# Patient Record
Sex: Female | Born: 1975
Health system: Southern US, Community
[De-identification: ages and names within clinical notes are randomized; demographics above are authoritative.]

## PROBLEM LIST (undated history)

## (undated) DIAGNOSIS — F329 Major depressive disorder, single episode, unspecified: Secondary | ICD-10-CM

## (undated) DIAGNOSIS — F32A Depression, unspecified: Secondary | ICD-10-CM

## (undated) DIAGNOSIS — K5790 Diverticulosis of intestine, part unspecified, without perforation or abscess without bleeding: Secondary | ICD-10-CM

## (undated) HISTORY — PX: ROTATOR CUFF REPAIR: SHX139

## (undated) HISTORY — PX: ABDOMINAL HYSTERECTOMY: SHX81

---

## 1999-11-04 ENCOUNTER — Emergency Department (HOSPITAL_COMMUNITY): Admission: EM | Admit: 1999-11-04 | Discharge: 1999-11-04 | Payer: Self-pay | Admitting: Emergency Medicine

## 2002-08-21 ENCOUNTER — Encounter: Admission: RE | Admit: 2002-08-21 | Discharge: 2002-08-21 | Payer: Self-pay | Admitting: Family Medicine

## 2002-08-28 ENCOUNTER — Encounter: Admission: RE | Admit: 2002-08-28 | Discharge: 2002-08-28 | Payer: Self-pay | Admitting: Family Medicine

## 2002-10-06 ENCOUNTER — Encounter: Admission: RE | Admit: 2002-10-06 | Discharge: 2002-10-06 | Payer: Self-pay | Admitting: Family Medicine

## 2002-10-15 ENCOUNTER — Ambulatory Visit (HOSPITAL_COMMUNITY): Admission: RE | Admit: 2002-10-15 | Discharge: 2002-10-15 | Payer: Self-pay | Admitting: Family Medicine

## 2002-10-23 ENCOUNTER — Inpatient Hospital Stay (HOSPITAL_COMMUNITY): Admission: AD | Admit: 2002-10-23 | Discharge: 2002-10-23 | Payer: Self-pay | Admitting: Obstetrics & Gynecology

## 2002-11-06 ENCOUNTER — Encounter: Admission: RE | Admit: 2002-11-06 | Discharge: 2002-11-06 | Payer: Self-pay | Admitting: Family Medicine

## 2002-11-12 ENCOUNTER — Inpatient Hospital Stay (HOSPITAL_COMMUNITY): Admission: AD | Admit: 2002-11-12 | Discharge: 2002-11-13 | Payer: Self-pay | Admitting: Obstetrics & Gynecology

## 2002-11-12 ENCOUNTER — Encounter: Payer: Self-pay | Admitting: Family Medicine

## 2002-12-04 ENCOUNTER — Encounter: Admission: RE | Admit: 2002-12-04 | Discharge: 2002-12-04 | Payer: Self-pay | Admitting: Family Medicine

## 2002-12-18 ENCOUNTER — Encounter: Admission: RE | Admit: 2002-12-18 | Discharge: 2002-12-18 | Payer: Self-pay | Admitting: Family Medicine

## 2002-12-25 ENCOUNTER — Encounter: Admission: RE | Admit: 2002-12-25 | Discharge: 2002-12-25 | Payer: Self-pay | Admitting: Sports Medicine

## 2003-01-04 ENCOUNTER — Encounter: Admission: RE | Admit: 2003-01-04 | Discharge: 2003-01-04 | Payer: Self-pay | Admitting: Family Medicine

## 2003-01-05 ENCOUNTER — Encounter: Admission: RE | Admit: 2003-01-05 | Discharge: 2003-01-05 | Payer: Self-pay | Admitting: Sports Medicine

## 2003-01-20 ENCOUNTER — Encounter: Admission: RE | Admit: 2003-01-20 | Discharge: 2003-01-20 | Payer: Self-pay | Admitting: Family Medicine

## 2003-02-01 ENCOUNTER — Encounter: Admission: RE | Admit: 2003-02-01 | Discharge: 2003-02-01 | Payer: Self-pay | Admitting: Family Medicine

## 2003-02-15 ENCOUNTER — Encounter: Admission: RE | Admit: 2003-02-15 | Discharge: 2003-02-15 | Payer: Self-pay | Admitting: Family Medicine

## 2003-02-23 ENCOUNTER — Encounter: Admission: RE | Admit: 2003-02-23 | Discharge: 2003-02-23 | Payer: Self-pay | Admitting: Family Medicine

## 2003-03-02 ENCOUNTER — Encounter: Admission: RE | Admit: 2003-03-02 | Discharge: 2003-03-02 | Payer: Self-pay | Admitting: Sports Medicine

## 2003-03-05 ENCOUNTER — Ambulatory Visit (HOSPITAL_COMMUNITY): Admission: RE | Admit: 2003-03-05 | Discharge: 2003-03-05 | Payer: Self-pay | Admitting: Family Medicine

## 2003-03-08 ENCOUNTER — Encounter: Admission: RE | Admit: 2003-03-08 | Discharge: 2003-03-08 | Payer: Self-pay | Admitting: Family Medicine

## 2003-03-16 ENCOUNTER — Encounter: Admission: RE | Admit: 2003-03-16 | Discharge: 2003-03-16 | Payer: Self-pay | Admitting: Sports Medicine

## 2003-03-18 ENCOUNTER — Inpatient Hospital Stay (HOSPITAL_COMMUNITY): Admission: AD | Admit: 2003-03-18 | Discharge: 2003-03-22 | Payer: Self-pay | Admitting: Obstetrics and Gynecology

## 2003-03-19 ENCOUNTER — Encounter (INDEPENDENT_AMBULATORY_CARE_PROVIDER_SITE_OTHER): Payer: Self-pay

## 2003-03-23 ENCOUNTER — Encounter: Admission: RE | Admit: 2003-03-23 | Discharge: 2003-03-23 | Payer: Self-pay | Admitting: Family Medicine

## 2003-03-25 ENCOUNTER — Encounter: Admission: RE | Admit: 2003-03-25 | Discharge: 2003-03-25 | Payer: Self-pay | Admitting: Family Medicine

## 2003-03-30 ENCOUNTER — Encounter: Admission: RE | Admit: 2003-03-30 | Discharge: 2003-03-30 | Payer: Self-pay | Admitting: Family Medicine

## 2003-03-31 ENCOUNTER — Encounter: Admission: RE | Admit: 2003-03-31 | Discharge: 2003-03-31 | Payer: Self-pay | Admitting: Family Medicine

## 2003-04-02 ENCOUNTER — Encounter: Admission: RE | Admit: 2003-04-02 | Discharge: 2003-04-02 | Payer: Self-pay | Admitting: Family Medicine

## 2003-04-05 ENCOUNTER — Encounter: Admission: RE | Admit: 2003-04-05 | Discharge: 2003-04-05 | Payer: Self-pay | Admitting: Family Medicine

## 2003-04-09 ENCOUNTER — Encounter: Admission: RE | Admit: 2003-04-09 | Discharge: 2003-05-09 | Payer: Self-pay | Admitting: Sports Medicine

## 2003-04-16 ENCOUNTER — Encounter: Admission: RE | Admit: 2003-04-16 | Discharge: 2003-04-16 | Payer: Self-pay | Admitting: Family Medicine

## 2003-05-03 ENCOUNTER — Encounter: Admission: RE | Admit: 2003-05-03 | Discharge: 2003-05-03 | Payer: Self-pay | Admitting: Family Medicine

## 2003-05-10 ENCOUNTER — Encounter: Admission: RE | Admit: 2003-05-10 | Discharge: 2003-06-09 | Payer: Self-pay | Admitting: Sports Medicine

## 2003-06-01 ENCOUNTER — Encounter: Admission: RE | Admit: 2003-06-01 | Discharge: 2003-06-01 | Payer: Self-pay | Admitting: Family Medicine

## 2003-10-11 ENCOUNTER — Encounter: Admission: RE | Admit: 2003-10-11 | Discharge: 2003-10-11 | Payer: Self-pay | Admitting: Family Medicine

## 2004-07-19 ENCOUNTER — Encounter (INDEPENDENT_AMBULATORY_CARE_PROVIDER_SITE_OTHER): Payer: Self-pay | Admitting: *Deleted

## 2004-07-19 ENCOUNTER — Ambulatory Visit: Payer: Self-pay | Admitting: Family Medicine

## 2004-08-08 ENCOUNTER — Ambulatory Visit: Payer: Self-pay | Admitting: Family Medicine

## 2004-08-22 ENCOUNTER — Ambulatory Visit: Payer: Self-pay | Admitting: Family Medicine

## 2005-02-28 ENCOUNTER — Ambulatory Visit: Payer: Self-pay | Admitting: Family Medicine

## 2005-02-28 ENCOUNTER — Encounter (INDEPENDENT_AMBULATORY_CARE_PROVIDER_SITE_OTHER): Payer: Self-pay | Admitting: *Deleted

## 2005-05-10 ENCOUNTER — Ambulatory Visit: Payer: Self-pay | Admitting: Family Medicine

## 2005-10-01 ENCOUNTER — Encounter (INDEPENDENT_AMBULATORY_CARE_PROVIDER_SITE_OTHER): Payer: Self-pay | Admitting: Specialist

## 2005-10-01 ENCOUNTER — Ambulatory Visit: Payer: Self-pay | Admitting: Family Medicine

## 2006-04-02 ENCOUNTER — Encounter (INDEPENDENT_AMBULATORY_CARE_PROVIDER_SITE_OTHER): Payer: Self-pay | Admitting: *Deleted

## 2006-04-02 LAB — CONVERTED CEMR LAB

## 2006-04-09 ENCOUNTER — Encounter (INDEPENDENT_AMBULATORY_CARE_PROVIDER_SITE_OTHER): Payer: Self-pay | Admitting: *Deleted

## 2006-04-09 ENCOUNTER — Ambulatory Visit: Payer: Self-pay | Admitting: Family Medicine

## 2006-04-24 ENCOUNTER — Ambulatory Visit: Payer: Self-pay | Admitting: Family Medicine

## 2006-05-23 DIAGNOSIS — G43909 Migraine, unspecified, not intractable, without status migrainosus: Secondary | ICD-10-CM | POA: Insufficient documentation

## 2006-05-23 DIAGNOSIS — E739 Lactose intolerance, unspecified: Secondary | ICD-10-CM | POA: Insufficient documentation

## 2006-05-24 ENCOUNTER — Encounter (INDEPENDENT_AMBULATORY_CARE_PROVIDER_SITE_OTHER): Payer: Self-pay | Admitting: *Deleted

## 2006-05-28 ENCOUNTER — Ambulatory Visit: Payer: Self-pay | Admitting: Family Medicine

## 2006-05-28 DIAGNOSIS — E669 Obesity, unspecified: Secondary | ICD-10-CM | POA: Insufficient documentation

## 2006-05-28 DIAGNOSIS — R5383 Other fatigue: Secondary | ICD-10-CM | POA: Insufficient documentation

## 2006-05-28 DIAGNOSIS — R030 Elevated blood-pressure reading, without diagnosis of hypertension: Secondary | ICD-10-CM | POA: Insufficient documentation

## 2006-05-28 DIAGNOSIS — R5381 Other malaise: Secondary | ICD-10-CM | POA: Insufficient documentation

## 2006-07-10 ENCOUNTER — Telehealth (INDEPENDENT_AMBULATORY_CARE_PROVIDER_SITE_OTHER): Payer: Self-pay | Admitting: *Deleted

## 2006-08-23 ENCOUNTER — Ambulatory Visit: Payer: Self-pay | Admitting: Family Medicine

## 2006-08-23 DIAGNOSIS — L659 Nonscarring hair loss, unspecified: Secondary | ICD-10-CM | POA: Insufficient documentation

## 2006-08-27 ENCOUNTER — Encounter: Payer: Self-pay | Admitting: *Deleted

## 2006-10-31 ENCOUNTER — Encounter (INDEPENDENT_AMBULATORY_CARE_PROVIDER_SITE_OTHER): Payer: Self-pay | Admitting: *Deleted

## 2006-12-24 ENCOUNTER — Telehealth: Payer: Self-pay | Admitting: *Deleted

## 2007-04-04 ENCOUNTER — Ambulatory Visit: Payer: Self-pay | Admitting: Family Medicine

## 2007-04-04 DIAGNOSIS — F321 Major depressive disorder, single episode, moderate: Secondary | ICD-10-CM | POA: Insufficient documentation

## 2007-04-07 ENCOUNTER — Encounter (INDEPENDENT_AMBULATORY_CARE_PROVIDER_SITE_OTHER): Payer: Self-pay | Admitting: *Deleted

## 2007-06-30 ENCOUNTER — Ambulatory Visit: Payer: Self-pay | Admitting: Family Medicine

## 2007-07-14 ENCOUNTER — Telehealth (INDEPENDENT_AMBULATORY_CARE_PROVIDER_SITE_OTHER): Payer: Self-pay | Admitting: *Deleted

## 2007-07-14 ENCOUNTER — Encounter (INDEPENDENT_AMBULATORY_CARE_PROVIDER_SITE_OTHER): Payer: Self-pay | Admitting: *Deleted

## 2007-07-14 ENCOUNTER — Ambulatory Visit: Payer: Self-pay | Admitting: Sports Medicine

## 2007-07-14 ENCOUNTER — Encounter: Payer: Self-pay | Admitting: *Deleted

## 2007-07-14 LAB — CONVERTED CEMR LAB: TSH: 1.205 microintl units/mL (ref 0.350–5.50)

## 2007-07-15 ENCOUNTER — Encounter (INDEPENDENT_AMBULATORY_CARE_PROVIDER_SITE_OTHER): Payer: Self-pay | Admitting: *Deleted

## 2007-12-10 ENCOUNTER — Ambulatory Visit: Payer: Self-pay | Admitting: Family Medicine

## 2007-12-10 ENCOUNTER — Telehealth: Payer: Self-pay | Admitting: *Deleted

## 2008-02-16 ENCOUNTER — Ambulatory Visit: Payer: Self-pay | Admitting: Family Medicine

## 2008-02-16 ENCOUNTER — Telehealth: Payer: Self-pay | Admitting: *Deleted

## 2008-05-12 ENCOUNTER — Telehealth (INDEPENDENT_AMBULATORY_CARE_PROVIDER_SITE_OTHER): Payer: Self-pay | Admitting: Family Medicine

## 2008-06-11 ENCOUNTER — Ambulatory Visit: Payer: Self-pay | Admitting: Family Medicine

## 2008-06-11 DIAGNOSIS — N898 Other specified noninflammatory disorders of vagina: Secondary | ICD-10-CM | POA: Insufficient documentation

## 2008-08-04 ENCOUNTER — Ambulatory Visit: Payer: Self-pay | Admitting: Family Medicine

## 2008-08-04 ENCOUNTER — Encounter: Payer: Self-pay | Admitting: Family Medicine

## 2008-08-04 DIAGNOSIS — R87610 Atypical squamous cells of undetermined significance on cytologic smear of cervix (ASC-US): Secondary | ICD-10-CM | POA: Insufficient documentation

## 2008-08-04 LAB — CONVERTED CEMR LAB
BUN: 14 mg/dL (ref 6–23)
CO2: 22 meq/L (ref 19–32)
Calcium: 8.9 mg/dL (ref 8.4–10.5)
Chlamydia, DNA Probe: NEGATIVE
Chloride: 104 meq/L (ref 96–112)
Creatinine, Ser: 0.78 mg/dL (ref 0.40–1.20)
GC Probe Amp, Genital: NEGATIVE
Glucose, Bld: 139 mg/dL — ABNORMAL HIGH (ref 70–99)
Potassium: 4.2 meq/L (ref 3.5–5.3)
Sodium: 140 meq/L (ref 135–145)

## 2008-08-13 ENCOUNTER — Encounter: Payer: Self-pay | Admitting: Family Medicine

## 2008-08-18 ENCOUNTER — Telehealth: Payer: Self-pay | Admitting: Family Medicine

## 2009-05-23 ENCOUNTER — Ambulatory Visit: Payer: Self-pay | Admitting: Family Medicine

## 2009-05-23 ENCOUNTER — Encounter: Payer: Self-pay | Admitting: Family Medicine

## 2009-05-23 LAB — CONVERTED CEMR LAB: Rapid Strep: NEGATIVE

## 2009-08-09 ENCOUNTER — Ambulatory Visit: Payer: Self-pay | Admitting: Family Medicine

## 2009-08-11 LAB — CONVERTED CEMR LAB: Pap Smear: HIGH

## 2009-08-18 ENCOUNTER — Ambulatory Visit: Payer: Self-pay | Admitting: Family Medicine

## 2009-08-18 DIAGNOSIS — R87613 High grade squamous intraepithelial lesion on cytologic smear of cervix (HGSIL): Secondary | ICD-10-CM | POA: Insufficient documentation

## 2009-08-24 ENCOUNTER — Telehealth: Payer: Self-pay | Admitting: Family Medicine

## 2009-08-25 ENCOUNTER — Encounter: Payer: Self-pay | Admitting: Family Medicine

## 2009-09-09 ENCOUNTER — Telehealth: Payer: Self-pay | Admitting: Family Medicine

## 2009-09-22 ENCOUNTER — Encounter: Payer: Self-pay | Admitting: Family Medicine

## 2009-10-27 ENCOUNTER — Encounter (INDEPENDENT_AMBULATORY_CARE_PROVIDER_SITE_OTHER): Payer: Self-pay | Admitting: Obstetrics and Gynecology

## 2009-10-27 ENCOUNTER — Ambulatory Visit (HOSPITAL_COMMUNITY): Admission: RE | Admit: 2009-10-27 | Discharge: 2009-10-28 | Payer: Self-pay | Admitting: Obstetrics and Gynecology

## 2009-12-21 ENCOUNTER — Encounter: Payer: Self-pay | Admitting: Family Medicine

## 2009-12-21 ENCOUNTER — Ambulatory Visit: Payer: Self-pay | Admitting: Family Medicine

## 2009-12-21 DIAGNOSIS — K644 Residual hemorrhoidal skin tags: Secondary | ICD-10-CM | POA: Insufficient documentation

## 2009-12-23 ENCOUNTER — Telehealth: Payer: Self-pay | Admitting: Family Medicine

## 2010-01-16 ENCOUNTER — Ambulatory Visit: Payer: Self-pay | Admitting: Family Medicine

## 2010-01-17 ENCOUNTER — Ambulatory Visit: Payer: Self-pay | Admitting: Family Medicine

## 2010-01-17 ENCOUNTER — Encounter: Payer: Self-pay | Admitting: Family Medicine

## 2010-01-17 LAB — CONVERTED CEMR LAB
Cholesterol: 204 mg/dL — ABNORMAL HIGH (ref 0–200)
HDL: 62 mg/dL (ref >39)
LDL Cholesterol: 115 mg/dL — ABNORMAL HIGH (ref 0–99)
Total CHOL/HDL Ratio: 3.3
Triglycerides: 136 mg/dL (ref <150)
VLDL: 27 mg/dL (ref 0–40)

## 2010-03-10 ENCOUNTER — Telehealth: Payer: Self-pay | Admitting: *Deleted

## 2010-04-25 NOTE — Progress Notes (Signed)
Summary: test results   Phone Note Call from Patient Call back at [phone]   Reason for Call: Lab or Test Results Summary of Call: pt is requesting results of colpo Initial call taken by: Knox Royalty,  August 24, 2009 2:58 PM  Follow-up for Phone Call        spoke w pt will refer gyn      Complete Medication List: 1)  Bupropion Hcl 150 Mg Xr24h-tab (Bupropion hcl) .... One by mouth in am

## 2010-04-25 NOTE — Progress Notes (Signed)
Summary: meds prob-medications completed   ------------------------------------------------------------------------------------------------------------------------------- Called and left message for patient that mediaction is ready for pickup. Doree Albee MD December 23, 2009 9:05 PM  Medications Added WELLBUTRIN XL 300 MG XR24H-TAB (BUPROPION HCL) 1 tab daily       Phone Note Refill Request Call back at Home Phone [phone] Message from:  Patient  Refills Requested: Medication #1:  BUPROPION HCL 150 MG XR24H-TAB one by mouth in AM didn't get meds from visit the other day, needs it today, please cream for hemmoroids [facility] [address]  Initial call taken by: De Nurse,  December 23, 2009 11:19 AM    New/Updated Medications: WELLBUTRIN XL 300 MG XR24H-TAB (BUPROPION HCL) 1 tab daily Prescriptions: WELLBUTRIN XL 300 MG XR24H-TAB (BUPROPION HCL) 1 tab daily  #30 x 6   Entered and Authorized by:   Doree Albee MD   Signed by:   Doree Albee MD on 12/23/2009   Method used:   Electronically to        [facility] [phone]* (retail)       [address]       [place], [place]  [address]       Ph: [phone]       Fax: [phone]   RxID:   [id number] HYDROCORTISONE 1 % CREA (HYDROCORTISONE) apply to affected area twice daily x 2 weeks  #1 tube qs x 0   Entered and Authorized by:   Doree Albee MD   Signed by:   Doree Albee MD on 12/23/2009   Method used:   Electronically to        [facility]. [phone]* (retail)       [address]       [place], [place]  [address]       Ph: [phone]       Fax: [phone]   RxID:   [id number]

## 2010-04-25 NOTE — Assessment & Plan Note (Signed)
Summary: sore throat,df   Vital Signs:  Patient profile:   35 year old female Weight:      220.9 pounds Temp:     98.2 degrees F oral Pulse rate:   90 / minute BP sitting:   119 / 85  (right arm)  Vitals Entered By: Arlyss Repress CMA, (May 23, 2009 10:19 AM) CC: sore throat since thursday Is Patient Diabetic? No Pain Assessment Patient in pain? no        Primary Care Provider:  Eustaquio Boyden  MD  CC:  sore throat since thursday.  History of Present Illness: patient c/o cold symptoms for two weeks, on thursday began with sore throat, pain with swallowing and dizziness.  Nonproductive cough that worsens at night associated with fever and chills that began on Saturday.  Has taken tylenol for fever, but has not taken any other medications.  Does not have a history of allergies or lung problems but does have hx of sinus infections.     Habits & Providers  Alcohol-Tobacco-Diet     Tobacco Status: never  Current Medications (verified): 1)  Mononessa 0.25-35 Mg-Mcg Tabs (Norgestimate-Eth Estradiol) .... 28tabs.  Take One By Mouth Daily As Directed 2)  Doxycycline Hyclate 100 Mg Caps (Doxycycline Hyclate) .... Take One Tab Two Times A Day By Mouth For Ten Days  Allergies (verified): 1)  ! Penicillin  Review of Systems General:  Complains of chills, fatigue, and fever; denies loss of appetite, malaise, and sweats. Eyes:  Denies discharge, eye irritation, and light sensitivity. ENT:  Complains of difficulty swallowing, sinus pressure, and sore throat; denies decreased hearing, ear discharge, earache, hoarseness, nasal congestion, nosebleeds, postnasal drainage, and ringing in ears. CV:  Denies chest pain or discomfort, difficulty breathing at night, lightheadness, palpitations, and shortness of breath with exertion. Resp:  Complains of cough; denies chest discomfort, chest pain with inspiration, shortness of breath, and sputum productive.  Physical Exam  General:   Well-developed,well-nourished,in no acute distress; alert,appropriate and cooperative throughout examination Eyes:  No corneal or conjunctival inflammation noted. EOMI. Perrla. Vision grossly normal. Ears:  External ear exam shows no significant lesions or deformities.  Otoscopic examination reveals clear canals, tympanic membranes are intact bilaterally without bulging, retraction, inflammation or discharge. Hearing is grossly normal bilaterally. Nose:  External nasal examination shows no deformity or inflammation. Nasal mucosa are pink and moist without lesions or exudates. Mouth:  Oral mucosa and oropharynx without lesions, exudate noted to left tonsil, bilateral tenderness.   Chest Wall:  No deformities, masses, or tenderness noted. Lungs:  Normal respiratory effort, chest expands symmetrically. Lungs are clear to auscultation, no crackles or wheezes. Heart:  Normal rate and regular rhythm. S1 and S2 normal without gallop, murmur, click, rub or other extra sounds. Neurologic:  alert & oriented X3.   Skin:  Intact without suspicious lesions or rashes Cervical Nodes:  L anterior LN tender.   Psych:  Oriented X3, memory intact for recent and remote, and normally interactive.     Impression & Recommendations:  Problem # 1:  PHARYNGITIS (ICD-462)  Exudate on tonsils and tenderness anterior left cervical lymph node, will treat for pharyngitis with patient instructions to return if symptoms are not improved. Her updated medication list for this problem includes:    Doxycycline Hyclate 100 Mg Caps (Doxycycline hyclate) .Marland Kitchen... Take one tab two times a day by mouth for ten days  Orders: [facility]- Est Level  3 ([address])  Complete Medication List: 1)  Mononessa 0.25-35 Mg-mcg Tabs (Norgestimate-eth  estradiol) .... 28tabs.  take one by mouth daily as directed 2)  Doxycycline Hyclate 100 Mg Caps (Doxycycline hyclate) .... Take one tab two times a day by mouth for ten days  Other Orders: Rapid Strep-[facility]  ([address])  Patient Instructions: 1)  Take antibiotics as prescribed. 2)  Get plenty of rest and fluids. 3)  May use throat spray of your choice. 4)  Return if symptoms persist or if worsen with treatment. Prescriptions: DOXYCYCLINE HYCLATE 100 MG CAPS (DOXYCYCLINE HYCLATE) take one tab two times a day by mouth for ten days  #20 x 0   Entered and Authorized by:   Luretha Murphy NP   Signed by:   Luretha Murphy NP on 05/23/2009   Method used:   Electronically to        [facility] [address] [phone]* (retail)       [address] [place], [place]  [address]       Ph: [phone]       Fax: [phone]   RxID:   [id number]   Laboratory Results  Date/Time Received: May 23, 2009 10:26 AM  Date/Time Reported: May 23, 2009 10:35 AM   Other Tests  Rapid Strep: negative Comments: ...........test performed by...........Marland KitchenTerese Door, CMA

## 2010-04-25 NOTE — Assessment & Plan Note (Signed)
Summary: meds/kh   Vital Signs:  Patient profile:   35 year old female Height:      66 inches Weight:      220.9 pounds BMI:     35.78 Temp:     98.0 degrees F oral Pulse rate:   92 / minute BP sitting:   122 / 87  (left arm) Cuff size:   regular  Vitals Entered By: Garen Grams LPN (December 21, 2009 1:52 PM) CC: f/u depression med, Depression Is Patient Diabetic? No Pain Assessment Patient in pain? no        Primary Care Provider:  Eustaquio Boyden  MD  CC:  f/u depression med and Depression.  History of Present Illness: 64 YOF w/ PMHx/o depression, hx/o HGSIL s/p hysterectomy here for new MD visit   Anal irritation: Pt reports anal irritation and pain since hysterectomy. Feels that it may be from hemorrhoids. Has done anal self check and has noticed "anal bumps" that were painful. Has used preparation H for pain with minimal improvement. No anal bleeding, BRBPR, hx/o inflammatory bowel disease, prolonged post-operative course per pt. Pain most prominent with exercising/siting for prolonged periods of time.   TAH: Pt s/p lap-hysterectomy 10/27/2009. Has had otherwise normal post-operative coarse to date. Ambulating well without incident/ no reported abdominal pain. Pt tentatively scheduled for followup in 1-2 months.    Depression History:      The patient denies a depressed mood most of the day and a diminished interest in her usual daily activities.  The patient denies insomnia, hypersomnia, fatigue (loss of energy), and feelings of worthlessness (guilt).        The patient denies that she feels like life is not worth living, denies that she wishes that she were dead, and denies that she has thought about ending her life.        Comments:  States that mood is improved from before starting wellbutrin but feels that overall boosted mood is beginning to wain. .   Habits & Providers  Alcohol-Tobacco-Diet     Tobacco Status: never  Allergies: 1)  ! Penicillin  Physical  Exam  General:  alert and well-developed.   Head:  normocephalic and atraumatic.   Nose:  no external deformity.   Mouth:  good dentition.   Neck:  supple and full ROM.   Lungs:  normal respiratory effort, normal breath sounds, and no wheezes.   Heart:  normal rate, regular rhythm, and no murmur.   Abdomen:  soft, non-tender, and normal bowel sounds.   Rectal:  external hemorrhoid(s), no internal hemorrhoids noted  Extremities:  2+ peripheral pulses, no edema  Neurologic:  alert & oriented X3.     Impression & Recommendations:  Problem # 1:  DEPRESSION, MAJOR, MODERATE (ICD-296.22) Assessment Deteriorated Plan to increase wellbutrin to max dosing to will plan to call pt in 2 weeks for mood check  as well as set up 1 month appt to reasses mood. Discussed psych red flags with patient (suicidality/homicidality). Pt agreeable to overall plan.   Orders: [facility]- Est  Level 4 ([address])  Problem # 2:  HEMORRHOIDS, EXTERNAL (ICD-455.3) Assessment: New Multiple small external hemorrhois noted on PE w/o  thrombosis. Plan to start pt on topical hydrocortisone cream for pain/inflammation. Uptodate patient handout on hemorrhoids also given to patient in terms of dietary and lifestyle changes. Addressed patient need for shorterm use of topical steroid (max 2 weeks of continued use) as to avoid secondary skin breakdown.   Orders: [facility]-  Est  Level 4 ([address])  Problem # 3:  PREVENTIVE HEALTH CARE (ICD-V70.0) Will order lipid panel in setting of baseline obesity.   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 Tolerated well with normal post operative course per patient. Tentaively scheduled for follow[facility] in the [facility] Will continue to follow.   Orders: [facility]- Est  Level 4 ([address])  Complete Medication List: 1)  Bupropion Hcl 150 Mg Xr24h-tab (Bupropion hcl) .... One by mouth in am 2)   Ortho-novum 1/35 (28) 1-35 Mg-mcg Tabs (Norethindrone-eth estradiol) .... Take one daily for birth control 3)  Hydrocortisone 1 % Crea (Hydrocortisone) .... Apply to affected area twice daily x 2 weeks  Other Orders: Future Orders: T-Lipid Profile ([address]11) ... 12/30/2009  Patient Instructions: 1)  It was good to meet you today 2)  We will be increasing your Buproprion for your mood 3)  You will also given a cream for your irritation(use as directed) 4)  as well as a patient handout 5)  Come back to see me in 11month about your mood 6)  Otherwise call for any questions 7)  God Bless,  8)  Doree Albee MD    Prevention & Chronic Care Immunizations   Influenza vaccine: Not documented    Tetanus booster: 06/24/2004: Done.   Tetanus booster due: 06/25/2014    Pneumococcal vaccine: Not documented  Other Screening   Pap smear: HIGH GRADE SQUAMOUS INTRAEPITHELIAL LESION: CIN-2/ VAIN-2/ CIN-3/ VAIN-3/CIS.  (08/09/2009)   Pap smear due: 08/04/2009   Smoking status: never  (12/21/2009)  Lipids   Total Cholesterol: Not documented   Lipid panel action/deferral: Lipid Panel ordered   LDL: Not documented   LDL Direct: Not documented   HDL: Not documented   Triglycerides: Not documented

## 2010-04-25 NOTE — Progress Notes (Signed)
Summary: phn msg  Medications Added ORTHO-NOVUM 1/35 (28) 1-35 MG-MCG TABS (NORETHINDRONE-ETH ESTRADIOL) take one daily for birth control       Phone Note Call from Patient Call back at Home Phone [phone]   Caller: Patient Summary of Call: Would like an rx called in for birth control.  Pharmacy is [facility] Initial call taken by: Clydell Hakim,  September 09, 2009 4:07 PM  Follow-up for Phone Call        called back to touch base - h/o not tolerating OCPs in past.  left message to call us back. Follow-up by: Eustaquio Boyden  MD,  September 12, 2009 10:26 AM  Additional Follow-up for Phone Call Additional follow up Details #1::        pt returned call Additional Follow-up by: De Nurse,  September 12, 2009 10:56 AM    Additional Follow-up for Phone Call Additional follow up Details #2::    spoke with patient.  pt would like ortho novum as she tolerated this well in the past.  sent to her pharmacy. Follow-up by: Eustaquio Boyden  MD,  September 12, 2009 1:37 PM  New/Updated Medications: ORTHO-NOVUM 1/35 (28) 1-35 MG-MCG TABS (NORETHINDRONE-ETH ESTRADIOL) take one daily for birth control Prescriptions: ORTHO-NOVUM 1/35 (28) 1-35 MG-MCG TABS (NORETHINDRONE-ETH ESTRADIOL) take one daily for birth control  #30 x 3   Entered and Authorized by:   Eustaquio Boyden  MD   Signed by:   Eustaquio Boyden  MD on 09/12/2009   Method used:   Electronically to        [facility] [phone]* (retail)       [address]       [place], [place]  [address]       Ph: [phone]       Fax: [phone]   RxID:   [id number]

## 2010-04-25 NOTE — Letter (Signed)
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 [facility] at [facility]. See attached note for full details. Doree Albee {DATETIMESTAMP()}

## 2010-04-25 NOTE — Assessment & Plan Note (Signed)
Summary: cpe/pap,tcb   Vital Signs:  Patient profile:   35 year old female Height:      66 inches Weight:      226 pounds BMI:     36.61 Pulse rate:   75 / minute BP sitting:   134 / 92  (right arm)  Vitals Entered By: Arlyss Repress CMA, (Aug 09, 2009 8:40 AM) CC: physical with pap Is Patient Diabetic? No Pain Assessment Patient in pain? no        Primary Care Provider:  Eustaquio Boyden  MD  CC:  physical with pap.  History of Present Illness: CC: cpe  1. BC - would like IUD again.  Off OCPs.  Fiance has vasectomy.  Would still like IUD to help regulate periods.  Had IUD several years ago but got dislodged so removed.  Tolerated well in past.  LMP 1 wk ago.  Usually heavy for 5 days.  2. pap - h/o abnormal paps in past, had LEEP 1990s, colposcopy 2005 with biopsy.  Last year pap returned ASCUS, no high risk HPV detected.  3. depression - previously on wellbutrin, worked well.  then stopped because taknig migraine meds, not anymore and would like to restart wellbutrin.  States depression returning.  Going through body issues and feeling down.  Habits & Providers  Alcohol-Tobacco-Diet     Tobacco Status: never  Current Medications (verified): 1)  Bupropion Hcl 150 Mg Xr24h-Tab (Bupropion Hcl) .... One By Mouth in Am  Allergies (verified): 1)  ! Penicillin  Past History:  Past medical, surgical, family and social histories (including risk factors) reviewed for relevance to current acute and chronic problems.  Past Medical History: Reviewed history from 12/10/2007 and no changes required. 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 none  Family History: Reviewed history from 08/04/2008 and no changes required. Diabetes in father and grandfather  Social History: Reviewed history from 08/04/2008 and  no changes required. Works as Associate Professor at [employer].  Exercises 3d/wk - aquatic aerobics and walking.  No tobacco or drugs. Occasional EtOH.  Cute Son Darene Lamer DOB 2004.  Lives with son and small dog.  feels safe at home.  Physical Exam  General:  Well-developed,well-nourished,in no acute distress; alert,appropriate and cooperative throughout examination Genitalia:  Pelvic Exam:        External: normal female genitalia without lesions or masses        Vagina: normal without lesions or masses        Cervix: normal without lesions or masses        Adnexa: normal bimanual exam without masses or fullness        Uterus: normal by palpation        Pap smear: performed   Impression & Recommendations:  Problem # 1:  SCREENING FOR MALIGNANT NEOPLASM OF THE CERVIX (ICD-V76.2)  Orders: Pap Smear-[facility] ([id number]) [facility] - Est  18-39 yrs ([address])  Problem # 2:  DEPRESSION, MAJOR, MODERATE (ICD-296.22)  restart wellbutrin.  return in 1 month for f/u.  Orders: [facility] - Est  18-39 yrs ([address])  Problem # 3:  FAMILY PLANNING (ICD-V25.09) would like IUD to help regularize periods.  did not tolerate OCPs.  Husband with vasectomy.  advised to schedule appt at [facility] clinic.  Complete Medication List: 1)  Bupropion Hcl 150 Mg Xr24h-tab (Bupropion hcl) .... One by mouth in  am  Patient Instructions: 1)  I will call you with results at [phone] of pap smear. 2)  Everything looked normal today.  If pap returns normal, we will need to repeat in 1 year.  If abnormal, we will send you for colposcopy. 3)  Schedule appointment at [facility] clinic for IUD insertion. 4)  Start Wellbutrin daily (prescription sent to [facility] [address]). 5)  Good to see you today. Prescriptions: BUPROPION HCL 150 MG XR24H-TAB (BUPROPION HCL) one by mouth in AM  #31 x 3   Entered and Authorized by:   Eustaquio Boyden  MD   Signed by:   Eustaquio Boyden  MD on 08/09/2009   Method used:   Electronically to         [facility]. [phone]* (retail)       [address]       [place], [place]  [address]       Ph: [phone]       Fax: [phone]   RxID:   [id number]   Appended Document: cpe/pap,tcb     Clinical Lists Changes  Observations: Added new observation of PEADULT: Eustaquio Boyden  MD ~Breasts`Breast Insp (08/09/2009 9:25) Added new observation of BREAST INSP: No mass, nodules, thickening, tenderness, bulging, retraction, inflamation, nipple discharge or skin changes noted.   (08/09/2009 9:25)        Physical Exam  Breasts:  No mass, nodules, thickening, tenderness, bulging, retraction, inflamation, nipple discharge or skin changes noted.

## 2010-04-25 NOTE — Letter (Signed)
Summary: Out of Work  [facility]  [address]   [place], [place] [address]   Phone: [phone]  Fax: [phone]    May 23, 2009   Employee:  LAYLANA GERWIG    To Whom It May Concern:   For Medical reasons, please excuse the above named employee from work for the following dates:  Start:    End:    If you need additional information, please feel free to contact our office.         Sincerely,    Luretha Murphy NP

## 2010-04-25 NOTE — Miscellaneous (Signed)
   Clinical Lists Changes  Medications: Rx of DOXYCYCLINE HYCLATE 100 MG CAPS (DOXYCYCLINE HYCLATE) take one tab two times a day by mouth for ten days;  #20 x 0;  Signed;  Entered by: Luretha Murphy NP;  Authorized by: Luretha Murphy NP;  Method used: Electronically to [facility]. [phone]*, [address] [place], [place]  [address], Ph: [phone], Fax: [phone]    Prescriptions: DOXYCYCLINE HYCLATE 100 MG CAPS (DOXYCYCLINE HYCLATE) take one tab two times a day by mouth for ten days  #20 x 0   Entered and Authorized by:   Luretha Murphy NP   Signed by:   Luretha Murphy NP on 05/23/2009   Method used:   Electronically to        [facility] [address] [phone]* (retail)       [address] [place], [place]  [address]       Ph: [phone]       Fax: [phone]   RxID:   [id number]

## 2010-04-25 NOTE — Miscellaneous (Signed)
Summary: Operative Report- Laparoscopic assisted vaginal hysterctomy    DATE OF PROCEDURE:  10/27/2009   DATE OF DISCHARGE:                                  OPERATIVE REPORT      PREOPERATIVE DIAGNOSIS:  Recurrent cervical dysplasia.      POSTOPERATIVE DIAGNOSIS:  Recurrent cervical dysplasia.      PROCEDURES:   1. Laparoscopic-assisted vaginal hysterectomy.   2. Lysis of adhesion.      SURGEON:  Michelle L. Vincente Poli, MD      ASSISTANTLuvenia Redden, MD      ANESTHESIA:  General.      Adhesion of left ovary to the posterior wall of the uterus.      SPECIMENS:  Uterus and cervix sent to Pathology.      ESTIMATED BLOOD LOSS:  200 mL.      COMPLICATIONS:  None.      DESCRIPTION OF PROCEDURE:  The patient was taken to the operating room.   She was then intubated.  She was prepped and draped.  A Foley catheter   was inserted and draining clear urine and the uterine manipulator was   inserted.  Attention was turned to the abdomen where a small incision   was made at the umbilicus.  The Veress needle was inserted and the   pneumoperitoneum was performed.  The 11-mm trocar was then inserted.   The laparoscope was introduced through the trocar sheaths.  Abdomen   appeared normal.  The patient was placed in Trendelenburg position and 5-   mm port was placed suprapubically under direct visualization.  Exam   revealed the following, no endometriosis, there was a small adhesion   involving the ovary to the posterior wall of the uterus which was   clipped with scissors.  We then grasped the triple pedicle, placed on   the right side, and placed the EnSeal seal instrument and burned that   and carried it down to the round ligament on the right side with   excellent hemostasis that was done on the left side in similar fashion.   The hemostasis was excellent.  We then removed the instruments, reduced   the pneumoperitoneum, kept the trocars in, went down to the vagina,   placed a  weighted speculum in the vagina.  The cervix was deep in the   vagina.  A circumferential incision was made around the cervix.  The   posterior cul-de-sac was entered sharply using Mayo scissors.  An   anterior cul-de-sac was entered sharply using Metzenbaum scissors.   Curved Heaney clamps were placed across uterosacral cardinal ligament.   Each pedicle was clamped, cut, and suture ligated using 0 Vicryl suture.   We carefully walked up the broad ligament, staying snug beside the   cervix and the uterus.  Each pedicle was clamped, cut, and suture   ligated using 0 Vicryl suture.  Once we reached the level of the fundus,   the uterus was retroflexed, remainder of the broad ligament was clamped   on either side.  Each pedicle was secured using a suture ligature of 0   Vicryl suture.  The specimen was removed and identified the cervix and   uterus.  The posterior cuff was closed in a running stitch using 0   Vicryl suture.  Hemostasis was very good.  We saw no bleeding  from the   peritoneal cavity and the cuff was then closed front to back, anterior   to posterior using 0 Vicryl suture.  At this point, urine output was   noted to be clear.  We then went back up to the abdomen.  We replaced   the pneumoperitoneum, irrigated the pelvis.  Hemostasis was very good.   I placed a piece of Interceed across the vaginal cuff to minimize   adhesion formation, but she had some adhesions at the time of the   surgery.  Hemostasis was excellent.  We reduced the pneumoperitoneum,   reserved the pelvis.  There was no bleeding noted.  All instruments were   removed from the abdominal cavity.  Local was infiltrated at each   incision site.  A 3-0 Vicryl was placed at each incision and Dermabond   was placed at each incision site.  All sponge, lap, and instrument   counts were correct x2.  The patient was extubated and went to recovery   room in stable condition.               Michelle L. Vincente Poli, M.D.

## 2010-04-25 NOTE — Miscellaneous (Signed)
Summary: Procedure Consent  Procedure Consent   Imported By: Bradly Bienenstock 08/25/2009 11:57:16  _____________________________________________________________________  External Attachment:    Type:   Image     Comment:   External Document

## 2010-04-25 NOTE — Letter (Signed)
Summary: Out of Work  [facility]  [address]   [place], [place] [address]   Phone: [phone]  Fax: [phone]    May 23, 2009   Employee:  LEONTINA SKIDMORE    To Whom It May Concern:   For Medical reasons, please excuse the above named employee from work for the following dates:  Start:   05-23-09  End:   05-25-09  If you need additional information, please feel free to contact our office.         Sincerely,    Luretha Murphy NP

## 2010-04-25 NOTE — Assessment & Plan Note (Signed)
Summary: colpo,df   Vital Signs:  Patient profile:   35 year old female Height:      66 inches Weight:      219.2 pounds Temp:     98.1 degrees F oral Pulse rate:   76 / minute BP sitting:   128 / 84  (left arm) Cuff size:   large  Vitals Entered By: Gladstone Pih (Aug 18, 2009 10:52 AM) CC: Colpo Is Patient Diabetic? No Pain Assessment Patient in pain? no        Primary Care Provider:  Eustaquio Boyden  MD  CC:  Colpo.  History of Present Illness: hx LEEP in 1990s Ascus pap last year neg for hi risk HPV HGSIL this year G1P1 non smoker  Habits & Providers  Alcohol-Tobacco-Diet     Tobacco Status: never  Allergies: 1)  ! Penicillin  Physical Exam  Genitalia:  thin white vaginal d/cnormal introitus, no external lesions, and mucosa pink and moist.   Additional Exam:  Patient given informed consent, signed copy in the chart. Placed in lithotomy position. Cervix viewed with speculum and colposcope. Was the entire squamocolumnar junction seen? given hx of prior LEEP, I see a ring of apparent columnar tissue tat I believe is the SCJ but ut was not initially apparent until application of acetic acid Any acetowhite lesions noted?yes 7:00 Any abnormalities seen with green filter?question of some slight cobblestoning see 6-7 area Any abnormalities seen with application of Lugol's solution?none different from above Was the endocervical canal sampled?yes with cytobrush Were any cervical biopsies taken?ys two Were there any complications?no--minimal bleeding stopped with application of Monsels COMMENTS:Used vaginal side wall retractors Patient was given post procedure instructions.    Impression & Recommendations:  Problem # 1:  PAP SMER CERV W/HI GRADE SQUAMOUS INTRAEPITH LES (ICD-795.04)  colposcopy with bhiopsies [phone] given hx of prior LEEP, feel certain she will need GYN referral; will contact her after path comes back  Orders: Colposcopy w/ biopsy -  [facility] ([id number])  Complete Medication List: 1)  Bupropion Hcl 150 Mg Xr24h-tab (Bupropion hcl) .... One by mouth in am  Appended Document: colpo,df

## 2010-04-27 NOTE — Progress Notes (Signed)
   Phone Note Call from Patient   Caller: Patient Summary of Call: Please fax flu shot and labs to ext [phone] so patient can give to her supervisort to be turned in to HR. Initial call taken by: Abundio Miu,  March 10, 2010 3:02 PM  Follow-up for Phone Call        called pt. faxed flu shot info and mailed lab report to pt's address. Follow-up by: Arlyss Repress CMA,,  March 13, 2010 12:31 PM

## 2010-04-27 NOTE — Assessment & Plan Note (Signed)
Summary: f/u on mood   Vital Signs:  Patient profile:   35 year old female Height:      66 inches Weight:      217 pounds Pulse rate:   88 / minute BP sitting:   130 / 90  (left arm) Cuff size:   regular  Vitals Entered By: Arlyss Repress CMA, (January 16, 2010 1:33 PM) CC: f/up depression. feels better. verbal consent for VP obtained., Depression Is Patient Diabetic? No Pain Assessment Patient in pain? no        CC:  f/up depression. feels better. verbal consent for VP obtained. and Depression.  Depression History:      The patient comes in today for a maintenance visit due to her depression.  The patient denies a depressed mood most of the day and a diminished interest in her usual daily activities.  The patient denies significant weight loss, significant weight gain, insomnia, hypersomnia, psychomotor agitation, psychomotor retardation, fatigue (loss of energy), feelings of worthlessness (guilt), impaired concentration (indecisiveness), and recurrent thoughts of death or suicide.        The patient denies that she feels like life is not worth living.        Comments:  Overall mood is much improved per pt. Has increased energy, improved concentration. Sleep also much improved. Now has the energy to go exercise. Going to gym 3-5 times per week. .   Habits & Providers  Alcohol-Tobacco-Diet     Tobacco Status: never     Passive Smoke Exposure: yes  Allergies: 1)  ! Penicillin  Social History: Passive Smoke Exposure:  yes  Physical Exam  General:  alert, well-developed, and healthy-appearing.   Head:  normocephalic and atraumatic.   Eyes:  vision grossly intact.   Ears:  R ear normal and L ear normal.   Nose:  no external deformity.   Mouth:  good dentition.   Neck:  supple and full ROM.   Lungs:  normal respiratory effort.   Heart:  normal rate, regular rhythm, and no murmur.   Abdomen:  soft, non-tender, and normal bowel sounds.   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 WIll continue with current regimen. Plan to followup in 3-6 months w/ intermittent phone contacts to follow up on mood. Depressive red flags reviewed extensively with patient. Pt agreeable to overall plan.  Orders: [facility]- Est Level  3 (11914)  Problem # 2:  PREVENTIVE HEALTH CARE (ICD-V70.0) Plan to obtain lipid panel. Also encouraged patient to continue exercise regimen and continue high fruit and vegetable diet. Overall 4lb weight loss since last visit. Re-encouraged physical activity and overall lifestyle modification plan. Will consider nutrition referral at next clinic visit.   Problem # 3:  PAP SMER CERV W/HI GRADE SQUAMOUS INTRAEPITH LES (ICD-795.04) Resolved s/p hysterectomy.   Complete Medication List: 1)  Wellbutrin Xl 300 Mg Xr24h-tab (Bupropion hcl) .Marland Kitchen.. 1 tab daily 2)  Ortho-novum 1/35 (28) 1-35 Mg-mcg Tabs (Norethindrone-eth estradiol) .... Take one daily for birth control 3)  Hydrocortisone 1 % Crea (Hydrocortisone) .... Apply to affected area twice daily x 2 weeks  Patient Instructions: 1)  It was good to see you again today  2)  Youre mood is doing much better 3)  Come back at your earliest convenience to have your lipids checked 4)  I will be calling periodically over  the next few months to make sure that you are doing ok 5)  Otherwise come back to see me in 3-6 months 6)  Call with any questions 7)  God Bless,  8)  Doree Albee MD    Orders Added: 1)  [facility]- Est Level  3 [[id number]]     Prevention & Chronic Care Immunizations   Influenza vaccine: Not documented    Tetanus booster: 06/24/2004: Done.   Tetanus booster due: 06/25/2014    Pneumococcal vaccine: Not documented  Other Screening   Pap smear: HIGH GRADE SQUAMOUS INTRAEPITHELIAL LESION: CIN-2/ VAIN-2/ CIN-3/ VAIN-3/CIS.  (08/09/2009)   Pap  smear due: 08/04/2009   Smoking status: never  (01/16/2010)  Lipids   Total Cholesterol: Not documented   Lipid panel action/deferral: Lipid Panel ordered   LDL: Not documented   LDL Direct: Not documented   HDL: Not documented   Triglycerides: Not documented   Nursing Instructions: Give Flu vaccine today   Appended Document: f/u on mood     Allergies: 1)  ! Penicillin   Complete Medication List: 1)  Ortho-novum 1/35 (28) 1-35 Mg-mcg Tabs (Norethindrone-eth estradiol) .... Take one daily for birth control 2)  Hydrocortisone 1 % Crea (Hydrocortisone) .... Apply to affected area twice daily x 2 weeks  Other Orders: Influenza Vaccine NON MCR ([id number])   Orders Added: 1)  Influenza Vaccine NON MCR [[id number]]   Immunizations Administered:  Influenza Vaccine # 1:    Vaccine Type: Fluvax Non-MCR    Site: left deltoid    Mfr: GlaxoSmithKline    Dose: 0.5 ml    Route: IM    Given by: Arlyss Repress CMA,    Exp. Date: 09/23/2010    Lot #: [id number]    VIS given: 10/18/09 version given March 13, 2010.  Flu Vaccine Consent Questions:    Do you have a history of severe allergic reactions to this vaccine? no    Any prior history of allergic reactions to egg and/or gelatin? no    Do you have a sensitivity to the preservative Thimersol? no    Do you have a past history of Guillan-Barre Syndrome? no    Do you currently have an acute febrile illness? no    Have you ever had a severe reaction to latex? no    Vaccine information given and explained to patient? yes    Are you currently pregnant? no   Immunizations Administered:  Influenza Vaccine # 1:    Vaccine Type: Fluvax Non-MCR    Site: left deltoid    Mfr: GlaxoSmithKline    Dose: 0.5 ml    Route: IM    Given by: Arlyss Repress CMA,    Exp. Date: 09/23/2010    Lot #: [id number]    VIS given: 10/18/09 version given March 13, 2010.

## 2010-05-01 ENCOUNTER — Telehealth: Payer: Self-pay | Admitting: *Deleted

## 2010-05-04 ENCOUNTER — Encounter: Payer: Self-pay | Admitting: *Deleted

## 2010-05-11 NOTE — Progress Notes (Signed)
Summary: phn msg   Phone Note Call from Patient Call back at Home Phone [phone] Call back at Work Phone [phone]   Caller: Patient Summary of Call: pt is going to [employer] and they are telling her that she needs another TD booster - got one [facility] [employer] last July, and is not sure why they are telling her she needs another one. she will be at work after 1:00 Initial call taken by: De Nurse,  May 01, 2010 12:14 PM  Follow-up for Phone Call        advised her to give the school a copy of the one she had from [employer]. states they want a copy of the one she had in 2006. told her I will try to locate it & fax it to the school [phone] attn: Darl Pikes Follow-up by: Golden Circle RN,  May 01, 2010 1:32 PM    Protocol "USPS Age 60-39 Female" :      Female patients with an age of greater than 25 years, and less than 39 years.       Should have the following:  Test             Schedule                Last Done   Last Rslt  Status  ---------------- ----------------------- ----------- ---------- ------------  TD BOOSTER       Every 10 years          06/24/2004  Done.      Due On: 06/25/2014  faxed it to the school.Golden Circle RN  May 01, 2010 2:08 PM

## 2010-05-31 ENCOUNTER — Encounter: Payer: Self-pay | Admitting: Family Medicine

## 2010-05-31 ENCOUNTER — Ambulatory Visit (INDEPENDENT_AMBULATORY_CARE_PROVIDER_SITE_OTHER): Payer: Commercial Managed Care - PPO | Admitting: Family Medicine

## 2010-05-31 VITALS — BP 140/98 | HR 80 | Temp 97.7°F | Ht 66.5 in | Wt 221.7 lb

## 2010-05-31 DIAGNOSIS — R87610 Atypical squamous cells of undetermined significance on cytologic smear of cervix (ASC-US): Secondary | ICD-10-CM

## 2010-05-31 DIAGNOSIS — F32A Depression, unspecified: Secondary | ICD-10-CM

## 2010-05-31 DIAGNOSIS — E669 Obesity, unspecified: Secondary | ICD-10-CM

## 2010-05-31 DIAGNOSIS — F3289 Other specified depressive episodes: Secondary | ICD-10-CM

## 2010-05-31 DIAGNOSIS — F321 Major depressive disorder, single episode, moderate: Secondary | ICD-10-CM

## 2010-05-31 DIAGNOSIS — F329 Major depressive disorder, single episode, unspecified: Secondary | ICD-10-CM

## 2010-05-31 MED ORDER — BUPROPION HCL ER (SR) 150 MG PO TB12: 150.0000 mg | ORAL_TABLET | Freq: Two times a day (BID) | ORAL | Status: DC

## 2010-05-31 MED ORDER — SERTRALINE HCL 50 MG PO TABS: 50.0000 mg | ORAL_TABLET | Freq: Every day | ORAL | Status: DC

## 2010-05-31 NOTE — Assessment & Plan Note (Signed)
Resolved s/p TAH 01/2010.

## 2010-05-31 NOTE — Progress Notes (Signed)
  Subjective:    Patient ID: Hailey Hopkins, female    DOB: 01-15-1976, 35 y.o.   MRN: [id number]  HPI: Mood Follow up--> Mood: Pt states that mood has been relatively well controled on wellbutrin 300mg  daily (was increased from 150mg  to 300mg  11/2009). However pt reports worsening in baseline mood since starting of spring semester at [employer]. Pt describes mainly as anxiety related to testing as well as worsening in generalized fatigue. Test anxiety has also been affecting her grade making per pt.  No HI/SI. Feels that she has a "full plate" as she is also a single mom. Sleep has been regular per pt.    Review of Systems     Objective:   Physical Exam  Constitutional: She appears well-developed and well-nourished. No distress.  Cardiovascular: Normal rate, regular rhythm and normal heart sounds.   Pulmonary/Chest: Effort normal and breath sounds normal.  Psychiatric: She has a normal mood and affect. Her behavior is normal.       Good eye contact            Assessment & Plan:

## 2010-05-31 NOTE — Assessment & Plan Note (Signed)
Will address this at next visit in terms of diet and lifestyle changes and likely referral to nutrition.

## 2010-05-31 NOTE — Patient Instructions (Signed)
Anxiety and Panic Attacks Your caregiver has informed you that you are having an anxiety or panic attack. There may be many forms of this. Most of the time these attacks come suddenly and without warning. They come at any time of day, including periods of sleep, and at any time of life. They may be strong and unexplained. Although panic attacks are very scary, they are physically harmless. Sometimes the cause of your anxiety is not known. Anxiety is a protective mechanism of the body in its fight or flight mechanism. Most of these perceived danger situations are actually nonphysical situations (such as anxiety over losing a job). CAUSES The causes of an anxiety or panic attack are many. Panic attacks may occur in otherwise healthy people given a certain set of circumstances. There may be a genetic cause for panic attacks. Some medications may also have anxiety as a side effect. SYMPTOMS Some of the most common feelings are:  Intense terror.  Dizziness, feeling faint.   Hot and cold flashes.   Fear of going crazy.   Feelings that nothing is real.   Sweating.   Shaking.   Chest pain or a fast heartbeat (palpitations).  Smothering, choking sensations.   Feelings of impending doom and that death is near.   Tingling of extremities, this may be from over breathing.   Altered reality (derealization).   Being detached from yourself (depersonalization).   Several symptoms can be present to make up anxiety or panic attacks. DIAGNOSIS The evaluation by your caregiver will depend on the type of symptoms you are experiencing. The diagnosis of anxiety or pain attack is made when no physical illness can be determined to be a cause of the symptoms. TREATMENT Treatment to prevent anxiety and panic attacks may include:  Avoidance of circumstances that cause anxiety.   Reassurance and relaxation.   Regular exercise.   Relaxation therapies, such as yoga.   Psychotherapy with a psychiatrist  or therapist.   Avoidance of caffeine, alcohol and illegal drugs.   Prescribed medication.  SEEK IMMEDIATE MEDICAL CARE IF:  You experience panic attack symptoms that are different than your usual symptoms.   You have any worsening or concerning symptoms.  Document Released: 03/12/2005 Document Re-Released: 08/30/2009 [facility] Patient Information 2011 [place], [place].Depression, Adolescent and Adult Depression is a true and treatable medical condition. In general there are two kinds of depression:  Depression we all experience in some form. For example depression from the death of a loved one, financial distress or natural disasters will trigger or increase depression.   Clinical depression, on the other hand, appears without an apparent cause or reason. This depression is a disease. Depression may be caused by chemical imbalance in the body and brain or may come as a response to a physical illness. Alcohol and other drugs can cause depression.  DIAGNOSIS  The diagnosis of depression is usually based upon symptoms and medical history. TREATMENT Treatments for depression fall into three categories. These are:  Drug therapy. There are many medicines that treat depression. Responses may vary and sometimes trial and error is necessary to determine the best medicines and dosage for a particular patient.   Psychotherapy, also called talking treatments, helps people resolve their problems by looking at them from a different point of view and by giving people insight into their own personal makeup. Traditional psychotherapy looks at a childhood source of a problem. Other psychotherapy will look at current conflicts and move toward solving those. If the cause of depression  is drug use, counseling is available to help abstain. In time the depression will usually improve. If there were underlying causes for the chemical use, they can be addressed.   ECT (electroconvulsive therapy) or shock treatment  is not as commonly used today. It is a very effective treatment for severe suicidal depression. During ECT electrical impulses are applied to the head. These impulses cause a generalized seizure. It can be effective but causes a loss of memory for recent events. Sometimes this loss of memory may include the last several months.  Treat all depression or suicide threats as serious. Obtain professional help. Do not wait to see if serious depression will get better over time without help. Seek help for yourself or those around you. In the [place] the number to the National Suicide Help Lines With 24 Hour Help Are: 1-800-SUICIDE 719-804-4258 Document Released: 03/09/2000 Document Re-Released: 06/08/2008 [facility] Patient Information 2011 [place], [place].

## 2010-05-31 NOTE — Assessment & Plan Note (Signed)
Deteriorated. Will add on zoloft as well as set up pt for psychotherapy. Hopefully this combination will help in mood and school performance. Will follow up in 2 weeks to reassess mood. May consider formal referral to psychiatry if full dose zoloft titration, psychotherapy, and wellbutrin are ineffective.

## 2010-06-09 LAB — CBC
HCT: 33.6 % — ABNORMAL LOW (ref 36.0–46.0)
Hemoglobin: 11.4 g/dL — ABNORMAL LOW (ref 12.0–15.0)
MCH: 31.6 pg (ref 26.0–34.0)
MCHC: 34 g/dL (ref 30.0–36.0)
MCV: 93 fL (ref 78.0–100.0)
Platelets: 251 10*3/uL (ref 150–400)
RBC: 3.61 MIL/uL — ABNORMAL LOW (ref 3.87–5.11)
RDW: 12.9 % (ref 11.5–15.5)
WBC: 12 10*3/uL — ABNORMAL HIGH (ref 4.0–10.5)

## 2010-06-09 LAB — PREGNANCY, URINE: Preg Test, Ur: NEGATIVE

## 2010-06-10 LAB — CBC
HCT: 41.7 % (ref 36.0–46.0)
Hemoglobin: 14.2 g/dL (ref 12.0–15.0)
MCH: 31.4 pg (ref 26.0–34.0)
MCHC: 34 g/dL (ref 30.0–36.0)
MCV: 92.2 fL (ref 78.0–100.0)
Platelets: 294 10*3/uL (ref 150–400)
RBC: 4.53 MIL/uL (ref 3.87–5.11)
RDW: 12.9 % (ref 11.5–15.5)
WBC: 6.7 10*3/uL (ref 4.0–10.5)

## 2010-06-10 LAB — BASIC METABOLIC PANEL
BUN: 7 mg/dL (ref 6–23)
CO2: 23 mEq/L (ref 19–32)
Calcium: 9.3 mg/dL (ref 8.4–10.5)
Chloride: 107 mEq/L (ref 96–112)
Creatinine, Ser: 0.94 mg/dL (ref 0.4–1.2)
GFR calc Af Amer: >60 mL/min (ref >60)
GFR calc non Af Amer: >60 mL/min (ref >60)
Glucose, Bld: 99 mg/dL (ref 70–99)
Potassium: 4.1 mEq/L (ref 3.5–5.1)
Sodium: 137 mEq/L (ref 135–145)

## 2010-06-10 LAB — SURGICAL PCR SCREEN
MRSA, PCR: NEGATIVE
Staphylococcus aureus: NEGATIVE

## 2010-06-16 ENCOUNTER — Encounter: Payer: Self-pay | Admitting: Family Medicine

## 2010-06-16 ENCOUNTER — Ambulatory Visit (INDEPENDENT_AMBULATORY_CARE_PROVIDER_SITE_OTHER): Payer: Commercial Managed Care - PPO | Admitting: Family Medicine

## 2010-06-16 VITALS — BP 130/80 | HR 84 | Ht 66.5 in | Wt 211.0 lb

## 2010-06-16 DIAGNOSIS — F329 Major depressive disorder, single episode, unspecified: Secondary | ICD-10-CM

## 2010-06-16 DIAGNOSIS — F3289 Other specified depressive episodes: Secondary | ICD-10-CM

## 2010-06-16 DIAGNOSIS — F32A Depression, unspecified: Secondary | ICD-10-CM

## 2010-06-16 DIAGNOSIS — F321 Major depressive disorder, single episode, moderate: Secondary | ICD-10-CM

## 2010-06-16 DIAGNOSIS — E669 Obesity, unspecified: Secondary | ICD-10-CM

## 2010-06-16 DIAGNOSIS — M549 Dorsalgia, unspecified: Secondary | ICD-10-CM

## 2010-06-16 MED ORDER — SERTRALINE HCL 100 MG PO TABS: ORAL_TABLET | ORAL | Status: DC

## 2010-06-16 NOTE — Assessment & Plan Note (Signed)
mild paraspinal muscle strain. Back exercises discussed and handout given. Also told pt to use ibuprofen prn for pain.

## 2010-06-16 NOTE — Assessment & Plan Note (Signed)
Overall weight trending down in setting of diet and exercise modification. Encouraged pt to continue progress. Noted 10lb weight loss since last visit. Will follow up in 2 weeks

## 2010-06-16 NOTE — Assessment & Plan Note (Signed)
Overall improved per pt. Will increase zoloft to 100mg  daily w/ planned follow up in 2 weeks.

## 2010-06-16 NOTE — Progress Notes (Signed)
  Subjective:    Patient ID: Hailey Hopkins, female    DOB: Aug 17, 1975, 35 y.o.   MRN: [id number]  HPI Mood: Much improved s/p wellbutrin and zoloft. 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 . Test anxiety is still present per pt, but is much improved in terms of performance and overall functionality at school.   Back Pain: Upper paraspinal back pain x 3-4 weeks per pt. Pt unsure of exact mechanism of injury. 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 No radicular pain/parasthesias per pt. Pt has not taken anything for pain.    Diet/Exercise: Pt states that she has been exercising for at least 30 mins daily in addition to longer drinking sodas and cutting out fried foods. Feels that she has more energy, feels that she is losing weight.     Review of Systems See HPI     Objective:   Physical Exam  Musculoskeletal:       Arms:  Gen: up in chair, NAD HEENT: NCAT,  CV: RRR, no murmurs auscultated PULM: CTAB, no wheezes, rales, rhoncii ABD: S/NT/+ bowel sounds  EXT: 2+ peripheral pulses MSK: + TTP in L thoracic spine on paraspinal muscles          Assessment & Plan:  1. Mood: Overall improved per pt. Will increase zoloft to 100mg  daily w/ planned follow up in 2 weeks.  2. Back Pain: mild paraspinal muscle strain. Back exercises discussed and handout given. Also told pt to use ibuprofen prn for pain.  3. Obesity: Overall weight trending down in setting of diet and exercise modification. Encouraged pt to continue progress. Noted 10lb weight loss since last visit. Will follow up in 2 weeks.

## 2010-06-16 NOTE — Patient Instructions (Signed)
You can use ibuprofen as needed for your back pain up to 800mg  three times a day I am also increasing your zoloft to 100mg  daily  Low Back Sprain with Rehab    A sprain is an injury in which a ligament is torn. The ligaments of the lower back are vulnerable to sprains. However, they are strong and require great force to be injured. These ligaments are important for stabilizing the spinal column. Sprains are classified into three categories. Grade 1 sprains cause pain, but the tendon is not lengthened. Grade 2 sprains include a lengthened ligament, due to the ligament being stretched or partially ruptured. With grade 2 sprains there is still function, although the function may be decreased. Grade 3 sprains involve a complete tear of the tendon or muscle, and function is usually impaired.   SYMPTOMS  Severe pain in the lower back.  Sometimes, a feeling of a "pop," "snap," or tear, at the time of injury.  Tenderness and sometimes swelling at the injury site.  Uncommonly, bruising (contusion) within 48 hours of injury.  Muscle spasms in the back.   CAUSES  Low back sprains occur when a force is placed on the ligaments that is greater than they can handle. Common causes of injury include:  Performing a stressful act while off-balance.  Repetitive stressful activities that involve movement of the lower back.  Direct hit (trauma) to the lower back.   RISK INCREASES WITH   Contact sports (football, wrestling).  Collisions (major skiing accidents).  Sports that require throwing or lifting (baseball, weightlifting).  Sports involving twisting of the spine (gymnastics, diving, tennis, golf).  Poor strength and flexibility.  Inadequate protection.  Previous back injury or surgery (especially fusion).   PREVENTIVE MEASURES   Wear properly fitted and padded protective equipment.  Warm up and stretch properly before activity.  Allow for adequate recovery between workouts.   Maintain physical fitness: l Strength, flexibility, and endurance. l Cardiovascular fitness.  Maintain a healthy body weight.   PROGNOSIS If treated properly, low back sprains usually heal with non-surgical treatment. The length of time for healing depends on the severity of the injury.    POSSIBLE COMPLICATIONS   Recurring symptoms, resulting in a chronic problem.  Chronic inflammation and pain in the low back.  Delayed healing or resolution of symptoms, especially if activity is resumed too soon.  Prolonged impairment.  Unstable or arthritic joints of the low back.   GENERAL TREATMENT CONSIDERATIONS  Treatment first involves the use of ice and medicine, to reduce pain and inflammation. The use of strengthening and stretching exercises may help reduce pain with activity. These exercises may be performed at home or with a therapist. Severe injuries may require referral to a therapist for further evaluation and treatment, such as ultrasound. Your caregiver may advise that you wear a back brace or corset, to help reduce pain and discomfort. Often, prolonged bed rest results in greater harm then benefit. Corticosteroid injections may be recommended. However, these should be reserved for the most serious cases. It is important to avoid using your back when lifting objects. At night, sleep on your back on a firm mattress, with a pillow placed under your knees. If non-surgical treatment is unsuccessful, surgery may be needed.    MEDICATION:   If pain medicine is needed, nonsteroidal anti-inflammatory medicines (aspirin and ibuprofen), or other minor pain relievers (acetaminophen), are often advised.   Do not take pain medicine for 7 days before surgery.   Prescription pain  relievers may be given, if your caregiver thinks they are needed. Use only as directed and only as much as you need.  Ointments applied to the skin may be helpful.  Corticosteroid injections may be given by your  caregiver. These injections should be reserved for the most serious cases, because they may only be given a certain number of times.   HEAT AND COLD:   Cold treatment (icing) should be applied for 10 to 15 minutes every 2 to 3 hours for inflammation and pain, and immediately after activity that aggravates your symptoms. Use ice packs or an ice massage.  Heat treatment may be used before performing stretching and strengthening activities prescribed by your caregiver, physical therapist, or athletic trainer. Use a heat pack or a warm water soak.     SEEK MEDICAL CARE IF:   Symptoms get worse or do not improve in 2 to 4 weeks, despite treatment.  You develop numbness or weakness in either leg.  You lose bowel or bladder function.  Any of the following occur after surgery: fever, increased pain, swelling, redness, drainage of fluids, or bleeding in the affected area.  New, unexplained symptoms develop. (Drugs used in treatment may produce side effects.)     EXERCISES   RANGE OF MOTION AND STRETCHING EXERCISES - Low Back Sprain Most people with lower back pain will find that their symptoms get worse with excessive bending forward (flexion) or arching at the lower back (extension). The exercises that will help resolve your symptoms will focus on the opposite motion.    Your physician, physical therapist or athletic trainer will help you determine which exercises will be most helpful to resolve your lower back pain. Do not complete any exercises without first consulting with your caregiver. Discontinue any exercises which make your symptoms worse, until you speak to your caregiver.    If you have pain, numbness or tingling which travels down into your buttocks, leg or foot, the goal of the therapy is for these symptoms to move closer to your back and eventually resolve. Sometimes, these leg symptoms will get better, but your lower back pain may worsen. This is often an indication of progress in  your rehabilitation. Be very alert to any changes in your symptoms and the activities in which you participated in the 24 hours prior to the change. Sharing this information with your caregiver will allow him or her to most efficiently treat your condition.   These exercises may help you when beginning to rehabilitate your injury. Your symptoms may resolve with or without further involvement from your physician, physical therapist or athletic trainer. While completing these exercises, remember:   Restoring tissue flexibility helps normal motion to return to the joints. This allows healthier, less painful movement and activity.  An effective stretch should be held for at least 30 seconds.  A stretch should never be painful. You should only feel a gentle lengthening or release in the stretched tissue.      FLEXION RANGE OF MOTION AND STRETCHING EXERCISES:  STRETCH - Flexion, Single Knee to Chest   Lie on a firm bed or floor with both legs extended in front of you.  Keeping one leg in contact with the floor, bring your opposite knee to your chest. Hold your leg in place by either grabbing behind your thigh or at your knee.  Pull until you feel a gentle stretch in your low back. Hold __________ seconds.  Slowly release your grasp and repeat the exercise with  the opposite side. Repeat __________ times. Complete this exercise __________ times per day.     STRETCH - Flexion, Double Knee to Chest   Lie on a firm bed or floor with both legs extended in front of you.  Keeping one leg in contact with the floor, bring your opposite knee to your chest.    Tense your stomach muscles to support your back and then lift your other knee to your chest. Hold your legs in place by either grabbing behind your thighs or at your knees.  Pull both knees toward your chest until you feel a gentle stretch in your low back. Hold __________ seconds.  Tense your stomach muscles and slowly return one leg at a  time to the floor. Repeat __________ times. Complete this exercise __________ times per day.     STRETCH - Low Trunk Rotation   Lie on a firm bed or floor. Keeping your legs in front of you, bend your knees so they are both pointed toward the ceiling and your feet are flat on the floor.  Extend your arms out to the side. This will stabilize your upper body by keeping your shoulders in contact with the floor.  Gently and slowly drop both knees together to one side until you feel a gentle stretch in your low back. Hold for __________ seconds.   Tense your stomach muscles to support your lower back as you bring your knees back to the starting position. Repeat the exercise to the other side. Repeat __________ times. Complete this exercise __________ times per day      EXTENSION RANGE OF MOTION AND FLEXIBILITY EXERCISES:  STRETCH - Extension, Prone on Elbows   Lie on your stomach on the floor, a bed will be too soft. Place your palms about shoulder width apart and at the height of your head.  Place your elbows under your shoulders. If this is too painful, stack pillows under your chest.  Allow your body to relax so that your hips drop lower and make contact more completely with the floor.  Hold this position for __________ seconds.  Slowly return to lying flat on the floor. Repeat __________ times. Complete this exercise __________ times per day.     RANGE OF MOTION - Extension, Prone Press Ups   Lie on your stomach on the floor, a bed will be too soft. Place your palms about shoulder width apart and at the height of your head.  Keeping your back as relaxed as possible, slowly straighten your elbows while keeping your hips on the floor. You may adjust the placement of your hands to maximize your comfort. As you gain motion, your hands will come more underneath your shoulders.  Hold this position __________ seconds.  Slowly return to lying flat on the floor. Repeat __________  times. Complete this exercise __________ times per day.     RANGE OF MOTION- Quadruped, Neutral Spine   Assume a hands and knees position on a firm surface. Keep your hands under your shoulders and your knees under your hips. You may place padding under your knees for comfort.    Drop your head and point your tailbone toward the ground below you. This will round out your lower back like an angry cat. Hold this position for __________ seconds.   Slowly lift your head and release your tail bone so that your back sags into a large arch, like an old horse.  Hold this position for __________ seconds.   Repeat this  until you feel limber in your low back.  Now, find your "sweet spot." This will be the most comfortable position somewhere between the two previous positions. This is your neutral spine. Once you have found this position, tense your stomach muscles to support your low back.  Hold this position for __________ seconds. Repeat __________ times. Complete this exercise __________ times per day.      STRENGTHENING EXERCISES - Low Back Sprain These exercises may help you when beginning to rehabilitate your injury. These exercises should be done near your "sweet spot." This is the neutral, low-back arch, somewhere between fully rounded and fully arched, that is your least painful position. When performed in this safe range of motion, these exercises can be used for people who have either a flexion or extension based injury. These exercises may resolve your symptoms with or without further involvement from your physician, physical therapist or athletic trainer. While completing these exercises, remember:   Muscles can gain both the endurance and the strength needed for everyday activities through controlled exercises.  Complete these exercises as instructed by your physician, physical therapist or athletic trainer. Increase the resistance and repetitions only as guided.  You may experience  muscle soreness or fatigue, but the pain or discomfort you are trying to eliminate should never worsen during these exercises. If this pain does worsen, stop and make certain you are following the directions exactly. If the pain is still present after adjustments, discontinue the exercise until you can discuss the trouble with your caregiver.     STRENGTHENING - Deep Abdominals, Pelvic Tilt   Lie on a firm bed or floor. Keeping your legs in front of you, bend your knees so they are both pointed toward the ceiling and your feet are flat on the floor.  Tense your lower abdominal muscles to press your low back into the floor.  This motion will rotate your pelvis so that your tail bone is scooping upwards rather than pointing at your feet or into the floor. With a gentle tension and even breathing, hold this position for __________ seconds. Repeat __________ times. Complete this exercise __________ times per day.      STRENGTHENING - Abdominals, Crunches   Lie on a firm bed or floor. Keeping your legs in front of you, bend your knees so they are both pointed toward the ceiling and your feet are flat on the floor. Cross your arms over your chest.    Slightly tip your chin down without bending your neck.  Tense your abdominals and slowly lift your trunk high enough to just clear your shoulder blades. Lifting higher can put excessive stress on the lower back and does not further strengthen your abdominal muscles.  Control your return to the starting position. Repeat __________ times. Complete this exercise __________ times per day.     STRENGTHENING - Quadruped, Opposite UE/LE Lift   Assume a hands and knees position on a firm surface. Keep your hands under your shoulders and your knees under your hips. You may place padding under your knees for comfort.    Find your neutral spine and gently tense your abdominal muscles so that you can maintain this position. Your shoulders and hips should form  a rectangle that is parallel with the floor and is not twisted.   Keeping your trunk steady, lift your right hand no higher than your shoulder and then your left leg no higher than your hip. Make sure you are not holding your breath. Hold  this position for __________ seconds.  Continuing to keep your abdominal muscles tense and your back steady, slowly return to your starting position. Repeat with the opposite arm and leg. Repeat __________ times. Complete this exercise __________ times per day.      STRENGTHENING - Abdominals and Quadriceps, Straight Leg Raise   Lie on a firm bed or floor with both legs extended in front of you.  Keeping one leg in contact with the floor, bend the other knee so that your foot can rest flat on the floor.  Find your neutral spine, and tense your abdominal muscles to maintain your spinal position throughout the exercise.  Slowly lift your straight leg off the floor about 6 inches for a count of 15, making sure to not hold your breath.  Still keeping your neutral spine, slowly lower your leg all the way to the floor.   Repeat this exercise with each leg __________ times. Complete this exercise __________ times per day.     POSTURE AND BODY MECHANICS CONSIDERATIONS - Low Back Sprain Keeping correct posture when sitting, standing or completing your activities will reduce the stress put on different body tissues, allowing injured tissues a chance to heal and limiting painful experiences. The following are general guidelines for improved posture. Your physician or physical therapist will provide you with any instructions specific to your needs. While reading these guidelines, remember:  The exercises prescribed by your provider will help you have the flexibility and strength to maintain correct postures.  The correct posture provides the best environment for your joints to work. All of your joints have less wear and tear when properly supported by a spine with  good posture. This means you will experience a healthier, less painful body.  Correct posture must be practiced with all of your activities, especially prolonged sitting and standing. Correct posture is as important when doing repetitive low-stress activities (typing) as it is when doing a single heavy-load activity (lifting).     RESTING POSITIONS Consider which positions are most painful for you when choosing a resting position. If you have pain with flexion-based activities (sitting, bending, stooping, squatting), choose a position that allows you to rest in a less flexed posture. You would want to avoid curling into a fetal position on your side. If your pain worsens with extension-based activities (prolonged standing, working overhead), avoid resting in an extended position such as sleeping on your stomach. Most people will find more comfort when they rest with their spine in a more neutral position, neither too rounded nor too arched. Lying on a non-sagging bed on your side with a pillow between your knees, or on your back with a pillow under your knees will often provide some relief.  Keep in mind, being in any one position for a prolonged period of time, no matter how correct your posture, can still lead to stiffness.    PROPER SITTING POSTURE In order to minimize stress and discomfort on your spine, you must sit with correct posture. Sitting with good posture should be effortless for a healthy body. Returning to good posture is a gradual process. Many people can work toward this most comfortably by using various supports until they have the flexibility and strength to maintain this posture on their own.   When sitting with proper posture, your ears will fall over your shoulders and your shoulders will fall over your hips. You should use the back of the chair to support your upper back. Your lower back will  be in a neutral position, just slightly arched. You may place a small pillow or folded  towel at the base of your lower back for  support.    When working at a desk, create an environment that supports good, upright posture. Without extra support, muscles tire, which leads to excessive strain on joints and other tissues. Keep these recommendations in mind:   CHAIR:    A chair should be able to slide under your desk when your back makes contact with the back of the chair. This allows you to work closely.  The chair's height should allow your eyes to be level with the upper part of your monitor and your hands to be slightly lower than your elbows.      BODY POSITION  Your feet should make contact with the floor. If this is not possible, use a foot rest.  Keep your ears over your shoulders. This will reduce stress on your neck and low back.     INCORRECT SITTING POSTURES  If you are feeling tired and unable to assume a healthy sitting posture, do not slouch or slump. This puts excessive strain on your back tissues, causing more damage and pain. Healthier options include:  Using more support, like a lumbar pillow.  Switching tasks to something that requires you to be upright or walking.  Talking a brief walk.  Lying down to rest in a neutral-spine position.      PROLONGED STANDING WHILE SLIGHTLY LEANING FORWARD  When completing a task that requires you to lean forward while standing in one place for a long time, place either foot up on a stationary 2-4 inch high object to help maintain the best posture. When both feet are on the ground, the lower back tends to lose its slight inward curve. If this curve flattens (or becomes too large), then the back and your other joints will experience too much stress, tire more quickly, and can cause pain.       CORRECT STANDING POSTURES Proper standing posture should be assumed with all daily activities, even if they only take a few moments, like when brushing your teeth. As in sitting, your ears should fall over your shoulders and  your shoulders should fall over your hips. You should keep a slight tension in your abdominal muscles to brace your spine. Your tailbone should point down to the ground, not behind your body, resulting in an over-extended swayback posture.      INCORRECT STANDING POSTURES  Common incorrect standing postures include a forward head, locked knees and/or an excessive swayback.     WALKING Walk with an upright posture. Your ears, shoulders and hips should all line-up.     PROLONGED ACTIVITY IN A FLEXED POSITION When completing a task that requires you to bend forward at your waist or lean over a low surface, try to find a way to stabilize 3 out of 4 of your limbs. You can place a hand or elbow on your thigh or rest a knee on the surface you are reaching across. This will provide you more stability, so that your muscles do not tire as quickly. By keeping your knees relaxed, or slightly bent, you will also reduce stress across your lower back.     CORRECT LIFTING TECHNIQUES DO :   Assume a wide stance. This will provide you more stability and the opportunity to get as close as possible to the object which you are lifting.  Tense your abdominals to brace your  spine. Bend at the knees and hips. Keeping your back locked in a neutral-spine position, lift using your leg muscles. Lift with your legs, keeping your back straight.  Test the weight of unknown objects before attempting to lift them.  Try to keep your elbows locked down at your sides in order get the best strength from your shoulders when carrying an object.  Always ask for help when lifting heavy or awkward objects.    INCORRECT LIFTING TECHNIQUES DO NOT:   Lock your knees when lifting, even if it is a small object.  Bend and twist. Pivot at your feet or move your feet when needing to change directions.  Assume that you can safely pick up even a paperclip without proper posture.   Document Released: 03/12/2005  Document  Re-Released: 01/07/2009 [facility] Patient Information 2011 [place], [place].

## 2010-07-03 ENCOUNTER — Ambulatory Visit: Payer: Commercial Managed Care - PPO | Admitting: Family Medicine

## 2010-07-13 ENCOUNTER — Ambulatory Visit (INDEPENDENT_AMBULATORY_CARE_PROVIDER_SITE_OTHER): Payer: Commercial Managed Care - PPO | Admitting: Family Medicine

## 2010-07-13 ENCOUNTER — Encounter: Payer: Self-pay | Admitting: Family Medicine

## 2010-07-13 VITALS — BP 129/85 | HR 83 | Temp 97.8°F | Ht 66.0 in | Wt 210.0 lb

## 2010-07-13 DIAGNOSIS — F321 Major depressive disorder, single episode, moderate: Secondary | ICD-10-CM

## 2010-07-13 DIAGNOSIS — F411 Generalized anxiety disorder: Secondary | ICD-10-CM

## 2010-07-13 DIAGNOSIS — F419 Anxiety disorder, unspecified: Secondary | ICD-10-CM

## 2010-07-13 MED ORDER — PROPRANOLOL HCL 10 MG PO TABS: 10.0000 mg | ORAL_TABLET | Freq: Two times a day (BID) | ORAL | Status: DC

## 2010-07-13 NOTE — Patient Instructions (Signed)
I am starting you on propranolol for your anxiety Take as prescribed Call me on Monday to let me know how its doing for your anxiety Otherwise call with any questions, Doree Albee MD

## 2010-07-13 NOTE — Assessment & Plan Note (Signed)
Overall sxs consistent with performance anxiety. Will start pt on short course of propranolol. Will give pt trial of medication to use over the weekend. If unsuccessful, may consider titration of zoloft/wellbutrin to max dosing. Though, I think propranolol should be effective.

## 2010-07-13 NOTE — Progress Notes (Signed)
  Subjective:    Patient ID: Hailey Hopkins, female    DOB: 1976-01-23, 35 y.o.   MRN: [id number]  HPI Pt here for follow up on mood. Mood overall stable per pt. NO HI/SI. Has been having some significant hand shaking and nervousness in setting of upcoming finals. Pt currently full time student at [employer]. Performing well per pt. No racing thoughts. Has been able to process and manage tasks well this semester with zoloft and wellbutrin per pt.  Review of Systems See HPI    Objective:   Physical Exam Gen- Up in chair, NAD CV- RRR PULM- CTAB NEURO-+ non-intention tremor in hands bilaterally     Assessment & Plan:  Mood- Overall sxs consistent with performance anxiety. Will start pt on short course of propranolol. Will give pt trial of medication to use over the weekend. If unsuccessful, may consider titration of zoloft/wellbutrin to max dosing. Though, I think propranolol should be effective.

## 2010-08-11 NOTE — Discharge Summary (Signed)
NAME:  [name], [name]                      ACCOUNT NO.:  [id number]   MEDICAL RECORD NO.:  [id number]                   PATIENT TYPE:  INP   LOCATION:  9105                                 FACILITY:  [facility]   PHYSICIAN:  Phil D. Okey Dupre, M.D.                  DATE OF BIRTH:  29-May-1975   DATE OF ADMISSION:  03/18/2003  DATE OF DISCHARGE:  03/22/2003                                 DISCHARGE SUMMARY   PRIMARY MEDICAL PHYSICIAN:  Rodolph Bong, M.D., family practice residency  program.   DISCHARGE DIAGNOSES:  1. Postoperative day number three status post low transverse cesarean     section (LTCS) for failure to progress.  2. Chorioamnionitis.   DISCHARGE MEDICATIONS:  1. Motrin 600 mg p.o. q.6h. for pain.  2. Percocet 5/325 one to two tablets p.o. q.6h. as needed for severe pain.  3. Prenatal vitamins one tablet each day while breast feeding.   BRIEF ADMISSION HISTORY:  Ms. Hailey Hopkins is a very pleasant female who  presented at 68 1/7 weeks in active labor.  She was admitted for further  medical management.   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  During her expected management, she developed a fever and some decreased  variability on her fetal heart tone tracing.  Ampicillin and gentamycin were  instituted for the risk of chorioamnionitis.  [name] continued to have some  decreased variability but the overall tracing was reassuring.  She  progressed to full dilatation but, after pushing for greater than three  hours without significant change, it was determined that a low transverse  cesarean section would be her best option.  Subsequently, she underwent this  on March 18, 2003.  There were no complications.  She gave birth to a  viable female infant without any complications.  She finished her course of  antibiotics postoperatively and did well for the remainder of three days  while in the hospital.  She was discharged home on March 22, 2003,  for  return for staple removal in the MAU on March 24, 2003.  She was to  follow up in the [facility] in six weeks.   DIET:  The patient was advised to continue her diabetic diet at the present  time.   ACTIVITY:  She was advised no sexual activity for six weeks.   FOLLOWUP:  [facility] in six weeks.     Rodolph Bong, M.D.                        Phil D. Okey Dupre, M.D.    AK/MEDQ  D:  05/23/2003  T:  05/23/2003  Job:  [id number]

## 2010-08-11 NOTE — Op Note (Signed)
Hailey Hopkins, Hailey Hopkins                      ACCOUNT NO.:  [id number]   MEDICAL RECORD NO.:  [id number]                   PATIENT TYPE:  INP   LOCATION:  9105                                 FACILITY:  [facility]   PHYSICIAN:  Tanya S. Shawnie Pons, M.D.                DATE OF BIRTH:  [birth year]   DATE OF PROCEDURE:  03/19/2003  DATE OF DISCHARGE:                                 OPERATIVE REPORT   PREOPERATIVE DIAGNOSES:  1. Intrauterine pregnancy at 40+ weeks.  2. Gestational diabetes, A1.  3. Arrest of descent.   POSTOPERATIVE DIAGNOSES:  1. Intrauterine pregnancy at 40+ weeks.  2. Gestational diabetes, A1.  3. Arrest of descent.   PROCEDURE:  Primary low transverse cesarean section.   SURGEON:  Shelbie Proctor. Shawnie Pons, M.D.   ASSISTANT:  ____________, P.A. student.   ANESTHESIA:  Epidural, Burnett Corrente, M.D.   FINDINGS:  Delivery of a viable infant, Apgars 8 and 9.   ESTIMATED BLOOD LOSS:  1000 mL.  Her actual blood loss was probably in the  neighborhood of 1500 mL.   COMPLICATIONS:  Extension of the uterine incision down to the cervix.   REASON FOR PROCEDURE:  In brief, the patient is a 35 year old gravida 1,  para 0-0-0-0, with SROM.  She at that time was 2, 60, vertex, and -3.  She  was admitted for labor augmentation and loaded with Pitocin.  She is also a  gestational diabetic.  She had an estimated fetal weight of 3459 g  approximately two weeks prior to admission.  She was admitted to labor and  delivery and underwent augmentation with Pitocin.  She had a somewhat  delayed labor with poor progression as well as some decelerations.  At one  point her Pitocin had to be turned off and then resumed, and she had a  dysfunctional contraction pattern; however, after being at 7 for several  hours she finally progressed to 9 and then to complete.  At complete she  pushed for two hours with no significant change in the BPD.  She was  persistent occiput posterior and though caput was  moving to a +4 station,  the head itself was still at a +2.  Given her gestational diabetes, it was  felt operative vaginal delivery was not an option.  She also had  chorioamnionitis, at the time of the procedure had gotten one dose of  antibiotics.   DESCRIPTION OF PROCEDURE:  The patient was taken to the OR, where she was  placed in supine position with a left lateral tilt.  She was then prepped  and draped in the usual sterile fashion.  Anesthesia was tested.  Then a  Pfannenstiel incision was made with a knife through the skin to the  underlying fascia.  This was divided sharply in the midline and then the  incision extended with the Mayo scissors bilaterally.  The superior and  inferior edges of  the fascia were then grasped with Kocher clamps and  dissected off the underlying rectus bluntly laterally and sharply in the  midline.  The rectus muscle was then divided bluntly and the peritoneal  cavity entered bluntly.  This incision was extended by the surgeons pulling  on either side of the incision.  The bladder blade was then placed inside  the peritoneum and the wrench was used for retraction.  A knife was then  used to make a low transverse incision on the uterus.  The lower uterine  segment was exceptionally thin and there was bloody fluid noted in the  uterine cavity.  Again the infant was noted to be at a +2 to +3 station and  in the process of trying to get the head up and delivered, an extension of  the uterine incision cervically was noted on the patient's left side.  Attempt to deliver was made two or three times.  Given the patient's  persistent OP, the fact the hand was trying to deliver with the body as well  as delivery of the umbilical cord, a vacuum was used to deliver the baby  easily.  Once the child was out it was bulb-suctioned, the cord was clamped  x2, cut, the cord blood obtained, the placenta delivered easily, the uterus  cleaned with a dry lap pad.  Ring  forceps were then used to grasp either  side of the uterine incision and to try to get the apex.  There appeared to  be two apices on the left side and initially a 0 Vicryl suture was used to  close the upper apex and then continued throughout the uterine incision in a  locked running fashion.  This suture was used by three-quarters of the way  through, and so the right apex of the incision was seen and closed with a 0  Vicryl suture in a locked running fashion.  A second imbricating layer was  then placed over this and bleeding was noted in the left lower edge of the  incision and the incision that could be seen extending into the cervix.  Several figures-of-eight as well as partial O'Leary suture was used for  hemostasis.  Pressure was applied for several minutes until good hemostasis  was noted.  There was a minimal amount of bleeding along the peritoneal  edges of the uterine incision, which were cauterized with the  electrocautery.  The gutters were then copiously irrigated and the incision  was again inspected and found to be hemostatic.  All instruments were  removed from the peritoneal cavity as well as lap pads, and the fascia was  closed with a 0 Vicryl suture in a running fashion.  Any bleeders on the  rectus fascia were cauterized with the electrocautery prior to this.  Subcutaneous tissue was then copiously irrigated and any bleeders cauterized  with the electrocautery and the skin closed using clips.  Post procedure 10  mL of 0.25% Marcaine were injected in the incision.  The patient tolerated  the procedure well.  All instrument, needle, and lap counts were correct x2.  The patient was awakened and taken to the recovery room in stable condition.                                               Shelbie Proctor. Shawnie Pons, M.D.    TSP/MEDQ  D:  03/19/2003  T:  03/19/2003  Job:  [id number]

## 2010-09-20 ENCOUNTER — Other Ambulatory Visit: Payer: Self-pay | Admitting: Family Medicine

## 2010-09-20 NOTE — Telephone Encounter (Signed)
Refill request

## 2010-10-28 ENCOUNTER — Other Ambulatory Visit: Payer: Self-pay | Admitting: Family Medicine

## 2010-10-30 NOTE — Telephone Encounter (Signed)
Refill request

## 2010-11-23 ENCOUNTER — Ambulatory Visit: Payer: Commercial Managed Care - PPO | Admitting: Family Medicine

## 2010-11-28 ENCOUNTER — Other Ambulatory Visit: Payer: Self-pay | Admitting: Family Medicine

## 2010-11-29 NOTE — Telephone Encounter (Signed)
Refill request

## 2010-12-14 ENCOUNTER — Ambulatory Visit: Payer: Commercial Managed Care - PPO | Admitting: Family Medicine

## 2010-12-21 ENCOUNTER — Ambulatory Visit (INDEPENDENT_AMBULATORY_CARE_PROVIDER_SITE_OTHER): Payer: 59 | Admitting: Family Medicine

## 2010-12-21 ENCOUNTER — Encounter: Payer: Self-pay | Admitting: Family Medicine

## 2010-12-21 VITALS — BP 130/80 | HR 86 | Wt 226.3 lb

## 2010-12-21 DIAGNOSIS — R5383 Other fatigue: Secondary | ICD-10-CM

## 2010-12-21 DIAGNOSIS — F321 Major depressive disorder, single episode, moderate: Secondary | ICD-10-CM

## 2010-12-21 DIAGNOSIS — R5381 Other malaise: Secondary | ICD-10-CM

## 2010-12-21 MED ORDER — SERTRALINE HCL 50 MG PO TABS: 150.0000 mg | ORAL_TABLET | Freq: Every day | ORAL | Status: DC

## 2010-12-21 NOTE — Patient Instructions (Addendum)
It was good to see you today I am increasing your zoloft for your mood. I will check some blood work for your fatigue I would also like to schedule you for a sleep study to make sure you not have sleep apnea Come back to see me in 2-3 weeks Call with any questions God Bless,  Doree Albee MD

## 2010-12-21 NOTE — Progress Notes (Signed)
  Subjective:    Patient ID: Hailey Hopkins, female    DOB: 1975-12-10, 35 y.o.   MRN: [id number]  HPI Pt here for mood follow up.  Pt states that mood has subjectively worsened since last clinical visit. Pt denies any depressed mood, HI or SI. Pt does however report persistent fatigue since last clinical visit. Patient states that she has noted overall decreased energy, with generalized feeling of fatigue and decreased concentration. Patient is noted to be currently working full-time as well as taking on approximately 16 credit hours in school currently(pt is Consulting civil engineer at [employer]). Patient states that performance in school is relatively unchanged. She's been maintaining good grades. Work performance is also relatively unchanged. Patient states that she is sleeping approximately 5-7 hours every night. Patient's been compliant with Wellbutrin XL  300 as well as Zoloft 100. Medications seem to hit a plateau affect approximately 2-3 months ago. Of note patient was also recently  started on propanolol 6 months ago for anxiety related tremors. Patient states that propanolol as has helped with this. Patient also states her fatigue was present prior to starting of propanolol and that fatigue has not worsened since this medication was started. Patient also reports 10-15 pound weight gain since last clinical visit as well as decreased physical activity. Patient is also post hysterectomy from last year. Ovaries were left in. Positive daytime somnolence. Patient is unsure if she snores.  Review of Systems See HPI     Objective:   Physical Exam Gen: up in chair, NAD, mildly obese HEENT: NCAT, EOMI, TMs clear bilaterally, increased neck girth.  CV: RRR, no murmurs auscultated PULM: CTAB, no wheezes, rales, rhoncii ABD: S/NT/+ bowel sounds  EXT: 2+ peripheral pulses    Assessment & Plan:

## 2010-12-22 LAB — CBC
Hemoglobin: 14.2 g/dL (ref 12.0–15.0)
Platelets: 255 10*3/uL (ref 150–400)
RBC: 4.63 MIL/uL (ref 3.87–5.11)
WBC: 4.7 10*3/uL (ref 4.0–10.5)

## 2010-12-22 LAB — COMPREHENSIVE METABOLIC PANEL
ALT: 19 U/L (ref 0–35)
Albumin: 4.4 g/dL (ref 3.5–5.2)
CO2: 22 mEq/L (ref 19–32)
Calcium: 9.3 mg/dL (ref 8.4–10.5)
Chloride: 102 mEq/L (ref 96–112)
Glucose, Bld: 86 mg/dL (ref 70–99)
Potassium: 3.8 mEq/L (ref 3.5–5.3)
Sodium: 138 mEq/L (ref 135–145)
Total Protein: 7.5 g/dL (ref 6.0–8.3)

## 2010-12-22 NOTE — Assessment & Plan Note (Signed)
Likely multifactorial with contribution of workload as well as overall mood. We'll check a TSH as well as a CBC. Given neck girth, will set up patient for sleep study. Discussed with patient importance of exercise. Also broach issue patient possibly decreasing overall workload to improve rest. Will followup in 2-3 weeks.

## 2010-12-22 NOTE — Assessment & Plan Note (Signed)
Will increase Zoloft today. PHQ-9 score 4. Will reassess in 2-3 weeks. Both Wellbutrin and Zoloft region upper ranges of normal dosing. Serotonin syndrome red flags discussed. We'll also re-approach issue of psychology or psychiatry referral at next visit. Patient agreeable plan.

## 2011-01-02 ENCOUNTER — Encounter: Payer: Self-pay | Admitting: Family Medicine

## 2011-01-11 ENCOUNTER — Ambulatory Visit: Payer: 59 | Admitting: Family Medicine

## 2011-01-16 ENCOUNTER — Other Ambulatory Visit: Payer: Self-pay | Admitting: Family Medicine

## 2011-01-17 NOTE — Telephone Encounter (Signed)
Refill request

## 2011-01-26 ENCOUNTER — Ambulatory Visit (HOSPITAL_BASED_OUTPATIENT_CLINIC_OR_DEPARTMENT_OTHER): Payer: 59 | Attending: Family Medicine

## 2011-01-26 ENCOUNTER — Ambulatory Visit: Payer: 59 | Admitting: Family Medicine

## 2011-01-26 DIAGNOSIS — R0989 Other specified symptoms and signs involving the circulatory and respiratory systems: Secondary | ICD-10-CM | POA: Insufficient documentation

## 2011-01-26 DIAGNOSIS — R0609 Other forms of dyspnea: Secondary | ICD-10-CM | POA: Insufficient documentation

## 2011-01-26 DIAGNOSIS — R5383 Other fatigue: Secondary | ICD-10-CM

## 2011-01-26 DIAGNOSIS — G4710 Hypersomnia, unspecified: Secondary | ICD-10-CM | POA: Insufficient documentation

## 2011-01-26 DIAGNOSIS — G4730 Sleep apnea, unspecified: Secondary | ICD-10-CM | POA: Insufficient documentation

## 2011-01-27 DIAGNOSIS — R0609 Other forms of dyspnea: Secondary | ICD-10-CM

## 2011-01-27 DIAGNOSIS — R0989 Other specified symptoms and signs involving the circulatory and respiratory systems: Secondary | ICD-10-CM

## 2011-01-27 DIAGNOSIS — G4710 Hypersomnia, unspecified: Secondary | ICD-10-CM

## 2011-01-27 DIAGNOSIS — G4730 Sleep apnea, unspecified: Secondary | ICD-10-CM

## 2011-01-29 NOTE — Procedures (Signed)
Hailey Hopkins, Hailey Hopkins            ACCOUNT NO.:  [id number]  MEDICAL RECORD NO.:  [id number]          PATIENT TYPE:  OUT  LOCATION:  SLEEP CENTER                 [facility]ACILITY[facility]  [facility]  PHYSICIAN:  Keno Caraway D. Maple Hudson, MD, FCCP, FACPDATE OF BIRTH:  DATE OF STUDY:  01/26/2011                           NOCTURNAL POLYSOMNOGRAM  REFERRING PHYSICIAN:  Wayne A. Sheffield Slider, M.D.  REFERRING PHYSICIAN:  Wayne A. Sheffield Slider, MD  INDICATION FOR STUDY:  Hypersomnia with sleep apnea.  EPWORTH SLEEPINESS SCORE:  10/24.  BMI 35, weight 217 pounds, height 66 inches, neck 15 inches.  MEDICATIONS:  Home medications are charted and reviewed.  SLEEP ARCHITECTURE:  Total sleep time 326.5 minutes with sleep efficiency 91.3%.  Stage I was 2.6%, stage II 79.2%, stage III 1.1%, REM 17.2% of total sleep time.  Sleep latency 24 minutes, REM latency 154 minutes, awake after sleep onset 5 minutes, arousal index 16.9.  BEDTIME MEDICATION:  None.  RESPIRATORY DATA:  Apnea-hypopnea index (AHI) 0.4 per hour.  A total of 2 events was scored, both as hypopneas in supine sleep position.  She did not qualify for CPAP split protocol titration.  OXYGEN DATA:  Mild snoring with oxygen desaturation to a nadir of 92% and the mean oxygen saturation through the study of 95.9% on room air.  CARDIAC DATA:  Normal sinus rhythm.  MOVEMENT-PARASOMNIA:  Insignificant body movement disturbance.  No bathroom trips.  IMPRESSIONS-RECOMMENDATIONS: 1. Unremarkable study for sleep center environment with normal sleep     architecture.  No medication at bedtime. 2. Rare respiratory events with sleep disturbance, within normal     limits.  AHI 0.4 per hour (the normal range for     adults is from 0-5 per hour).  Mild snoring with oxygen     desaturation to a nadir of 92% and the mean oxygen saturation     through the study of 95.9% on room air.     Akirra Lacerda D. Maple Hudson, MD, [facility], FACP Diplomate, Biomedical engineer of Sleep Medicine Electronically  Signed    CDY/MEDQ  D:  01/27/2011 10:39:05  T:  01/27/2011 23:44:55  Job:  [id number]

## 2011-02-13 ENCOUNTER — Encounter: Payer: Self-pay | Admitting: Family Medicine

## 2011-02-13 ENCOUNTER — Ambulatory Visit (INDEPENDENT_AMBULATORY_CARE_PROVIDER_SITE_OTHER): Payer: 59 | Admitting: Family Medicine

## 2011-02-13 VITALS — BP 128/80 | HR 84 | Temp 97.7°F | Ht 66.0 in | Wt 225.3 lb

## 2011-02-13 DIAGNOSIS — R300 Dysuria: Secondary | ICD-10-CM

## 2011-02-13 DIAGNOSIS — N390 Urinary tract infection, site not specified: Secondary | ICD-10-CM

## 2011-02-13 DIAGNOSIS — J069 Acute upper respiratory infection, unspecified: Secondary | ICD-10-CM

## 2011-02-13 LAB — POCT URINALYSIS DIPSTICK
Ketones, UA: NEGATIVE
Nitrite, UA: NEGATIVE
Protein, UA: NEGATIVE
Urobilinogen, UA: 1
pH, UA: 7

## 2011-02-13 MED ORDER — NITROFURANTOIN MONOHYD MACRO 100 MG PO CAPS: 100.0000 mg | ORAL_CAPSULE | Freq: Two times a day (BID) | ORAL | Status: AC

## 2011-02-13 NOTE — Progress Notes (Signed)
  Subjective:    Patient ID: Hailey Hopkins, female    DOB: [birth year], 35 y.o.   MRN: [id number]  HPI Pt with viral URI and UI sxs x 1 week. Pt states that she noticed worsening nasal congestion, rhinorrhea since earlier last week in addition to mild dysuria as well as change in urine color and odor. Pt states that she also spiked at temp of up to 102 over the weekend x 2 days that improved with tylenol/NSAIDs. Unsure of sick contacts. 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 + nausea, decreased solid food intake. No rash, no abd pain. No dysruria. Pt is a non smoker.     Review of Systems See HPI     Objective:   Physical Exam Gen: up in chair, NAD HEENT: NCAT, EOMI, TMs clear bilaterally, + nasal erythema bilaterally, + post oropharyngeal erythema,  CV: RRR, no murmurs auscultated PULM: CTAB, no wheezes, rales, rhoncii ABD: S/NT/+ bowel sounds  EXT: 2+ peripheral pulses   Assessment & Plan:

## 2011-02-13 NOTE — Assessment & Plan Note (Signed)
Will treat with macrobid in setting of PCN allergy(true anaphylactic reaction). Will send urine for cx. Discussed infectious red flags.

## 2011-02-13 NOTE — Assessment & Plan Note (Signed)
Likely viral in etiology in setting of concominant UTI. Discussed infectious red flags. Will follow up as needed.

## 2011-02-13 NOTE — Patient Instructions (Signed)

## 2011-02-15 LAB — URINE CULTURE: Colony Count: 60000

## 2011-02-19 ENCOUNTER — Telehealth: Payer: Self-pay | Admitting: Family Medicine

## 2011-02-19 NOTE — Telephone Encounter (Signed)
Fwd. To Dr.Newton for review .Hailey Hopkins  

## 2011-02-19 NOTE — Telephone Encounter (Signed)
Pt seen last week, was told to call back if not better, pt still having same issues and needs something called in, pt goes to [facility]/[address]

## 2011-02-21 NOTE — Telephone Encounter (Signed)
Attempted to call pt back both home and work numbers. Pt was unavailable and could not leave message.

## 2011-03-11 ENCOUNTER — Other Ambulatory Visit: Payer: Self-pay | Admitting: Family Medicine

## 2011-03-12 NOTE — Telephone Encounter (Signed)
Refill request

## 2011-04-06 ENCOUNTER — Ambulatory Visit (INDEPENDENT_AMBULATORY_CARE_PROVIDER_SITE_OTHER): Payer: 59 | Admitting: Family Medicine

## 2011-04-06 ENCOUNTER — Encounter: Payer: Self-pay | Admitting: Family Medicine

## 2011-04-06 VITALS — BP 131/84 | HR 76 | Temp 97.8°F | Ht 66.0 in | Wt 234.0 lb

## 2011-04-06 DIAGNOSIS — R1011 Right upper quadrant pain: Secondary | ICD-10-CM

## 2011-04-06 LAB — CBC WITH DIFFERENTIAL/PLATELET
Eosinophils Absolute: 0.1 10*3/uL (ref 0.0–0.7)
Hemoglobin: 14.3 g/dL (ref 12.0–15.0)
Lymphocytes Relative: 45 % (ref 12–46)
Lymphs Abs: 2.4 10*3/uL (ref 0.7–4.0)
MCH: 30.5 pg (ref 26.0–34.0)
Monocytes Relative: 8 % (ref 3–12)
Neutrophils Relative %: 45 % (ref 43–77)
RBC: 4.69 MIL/uL (ref 3.87–5.11)

## 2011-04-06 LAB — LIPASE: Lipase: 20 U/L (ref 0–75)

## 2011-04-06 LAB — COMPREHENSIVE METABOLIC PANEL
Albumin: 4.6 g/dL (ref 3.5–5.2)
CO2: 24 mEq/L (ref 19–32)
Calcium: 9.1 mg/dL (ref 8.4–10.5)
Chloride: 106 mEq/L (ref 96–112)
Glucose, Bld: 103 mg/dL — ABNORMAL HIGH (ref 70–99)
Potassium: 3.8 mEq/L (ref 3.5–5.3)
Sodium: 138 mEq/L (ref 135–145)
Total Bilirubin: 0.6 mg/dL (ref 0.3–1.2)
Total Protein: 7.4 g/dL (ref 6.0–8.3)

## 2011-04-06 LAB — LIPID PANEL: Cholesterol: 197 mg/dL (ref 0–200)

## 2011-04-06 MED ORDER — OMEPRAZOLE 20 MG PO CPDR: 20.0000 mg | DELAYED_RELEASE_CAPSULE | Freq: Every day | ORAL | Status: DC

## 2011-04-06 NOTE — Assessment & Plan Note (Addendum)
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 Will obtain RUQ ultrasound as well as baseline labs including CBC, CMET, lipase, and lipid profile. Will start pt on protonix as pt does have some mild epigastric tenderness. Discussed low fat diet as well as general red flags for reevaluation. Will follow up in 1 week.

## 2011-04-06 NOTE — Progress Notes (Signed)
  Subjective:    Patient ID: Hailey Hopkins, female    DOB: [birth year], 36 y.o.   MRN: [id number]  HPI Pt presents today for evaluation of RUQ pain. Pt states that pain has been present for the last 5-6 weeks. Pt states that she was evaluated for this pain at a local UC with a working dx of possible GB disease. Pt states that imaging was not done at the time. Pt describes pain as being in RUQ, colcky in nature. Pain seems to be worsened with eating. Pt denies eating a high fat diet. Pt states that she is unsure of any foods that make pain feel better or worse. Pt states that she has been able to tolerate water and crackers and some soups. Pt also reports one episode of emesis on 1/8. Emesis was yellowish-greensh in color. Pt denies any reflux sxs including hearthburn, belching, morning hoarseness, or chronic sore throat. Pt states that she has a significant family history of GB disease with both her older brother and father having cholecystectomies in the past. No fevers, diarrhea. Pt is s/p hysterectomy.    Review of Systems See HPI, otherwise ROS negative.     Objective:   Physical Exam Gen: up in chair, NAD HEENT: NCAT, EOMI, TMs clear bilaterally CV: RRR, no murmurs auscultated PULM: CTAB, no wheezes, rales, rhoncii ABD: obese, + mid epigastric and RUQ tenderness, + bowel sounds,  EXT: 2+ peripheral pulses   Assessment & Plan:

## 2011-04-06 NOTE — Patient Instructions (Signed)
Gallbladder Disease Gallbladder disease (cholecystitis) is an inflammation of your gallbladder. It is usually caused by a build-up of stones (gallstones) or sludge (cholelithiasis) in your gallbladder. The gallbladder is not an essential organ. It is located slightly to the right of center in the belly (abdomen), behind the liver. It stores bile made in the liver. Bile aids in digestion of fats. Gallbladder disease may result in nausea (feeling sick to your stomach), abdominal pain, and jaundice. In severe cases, emergency surgery may be required. The most common type of gallbladder disease is gallstones. They begin as small crystals and slowly grow into stones. Gallstone pain occurs when the bile duct has spasms. The spasms are caused by the stone passing out of the duct. The stone is trying to pass at the same time bile is passing into the small bowel for digestion. The pain usually begins suddenly. It may persist from several minutes to several hours. Infection can occur. Infection can add to discomfort and severity of an acute attack. The pain may be made worse by breathing deeply or by being jarred. There may be fever and tenderness to the touch. In some cases, when gallstones do not move into the bile duct, people have no pain or symptoms. These are called "silent" gallstones. Women are three times more likely to develop gallstones than men. Women who have had several pregnancies are more likely to have gallbladder disease. Physicians sometimes advise removing diseased gallbladders before future pregnancies. Other factors that increase the risk of gallbladder disease are obesity, diets heavy in fried foods and dairy products, increasing age, prolonged use of medications containing female hormones, and heredity. HOME CARE INSTRUCTIONS   If your physician prescribed an antibiotic, take as directed.   Only take over-the-counter or prescription medicines for pain, discomfort, or fever as directed by your  caregiver.   Follow a low fat diet until seen again. (Fat causes the gallbladder to contract.)   Follow-up as instructed. Attacks are almost always recurrent and surgery is usually required for permanent treatment.  SEEK IMMEDIATE MEDICAL CARE IF:   Pain is increasing and not controlled by medications.   The pain moves to another part of your abdomen or to your back. (Right sided pain can be appendicitis and left sided pain in adults can be diverticulitis).   You have a fever.   You develop nausea and vomiting.  Document Released: 03/12/2005 Document Revised: 11/22/2010 Document Reviewed: 10/30/2007 Exit[employer] Patient Information 2012 [employer]. 

## 2011-04-08 ENCOUNTER — Encounter: Payer: Self-pay | Admitting: Family Medicine

## 2011-04-10 ENCOUNTER — Ambulatory Visit
Admission: RE | Admit: 2011-04-10 | Discharge: 2011-04-10 | Disposition: A | Discharge status: Home / Self Care | Payer: 59 | Source: Ambulatory Visit | Attending: Family Medicine | Admitting: Family Medicine

## 2011-04-10 DIAGNOSIS — R1011 Right upper quadrant pain: Secondary | ICD-10-CM

## 2011-04-11 ENCOUNTER — Telehealth: Payer: Self-pay | Admitting: Family Medicine

## 2011-04-11 NOTE — Telephone Encounter (Signed)
Will forward to Dr Newton. 

## 2011-04-11 NOTE — Telephone Encounter (Signed)
Returned call to patient.  Wants results of abd. U/S done on 04/10/2011.  States her pain is the same from last week.  Patient to follow-up here in 1 week per last office note.  She does not want to come back in.  Wants to talk with Dr. Alvester Morin about results first.   Will route note to Dr. Alvester Morin and call patient back at [phone].  Gaylene Brooks, RN

## 2011-04-11 NOTE — Telephone Encounter (Signed)
Wants to know results of her [place]  - doesn't want to have to come back in if she doesn't have to.

## 2011-04-12 ENCOUNTER — Encounter: Payer: Self-pay | Admitting: Family Medicine

## 2011-04-12 ENCOUNTER — Other Ambulatory Visit: Payer: Self-pay | Admitting: Family Medicine

## 2011-04-12 DIAGNOSIS — K760 Fatty (change of) liver, not elsewhere classified: Secondary | ICD-10-CM | POA: Insufficient documentation

## 2011-04-12 NOTE — Telephone Encounter (Signed)
Called to follow up with pt about lab results. Discussed with pt result of pt having fatty liver on abdominal ultrasound. Discussed with pt continuing low fat diet as well as need to further lab testing. Pt states that she will be able to come in next week for this.

## 2011-04-12 NOTE — Telephone Encounter (Signed)
Refill request

## 2011-04-20 ENCOUNTER — Ambulatory Visit: Payer: 59 | Admitting: Family Medicine

## 2011-04-30 ENCOUNTER — Telehealth: Payer: Self-pay | Admitting: *Deleted

## 2011-04-30 ENCOUNTER — Encounter: Payer: Self-pay | Admitting: Family Medicine

## 2011-04-30 ENCOUNTER — Ambulatory Visit (INDEPENDENT_AMBULATORY_CARE_PROVIDER_SITE_OTHER): Payer: 59 | Admitting: Family Medicine

## 2011-04-30 DIAGNOSIS — K760 Fatty (change of) liver, not elsewhere classified: Secondary | ICD-10-CM

## 2011-04-30 DIAGNOSIS — K7581 Nonalcoholic steatohepatitis (NASH): Secondary | ICD-10-CM

## 2011-04-30 DIAGNOSIS — K7689 Other specified diseases of liver: Secondary | ICD-10-CM

## 2011-04-30 DIAGNOSIS — E669 Obesity, unspecified: Secondary | ICD-10-CM

## 2011-04-30 LAB — POCT GLYCOSYLATED HEMOGLOBIN (HGB A1C): Hemoglobin A1C: 5.1

## 2011-04-30 NOTE — Assessment & Plan Note (Signed)
Weigth trending down. 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

## 2011-04-30 NOTE — Telephone Encounter (Signed)
lvm informing pt of appt with Dr. Elnoria Howard on 02.11.2013 @ 145 pm. Their office is located in the [facility] center [address]. Phone: [phone]. Pt advised to bring Insurance card and co-pay. Also told that if she cannot keep this appt she will need to call their office 24 hours in advance to cancel/reschedule appt or she may be charged a cancellation fee.  Pt's notes faxed to [phone].Laureen Ochs, Viann Shove

## 2011-04-30 NOTE — Patient Instructions (Signed)
Fatty Liver Fatty liver is the accumulation of fat in liver cells. It is also called hepatosteatosis or steatohepatitis. It is normal for your liver to contain some fat. If fat is more than 5 to 10% of your liver's weight, you have fatty liver.  There are often no symptoms (problems) for years while damage is still occurring. People often learn about their fatty liver when they have medical tests for other reasons. Fat can damage your liver for years or even decades without causing problems. When it becomes severe, it can cause fatigue, weight loss, weakness, and confusion. This makes you more likely to develop more serious liver problems. The liver is the largest organ in the body. It does a lot of work and often gives no warning signs when it is sick until late in a disease. The liver has many important jobs including:  Breaking down foods.   Storing vitamins, iron, and other minerals.   Making proteins.   Making bile for food digestion.   Breaking down many products including medications, alcohol and some poisons.  CAUSES  There are a number of different conditions, medications, and poisons that can cause a fatty liver. Eating too many calories causes fat to build up in the liver. Not processing and breaking fats down normally may also cause this. Certain conditions, such as obesity, diabetes, and high triglycerides also cause this. Most fatty liver patients tend to be middle-aged and over weight.  Some causes of fatty liver are:  Alcohol over consumption.   Malnutrition.   Steroid use.   Valproic acid toxicity.   Obesity.   Cushing's syndrome.   Poisons.   Tetracycline in high dosages.   Pregnancy.   Diabetes.   Hyperlipidemia.   Rapid weight loss.  Some people develop fatty liver even having none of these conditions. SYMPTOMS  Fatty liver most often causes no problems. This is called asymptomatic.  It can be diagnosed with blood tests and also by a liver biopsy.    It is one of the most common causes of minor elevations of liver enzymes on routine blood tests.   Specialized Imaging of the liver using ultrasound, CT (computed tomography) scan, or MRI (magnetic resonance imaging) can suggest a fatty liver but a biopsy is needed to confirm it.   A biopsy involves taking a small sample of liver tissue. This is done by using a needle. It is then looked at under a microscope by a specialist.  TREATMENT  It is important to treat the cause. Simple fatty liver without a medical reason may not need treatment.  Weight loss, fat restriction, and exercise in overweight patients produces inconsistent results but is worth trying.   Fatty liver due to alcohol toxicity may not improve even with stopping drinking.   Good control of diabetes may reduce fatty liver.   Lower your triglycerides through diet, medication or both.   Eat a balanced, healthy diet.   Increase your physical activity.   Get regular checkups from a liver specialist.   There are no medical or surgical treatments for a fatty liver or NASH, but improving your diet and increasing your exercise may help prevent or reverse some of the damage.  PROGNOSIS  Fatty liver may cause no damage or it can lead to an inflammation of the liver. This is, called steatohepatitis. When it is linked to alcohol abuse, it is called alcoholic steatohepatitis. It often is not linked to alcohol. It is then called nonalcoholic steatohepatitis, or NASH. Over   time the liver may become scarred and hardened. This condition is called cirrhosis. Cirrhosis is serious and may lead to liver failure or cancer. NASH is one of the leading causes of cirrhosis. About 10-20% of Americans have fatty liver and a smaller 2-5% has NASH. Document Released: 04/27/2005 Document Revised: 11/22/2010 Document Reviewed: 06/20/2005 [employer] Patient Information 2012 [employer]. 

## 2011-04-30 NOTE — Progress Notes (Signed)
  Subjective:    Patient ID: Hailey Hopkins, female    DOB: 11-Jan-1976, 36 y.o.   MRN: [id number]  HPI Patient is here for followup on recent abdominal ultrasound. Patient was recently seen here in clinic 04/06/11 for a noted right upper quadrant pain. Workup was performed including LFTs, lipid profile, as well as a Abdominal ultrasound. LFT's were within normal limits. Liver profile showed an LDL of 120, otherwise within normal limits. Abdominal ultrasound showed marked fatty liver infiltrate. Patient was instructed to eat a low fat diet to help with this. Today patient states that abdominal pain has minimally improved since last clinical visit. Patient states she's had abdominal discomfort that has caused her to have trouble with sleeping. Abdominal pain sees me most common in the morning before meals. Patient denies any worsening abdominal pain with eating. Patient denies any fatty food intake. Patient states she's eaten the same thing every day. Breakfast includes a protein shake as well as low-fat cottage cheese. Patient states she's eating fruits and vegetables throughout the day. Patient denies any dysuria, flank pain, hematuria, vomiting, diarrhea. Pain has remained localized to the right upper quadrant.   Review of Systems See HPI, otherwise 12 point ROS negative     Objective:   Physical Exam Gen: up in chair, NAD HEENT: NCAT, EOMI, TMs clear bilaterally CV: RRR, no murmurs auscultated PULM: CTAB, no wheezes, rales, rhoncii ABD: obese, + mild TTP in RUQ, no flank pain, no suprapubic pain   EXT: 2+ peripheral pulses         Assessment & Plan:

## 2011-04-30 NOTE — Assessment & Plan Note (Signed)
Will formally refer to gastroenterology for further management. Instructed pt to continue with low fat diet. Will check hepatitis panel. Will defer further testing to gastroenterology.

## 2011-05-01 LAB — HEPATITIS PANEL, ACUTE
HCV Ab: NEGATIVE
Hepatitis B Surface Ag: NEGATIVE

## 2011-07-12 ENCOUNTER — Other Ambulatory Visit: Payer: Self-pay | Admitting: Family Medicine

## 2011-07-13 ENCOUNTER — Other Ambulatory Visit: Payer: Self-pay | Admitting: Family Medicine

## 2011-09-03 ENCOUNTER — Ambulatory Visit: Payer: 59 | Admitting: Family Medicine

## 2011-09-05 ENCOUNTER — Encounter: Payer: Self-pay | Admitting: Family Medicine

## 2011-09-05 ENCOUNTER — Ambulatory Visit (INDEPENDENT_AMBULATORY_CARE_PROVIDER_SITE_OTHER): Payer: 59 | Admitting: Family Medicine

## 2011-09-05 VITALS — BP 118/85 | HR 90 | Ht 66.5 in | Wt 229.0 lb

## 2011-09-05 DIAGNOSIS — K7689 Other specified diseases of liver: Secondary | ICD-10-CM

## 2011-09-05 DIAGNOSIS — K760 Fatty (change of) liver, not elsewhere classified: Secondary | ICD-10-CM

## 2011-09-05 DIAGNOSIS — F321 Major depressive disorder, single episode, moderate: Secondary | ICD-10-CM

## 2011-09-06 ENCOUNTER — Other Ambulatory Visit: Payer: Self-pay | Admitting: Family Medicine

## 2011-09-06 ENCOUNTER — Other Ambulatory Visit: Payer: Self-pay | Admitting: *Deleted

## 2011-09-06 MED ORDER — FLUOXETINE HCL 40 MG PO CAPS: 40.0000 mg | ORAL_CAPSULE | Freq: Every day | ORAL | Status: DC

## 2011-09-06 NOTE — Assessment & Plan Note (Signed)
Will transition from zoloft to prozac given prozac's activating profile. Will continue wellbutrin. Discussed general psych red flags. Pt agreeable to plan. Declined written instructions.

## 2011-09-06 NOTE — Progress Notes (Signed)
  Subjective:    Patient ID: Hailey Hopkins, female    DOB: [birth year], 36 y.o.   MRN: [id number]  HPI Pt here for follow up of mood.  Pt currently on wellbutrin and zoloft.  Pt states that mood has been stable. However, pt states that she has had worsening mood over last 2-3 months.  Pt has finished up full semester at [employer].  Pt states that work load has improved, but has been under stress because her father is currently very sick.  No HI/SI.  Pt has been compliant with medication.  Pt also states that she has not been exercising like she would desire to.    Review of Systems See HPI, otherwise ROS negative.     Objective:   Physical Exam Gen: up in chair, NAD, mildly obese.  HEENT: NCAT, EOMI, TMs clear bilaterally CV: RRR, no murmurs auscultated PULM: CTAB, no wheezes, rales, rhoncii ABD: S/NT/+ bowel sounds  EXT: 2+ peripheral pulses    Assessment & Plan:

## 2011-09-13 MED ORDER — BUPROPION HCL ER (XL) 300 MG PO TB24: 300.0000 mg | ORAL_TABLET | ORAL | Status: DC

## 2011-09-20 ENCOUNTER — Encounter: Payer: Self-pay | Admitting: Family Medicine

## 2011-09-20 ENCOUNTER — Ambulatory Visit (INDEPENDENT_AMBULATORY_CARE_PROVIDER_SITE_OTHER): Payer: 59 | Admitting: Family Medicine

## 2011-09-20 VITALS — BP 142/98 | HR 101 | Ht 66.5 in | Wt 223.5 lb

## 2011-09-20 DIAGNOSIS — F321 Major depressive disorder, single episode, moderate: Secondary | ICD-10-CM

## 2011-09-24 NOTE — Assessment & Plan Note (Signed)
Currently improving on current regimen.  Pt would like to stay at this current dosing of meds.  Will continue.  PHQ-9 score of 2 today.  Will continue to follow as needed.

## 2011-09-24 NOTE — Progress Notes (Signed)
  Subjective:    Patient ID: Hailey Hopkins, female    DOB: 1975/04/24, 36 y.o.   MRN: [id number]  HPI HPI  Pt here for follow up of mood.  Pt currently on wellbutrin and prozac.  Pt was changed from zoloft to prozac as pt reported decreased energy at last clinical visit.  Prozac seemed to be a more activating option for the pt.  Pt states that mood and energy are much improved from last clinical visit.  No HI/SI.  Feels stable on current regimen and would not like to make any changes today.  Is in process of trying to exercise more.   Review of Systems See HPI, otherwise ROS negative     Objective:   Physical Exam Gen: up in chair, NAD HEENT: NCAT, EOMI, TMs clear bilaterally CV: RRR, no murmurs auscultated PULM: CTAB, no wheezes, rales, rhoncii ABD: S/NT/+ bowel sounds  EXT: 2+ peripheral pulses    Assessment & Plan:

## 2011-11-23 ENCOUNTER — Ambulatory Visit (INDEPENDENT_AMBULATORY_CARE_PROVIDER_SITE_OTHER): Payer: 59 | Admitting: Family Medicine

## 2011-11-23 VITALS — BP 139/87 | HR 85 | Temp 98.0°F | Ht 65.5 in | Wt 223.8 lb

## 2011-11-23 DIAGNOSIS — B372 Candidiasis of skin and nail: Secondary | ICD-10-CM

## 2011-11-23 MED ORDER — NYSTATIN 100000 UNIT/GM EX POWD: Freq: Four times a day (QID) | CUTANEOUS | Status: DC

## 2011-11-23 NOTE — Patient Instructions (Signed)
I have sent in a powder to apply to the entire area of your incision 4 times daily. If you continue to have problems, I would recommend getting in to see you OB-GYN to get their opinion on what needs to be done.

## 2011-11-23 NOTE — Progress Notes (Signed)
Patient ID: Hailey Hopkins, female   DOB: 1975/04/13, 36 y.o.   MRN: [id number] Subjective: The patient is a 36 y.o. year old female who presents today for concern for wound infection.  1. Wound infection: Patient had hysterectomy 2 years ago.  Patient noticed several days ago that she had some drainage from incision site.  No fevers/chills.  Does have some erythema.  No significant pruritis or pain, but patient reports minimal feeling in the area since surgery.  Is otherwise feeling well.  Patient's past medical, social, and family history were reviewed and updated as appropriate. History  Substance Use Topics  . Smoking status: Never Smoker   . Smokeless tobacco: Not on file  . Alcohol Use: Not on file   Objective:  Filed Vitals:   11/23/11 0915  BP: 139/87  Pulse: 85  Temp: 98 F (36.7 C)   Gen: NAD, obese Abd: Well healed pfannenstiel incision.  There is an area of erythema with satellite lesions present in the center of the incision.  No incision dehiscence or expressible drainage.  Assessment/Plan: Yeast infection.  Will treat with topical anti-fungal.  If continues to be problematic would recommend return to [facility] for evaluation.  Please also see individual problems in problem list for problem-specific plans.

## 2011-12-03 ENCOUNTER — Ambulatory Visit: Payer: 59 | Admitting: Family Medicine

## 2012-01-13 ENCOUNTER — Other Ambulatory Visit: Payer: Self-pay | Admitting: Family Medicine

## 2012-01-14 NOTE — Telephone Encounter (Signed)
Patient has not been seen here since 2006.

## 2012-01-27 ENCOUNTER — Other Ambulatory Visit: Payer: Self-pay | Admitting: Family Medicine

## 2012-02-07 ENCOUNTER — Encounter: Payer: Self-pay | Admitting: Family Medicine

## 2012-02-07 ENCOUNTER — Ambulatory Visit (INDEPENDENT_AMBULATORY_CARE_PROVIDER_SITE_OTHER): Payer: 59 | Admitting: Family Medicine

## 2012-02-07 VITALS — BP 139/90 | HR 76 | Ht 65.5 in | Wt 226.0 lb

## 2012-02-07 DIAGNOSIS — F321 Major depressive disorder, single episode, moderate: Secondary | ICD-10-CM

## 2012-02-07 DIAGNOSIS — R030 Elevated blood-pressure reading, without diagnosis of hypertension: Secondary | ICD-10-CM

## 2012-02-07 MED ORDER — FLUOXETINE HCL 20 MG PO CAPS: 20.0000 mg | ORAL_CAPSULE | Freq: Every day | ORAL | Status: DC

## 2012-02-07 MED ORDER — FLUOXETINE HCL 40 MG PO CAPS
40.0000 mg | ORAL_CAPSULE | Freq: Every day | ORAL | Status: DC
Start: 2012-02-07 — End: 2013-05-28

## 2012-02-07 MED ORDER — BUPROPION HCL ER (XL) 300 MG PO TB24: 300.0000 mg | ORAL_TABLET | ORAL | Status: DC

## 2012-02-07 NOTE — Assessment & Plan Note (Signed)
139/90 on admit, 128/92 on re-check manually No red flags No Hx of HTN, used underall  For test anxiety in the past.   Monitor and consider treating if persistently above 130/90 Counsel about diet and exercise on next visit.

## 2012-02-07 NOTE — Assessment & Plan Note (Signed)
Stable, would like to increase meds slightly due to decreased energy and sleep disruption Interfering with home life.  No SI/HI  Increased prozac to 60 daily Wellbutrin 300 qd  Re-assess in 2-4 weeks.

## 2012-02-07 NOTE — Patient Instructions (Signed)
Thanks for coming in today!  If you have any big changes in your mood come back to see me right away.   I'd like to see you again in 2 to 4 weeks to see how this increase in medicine is doing.

## 2012-02-07 NOTE — Progress Notes (Signed)
  Subjective:    Patient ID: Hailey Hopkins, female    DOB: 18-Jan-1976, 36 y.o.   MRN: [id number]  HPI Pt here to f/u depression and get med refills  Meds working well but feels like she is beginning to "level out"  Notes decreased energy and multiple awakenings at night.  Work and school going well but note that she has no energy when she's at hiome  PHQ9 score 4, somewhat difficult  No anhedonia, depression, appetite changes, feelings of failure, psychomotor slowing, or suicidality.   No recent headaches, some sinus issues lately.  No sore throat No chest pain or shortness of breath.   Going to school. 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   BP: no Hx, no HA/CP/SOB as above Used inderall for test anxiety    Review of Systems Per hpi     Objective:   Physical Exam  Repeat BP 128/92  Gen: NAD, alert, cooperative with exam HEENT: NCAT, moist oral mucosa, no pharyngeal erythema CV: RRR, no murmur, good s1/s2 Resp: CTABL, no wheezes, non-labored Ext: No edema  Neuro: Alert and oriented, No gross deficits, normal patellar reflexes Psych: appropriate mood and affect. Normal speech, logical thought process, denies SI/HI     Assessment & Plan:

## 2012-04-04 ENCOUNTER — Emergency Department (HOSPITAL_COMMUNITY)
Admission: EM | Admit: 2012-04-04 | Discharge: 2012-04-04 | Disposition: A | Discharge status: Home / Self Care | Payer: 59 | Attending: Emergency Medicine | Admitting: Emergency Medicine

## 2012-04-04 ENCOUNTER — Emergency Department (HOSPITAL_COMMUNITY): Payer: 59

## 2012-04-04 ENCOUNTER — Encounter (HOSPITAL_COMMUNITY): Payer: Self-pay | Admitting: Emergency Medicine

## 2012-04-04 DIAGNOSIS — R112 Nausea with vomiting, unspecified: Secondary | ICD-10-CM | POA: Insufficient documentation

## 2012-04-04 DIAGNOSIS — Z3202 Encounter for pregnancy test, result negative: Secondary | ICD-10-CM | POA: Insufficient documentation

## 2012-04-04 DIAGNOSIS — Z8542 Personal history of malignant neoplasm of other parts of uterus: Secondary | ICD-10-CM | POA: Insufficient documentation

## 2012-04-04 DIAGNOSIS — Z8719 Personal history of other diseases of the digestive system: Secondary | ICD-10-CM | POA: Insufficient documentation

## 2012-04-04 DIAGNOSIS — F329 Major depressive disorder, single episode, unspecified: Secondary | ICD-10-CM | POA: Insufficient documentation

## 2012-04-04 DIAGNOSIS — R509 Fever, unspecified: Secondary | ICD-10-CM | POA: Insufficient documentation

## 2012-04-04 DIAGNOSIS — Z90710 Acquired absence of both cervix and uterus: Secondary | ICD-10-CM | POA: Insufficient documentation

## 2012-04-04 DIAGNOSIS — F3289 Other specified depressive episodes: Secondary | ICD-10-CM | POA: Insufficient documentation

## 2012-04-04 DIAGNOSIS — R1110 Vomiting, unspecified: Secondary | ICD-10-CM

## 2012-04-04 DIAGNOSIS — K838 Other specified diseases of biliary tract: Secondary | ICD-10-CM

## 2012-04-04 DIAGNOSIS — E860 Dehydration: Secondary | ICD-10-CM | POA: Insufficient documentation

## 2012-04-04 HISTORY — DX: Diverticulosis of intestine, part unspecified, without perforation or abscess without bleeding: K57.90

## 2012-04-04 HISTORY — DX: Major depressive disorder, single episode, unspecified: F32.9

## 2012-04-04 HISTORY — DX: Depression, unspecified: F32.A

## 2012-04-04 LAB — URINALYSIS, MICROSCOPIC ONLY
Bilirubin Urine: NEGATIVE
Glucose, UA: NEGATIVE mg/dL
Hgb urine dipstick: NEGATIVE
Specific Gravity, Urine: 1.027 (ref 1.005–1.030)
Urobilinogen, UA: 1 mg/dL (ref 0.0–1.0)
pH: 6 (ref 5.0–8.0)

## 2012-04-04 LAB — CBC WITH DIFFERENTIAL/PLATELET
Basophils Absolute: 0 10*3/uL (ref 0.0–0.1)
Eosinophils Absolute: 0.1 10*3/uL (ref 0.0–0.7)
Eosinophils Relative: 1 % (ref 0–5)
Lymphocytes Relative: 14 % (ref 12–46)
Lymphs Abs: 0.9 10*3/uL (ref 0.7–4.0)
MCH: 31.4 pg (ref 26.0–34.0)
Neutrophils Relative %: 71 % (ref 43–77)
Platelets: 253 10*3/uL (ref 150–400)
RBC: 4.42 MIL/uL (ref 3.87–5.11)
RDW: 12.4 % (ref 11.5–15.5)
WBC: 6.1 10*3/uL (ref 4.0–10.5)

## 2012-04-04 LAB — COMPREHENSIVE METABOLIC PANEL
ALT: 14 U/L (ref 0–35)
AST: 17 U/L (ref 0–37)
Alkaline Phosphatase: 80 U/L (ref 39–117)
Calcium: 9 mg/dL (ref 8.4–10.5)
GFR calc Af Amer: >90 mL/min (ref >90)
Glucose, Bld: 98 mg/dL (ref 70–99)
Potassium: 3.5 mEq/L (ref 3.5–5.1)
Sodium: 134 mEq/L — ABNORMAL LOW (ref 135–145)
Total Protein: 7.4 g/dL (ref 6.0–8.3)

## 2012-04-04 LAB — POCT PREGNANCY, URINE: Preg Test, Ur: NEGATIVE

## 2012-04-04 MED ORDER — MORPHINE SULFATE 4 MG/ML IJ SOLN
4.0000 mg | Freq: Once | INTRAMUSCULAR | Status: AC
  Administered 2012-04-04: 4 mg via INTRAVENOUS
  Filled 2012-04-04: qty 1

## 2012-04-04 MED ORDER — IOHEXOL 300 MG/ML  SOLN
100.0000 mL | Freq: Once | INTRAMUSCULAR | Status: AC | PRN
  Administered 2012-04-04: 100 mL via INTRAVENOUS

## 2012-04-04 MED ORDER — ONDANSETRON HCL 4 MG/2ML IJ SOLN
4.0000 mg | Freq: Once | INTRAMUSCULAR | Status: AC
  Administered 2012-04-04: 4 mg via INTRAVENOUS
  Filled 2012-04-04: qty 2

## 2012-04-04 MED ORDER — SODIUM CHLORIDE 0.9 % IV SOLN: 1000.0000 mL | Freq: Once | INTRAVENOUS | Status: DC

## 2012-04-04 MED ORDER — SODIUM CHLORIDE 0.9 % IV BOLUS (SEPSIS)
1000.0000 mL | Freq: Once | INTRAVENOUS | Status: AC
  Administered 2012-04-04: 1000 mL via INTRAVENOUS

## 2012-04-04 MED ORDER — METOCLOPRAMIDE HCL 10 MG PO TABS: 10.0000 mg | ORAL_TABLET | Freq: Four times a day (QID) | ORAL | Status: DC | PRN

## 2012-04-04 MED ORDER — OXYCODONE-ACETAMINOPHEN 5-325 MG PO TABS: 2.0000 | ORAL_TABLET | ORAL | Status: DC | PRN

## 2012-04-04 NOTE — ED Notes (Signed)
MD at bedside. 

## 2012-04-04 NOTE — ED Provider Notes (Addendum)
History     CSN: [id number]  Arrival date & time 04/04/12  9147   First MD Initiated Contact with Patient 04/04/12 [phone]      Chief Complaint  Patient presents with  . Abdominal Pain    (Consider location/radiation/quality/duration/timing/severity/associated sxs/prior treatment) The history is provided by the patient.  Hailey Hopkins is a 37 y.o. female hx of diverticulosis, depression here with RLQ pain. RLQ pain for 2 days. It was crampy, intermittent, radiate to her back. She also had a fever 102 yesterday. + nausea and vomiting, but able to tolerate fluids. No diarrhea. No urinary symptoms. 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    Past Medical History  Diagnosis Date  . Diverticular disease   . Depression     Past Surgical History  Procedure Date  . Abdominal hysterectomy     No family history on file.  History  Substance Use Topics  . Smoking status: Never Smoker   . Smokeless tobacco: Not on file  . Alcohol Use: No    OB History    Grav Para Term Preterm Abortions TAB SAB Ect Mult Living                  Review of Systems  Gastrointestinal: Positive for nausea, vomiting and abdominal pain.  All other systems reviewed and are negative.    Allergies  Penicillins  Home Medications   Current Outpatient Rx  Name  Route  Sig  Dispense  Refill  . ACETAMINOPHEN 500 MG PO TABS   Oral   Take 1,000 mg by mouth every 6 (six) hours as needed. For pain.         Marland Kitchen BUPROPION HCL ER (XL) 300 MG PO TB24   Oral   Take 1 tablet (300 mg total) by mouth every morning.   30 tablet   6   . FLUOXETINE HCL 20 MG PO CAPS   Oral   Take 1 capsule (20 mg total) by mouth daily.   30 capsule   0   . FLUOXETINE HCL 40 MG PO CAPS   Oral   Take 1 capsule (40 mg total) by mouth daily.   90 capsule   3   . OMEPRAZOLE 20 MG PO CPDR   Oral   Take 20 mg by mouth daily as needed. For indigestion.         Marland Kitchen PROPRANOLOL HCL 10 MG PO  TABS   Oral   Take 10 mg by mouth 2 (two) times daily as needed. For migraines.           BP 137/93  Pulse 112  Temp 98.7 F (37.1 C) (Oral)  Resp 22  SpO2 100%  Physical Exam  Nursing note and vitals reviewed. Constitutional: She is oriented to person, place, and time. She appears well-developed.       Uncomfortable   HENT:  Head: Normocephalic.       MM dry   Eyes: Conjunctivae normal are normal. Pupils are equal, round, and reactive to light.  Neck: Normal range of motion. Neck supple.  Cardiovascular: Regular rhythm and normal heart sounds.        Tachy   Pulmonary/Chest: Effort normal and breath sounds normal. No respiratory distress. She has no wheezes. She has no rales.  Abdominal: Soft.       + RLQ tenderness, no rebound   Musculoskeletal: Normal range of motion.  Neurological: She is alert and oriented to person,  place, and time.  Skin: Skin is warm and dry.  Psychiatric: She has a normal mood and affect. Her behavior is normal. Judgment and thought content normal.    ED Course  Procedures (including critical care time)  Labs Reviewed  CBC WITH DIFFERENTIAL - Abnormal; Notable for the following:    Monocytes Relative 13 (*)     All other components within normal limits  COMPREHENSIVE METABOLIC PANEL - Abnormal; Notable for the following:    Sodium 134 (*)     GFR calc non Af Amer 80 (*)     All other components within normal limits  URINALYSIS, MICROSCOPIC ONLY - Abnormal; Notable for the following:    APPearance CLOUDY (*)     Bacteria, UA MANY (*)     Squamous Epithelial / LPF MANY (*)     All other components within normal limits  LIPASE, BLOOD  POCT PREGNANCY, URINE   US Abdomen Complete  04/04/2012  *RADIOLOGY REPORT*  Clinical Data:  Right upper quadrant abdominal pain.  Dilated common bile duct on CT scan.  COMPLETE ABDOMINAL ULTRASOUND  Comparison:  04/04/2012; 04/10/2011  Findings:  Gallbladder:  No gallstones, gallbladder wall thickening, or  pericholecystic fluid.  Common bile duct:  Measures 8 mm in diameter.  Distal CBD is not well visualized due to overlying bowel gas.  I do not observe a definite stone in the common bile duct.  Liver:  Echogenic with coarse echotexture, favoring steatosis.  IVC:  Appears normal.  Pancreas:  Not well shown due to overlying bowel gas.  Spleen:  Measures 8.6 cm in length and appears normal.  Right Kidney:  Measures 11.0 cm in length and appears unremarkable.  Left Kidney:  Measures 12.4 cm in length and appears normal.  Abdominal aorta:  No aneurysm identified.  IMPRESSION:  1.  Ultrasound confirms CBD dilatation, without intrahepatic bile duct dilatation.  However, no definite choledocholithiasis is visualized. 2.  Hepatic steatosis.   Original Report Authenticated By: Gaylyn Rong, M.D.    Ct Abdomen Pelvis W Contrast  04/04/2012  *RADIOLOGY REPORT*  Clinical Data: Right abdominal pain.  Nausea and vomiting.  CT ABDOMEN AND PELVIS WITH CONTRAST  Technique:  Multidetector CT imaging of the abdomen and pelvis was performed following the standard protocol during bolus administration of intravenous contrast.  Contrast: OMNIPAQUE IOHEXOL 300 MG/ML  SOLN  Comparison: 04/10/2011 ultrasound  Findings: Mild dependent atelectasis noted in the right lower lobe and right middle lobe.  Faint diffuse heterogeneity of the liver is probably from mild steatosis.  The spleen, adrenal glands, and pancreas appear unremarkable.  The common bile duct appears dilated at approximately 7 mm. Gallbladder unremarkable.  The kidneys appear unremarkable, as do the proximal ureters.  No pathologic retroperitoneal or porta hepatis adenopathy is identified.  Appendix normal.  Left ovarian cyst measures up to 3.3 cm in diameter.  Uterus absent.  Urinary bladder unremarkable.  No free pelvic fluid noted.  Mild sigmoid diverticulosis.  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 Clitoral piercing visualized.   IMPRESSION:  1.  Dilated common bile duct at approximately 7 mm.  I cannot exclude distal obstruction. 2.  Faint hepatic heterogeneity favors steatosis. 3.  Disc osteophyte complex at L1-2 causes central stenosis. 4.  Mild sigmoid diverticulosis. 5.  Left ovarian cyst measures up to 3.3 cm in diameter. If the patient's pain is pelvic in origin, consider pelvic ultrasound.   Original Report Authenticated By: Gaylyn Rong, M.D.  No diagnosis found.    MDM  Hailey Hopkins is a 37 y.o. female here with ab pain, fever, RLQ tenderness. I think although she likely has viral syndrome, but I am concerned about possible appendicitis. Will do labs and CT ab/pel and reassess.  1:42 PM CT ab/pel showed dilated CBD. [place] RUQ showed dilated CBD but no cholecystitis. She is not showing signs of cholangitis. Abdomen more soft and less tender on RLQ, no RUQ tenderness or murphy's sign. Labs unremarkable, LFTs nl. UA contaminated and she has no symptoms. I still think that she has viral syndrome. Will d/c home with reglan prn and motrin, percocet prn. Will have her f/u with surgery regarding dilated CBD.      Richardean Canal, MD 04/04/12 1345  Richardean Canal, MD 04/04/12 1346

## 2012-04-04 NOTE — ED Notes (Signed)
Pt presenting to ed with c/o abdominal pain right side. Pt states she has nausea and vomiting no diarrhea. Pt states she has had constipation for the last 2-3 days. Pt states decreased appetite. Pt states history of diverticulosis

## 2012-04-07 ENCOUNTER — Ambulatory Visit (INDEPENDENT_AMBULATORY_CARE_PROVIDER_SITE_OTHER): Payer: 59 | Admitting: Family Medicine

## 2012-04-07 VITALS — BP 124/92 | HR 82 | Temp 98.1°F | Ht 66.5 in | Wt 223.0 lb

## 2012-04-07 DIAGNOSIS — R109 Unspecified abdominal pain: Secondary | ICD-10-CM

## 2012-04-07 DIAGNOSIS — R1031 Right lower quadrant pain: Secondary | ICD-10-CM | POA: Insufficient documentation

## 2012-04-07 MED ORDER — OXYCODONE-ACETAMINOPHEN 5-325 MG PO TABS: 2.0000 | ORAL_TABLET | ORAL | Status: DC | PRN

## 2012-04-07 MED ORDER — KETOROLAC TROMETHAMINE 60 MG/2ML IM SOLN
60.0000 mg | Freq: Once | INTRAMUSCULAR | Status: AC
  Administered 2012-04-07: 60 mg via INTRAMUSCULAR

## 2012-04-07 NOTE — Patient Instructions (Addendum)
I have refilled percocet  You got a shot of Toradol today- high strength NSAID related to ibuprofen or aleve- ok to take some ibuprofen or aleve with food starting tomorrow.  Will make appointment with your GI doctor  If you have worsening pain, fever, unable to keep foods down, or other concerns, please go to ER

## 2012-04-07 NOTE — Progress Notes (Signed)
  Subjective:    Patient ID: Hailey Hopkins, female    DOB: [birth year], 37 y.o.   MRN: [id number]  HPI Here for ER follow-up of abdominal pain  Wednesday: constipation with right low abdomen and back pain.  Two days later On Jan 10, went to the ER for evaluation.  CT shows dilated CBD to 7 mm and follow-up US confirmed this at 8 mm,  No stone seen.  LFT's and lipase were normal.  Some liver steatosis, patient previously known- states had eval with Dr. Elnoria Howard last year for this.  Was discharged with percocet and and Reglan.  Pain has not improved, taking percocet several times per day.  No nausea in the past few days.  No diarrhea.  Has not had regular bowel movements.  Has not been eating well- a little but of soup and crackers for the past few days.  I have reviewed patient's  PMH, FH, and Social history and Medications as related to this visit. History of diverticulosis, surgical history of c/s and hysterectomy for ? Cervical cancer.   Review of Systems No fever, no dysuria.    Objective:   Physical Exam GEN: Alert & Oriented, No acute distress CV:  Regular Rate & Rhythm, no murmur Respiratory:  Normal work of breathing, CTAB Abd:  + BS, soft, TTP in Right lower quadrant.   No rebound or guarding. Ext: no pre-tibial edema        Assessment & Plan:

## 2012-04-07 NOTE — Assessment & Plan Note (Signed)
ER eval showed normal LFT's, lipase, WBC.  CT and subsequent US showed CBD dilation with no gallstone.  Labs/imaging eassuring for acute inflammation or infection.  Discussed with patient that dilated CBD could be due to poor motility or small stone unable to be visualized.  Will have her return to see GI- their office suggested patient call to make an appointment herself.  Also discussed that although has had hysterectomy, pain could be from right ovary.  Declines pelvic exam today.  This was not visualized on US done in ER.  Advised her if she changes her mind and would like to rule out ovarian cause, to call and I would be glad to order a pelvic ultrasound.  Refilled percocet, gave toradol 60 mg IM today in office.

## 2012-04-18 ENCOUNTER — Ambulatory Visit (INDEPENDENT_AMBULATORY_CARE_PROVIDER_SITE_OTHER): Payer: 59 | Admitting: Family Medicine

## 2012-04-18 ENCOUNTER — Ambulatory Visit: Payer: 59 | Admitting: Family Medicine

## 2012-04-18 ENCOUNTER — Encounter: Payer: Self-pay | Admitting: Family Medicine

## 2012-04-18 ENCOUNTER — Other Ambulatory Visit (HOSPITAL_COMMUNITY)
Admission: RE | Admit: 2012-04-18 | Discharge: 2012-04-18 | Disposition: A | Discharge status: Home / Self Care | Payer: 59 | Source: Ambulatory Visit | Attending: Family Medicine | Admitting: Family Medicine

## 2012-04-18 VITALS — BP 144/94 | HR 94 | Temp 98.8°F | Ht 66.5 in | Wt 224.0 lb

## 2012-04-18 DIAGNOSIS — R1011 Right upper quadrant pain: Secondary | ICD-10-CM

## 2012-04-18 DIAGNOSIS — R1031 Right lower quadrant pain: Secondary | ICD-10-CM

## 2012-04-18 DIAGNOSIS — R109 Unspecified abdominal pain: Secondary | ICD-10-CM

## 2012-04-18 DIAGNOSIS — Z113 Encounter for screening for infections with a predominantly sexual mode of transmission: Secondary | ICD-10-CM | POA: Insufficient documentation

## 2012-04-18 LAB — POCT WET PREP (WET MOUNT)

## 2012-04-18 NOTE — Patient Instructions (Addendum)
It was good to meet you today though I'm sorry you're not feeling well. - It seems most likely that your pain is musculoskeletal.  I will order a right hip x-ray and refer you to Sports Medicine who can better evaluate if this is musculoskeletal and what is causing it. - I will call you if your results from today's tests are abnormal.  Please seek immediate medical attention if your pain is uncontrollable, you develop fever and worsening pain, or any other concerning signs.   - Continue what you are doing for pain, and try to avoid sleeping on that side.

## 2012-04-19 NOTE — Progress Notes (Signed)
Subjective:     Patient ID: Hailey Hopkins, female   DOB: [birth year], 37 y.o.   MRN: [id number]  CC - Hip feels no better  HPI  Hailey Hopkins is a 37 y.o. female with h/o obesity, depression, and recent RLQ abdominal pain for which she was in the ED 1/10.  At that time, she had normal LFT, lipase, WBC, and abdominal CT and subsequent US showing common bile duct dilation with no stone, all making acute inflammation/infection less likely.  On office visit 1/13, discussed plan for patient to return to see GI to evaluate for chance of poor motility or small gallstone unable to be visualized.  Also discussed that pain could be from right ovary, though pt had declined pelvic at that visit.  Ovary not visualized on US done in ER. Pt was given percocet and toradol 60mg  IM in office.   Today, she reports pain is unchanged and she describes it today as right hip pain that extends slightly into posterior hip and anterior hip/inguinal region.  Pain has lasted 3 weeks and is constant, sharp/throbbing if pressure put on it, worse with walking/standing/lying on right side, and not improved with toradol in office, percocet, alieve, or ice/heat.  Denies falls/trauma/heavy lifting hx, fever, chills, dysuria, numbness/tingling, weakness, bowel/bladder dysfunction, radiation/shooting pain, swelling/erythema.  Does report h/o stomach bug 2 weeks ago and current nausea she thinks is due to pain.  Saw GI specialist who she reports does not think it current sx's are of GI origin.  Review of Systems  Per HPI.  PMH, SH, and FH reviewed with no changes.    Objective:   Physical Exam BP 144/94  Pulse 94  Temp 98.8 F (37.1 C) (Oral)  Ht 5' 6.5" (1.689 m)  Wt 224 lb (101.606 kg)  BMI 35.61 kg/m2 GEN: Appears uncomfortable but NAD, sitting in chair ABD: soft, nontender, nondistended, obese; mild inguinal TTP; no right inguinal hernia appreciated on exam MSK: - Lumbar spine and right hip with no deformity,  erythema, or swelling;  - great difficulty getting up to stand or laying down;  - limited ROM to flexion and extension of lumbar spine due to pain (to about 10 degrees either way);  - Gait slow and stiff-appearing but otherwise WNL - Tenderness at right hip with knee extension and flexion against resistance and with hip external rotation - Right and left straight leg raise negative GU: - Normal vaginal mucosa and no tenderness or abnormality noted on speculum exam, except that cervix is absent and moderate amount of white discharge; no mass or tenderness on bimanual exam    Assessment:     Hailey Hopkins is a 37 y.o. female with h/o obesity, depression, and 3 weeks of right-sided hip/inguinal region pain with extensive workup that has been negative so far.     Plan:

## 2012-04-19 NOTE — Assessment & Plan Note (Addendum)
Now appears to be more right-sided hip pain, though some involvement of right inguinal region on exam.  Extensive workup including LFTs, CBC, lipase, abdominal CT and [place], and ambulatory GI referral have been negative so far.  Today symptoms appear to be MSK related (hip intra-articular issue v less likely trochanteric bursitis or bone injury), though differential also includes sciatica, hernia, GC/Chlamydia salpingitis, or ovarian issue. - Right hip x-ray ordered - Sports Medicine referral made for further evaluation for MSK etiology of symptoms - GC/Chlamydia and wet prep performed and pending - Continue current pain management with rest, avoid lying on affected side, alieve and percocet prn - Return precautions reviewed - Patient in agreement with plan

## 2012-04-22 ENCOUNTER — Telehealth: Payer: Self-pay | Admitting: Family Medicine

## 2012-04-22 NOTE — Telephone Encounter (Signed)
Attempted to call patient to let her know wet prep and GC/Chlamydia results (wet prep positive for BV, other testing negative).  Left message on cell phone for patient to call back for results and that not urgent.

## 2012-04-23 ENCOUNTER — Telehealth: Payer: Self-pay | Admitting: Family Medicine

## 2012-04-23 NOTE — Telephone Encounter (Signed)
Called for second time now and could not reach patient.  Left message to call front office.  Please call and let patient know recent visit tests were negative except it looks like she may have bacterial vaginosis, and if she desires I could send a prescription for metronidazole 500mg  BID PO x 7 days to clear this up.  She may only notice increased malodorous discharge. If she would like prescription, let me know and recommend to her to not drink alcohol while using.  Simone Curia 04/23/2012 4:22 PM

## 2012-04-24 ENCOUNTER — Telehealth: Payer: Self-pay | Admitting: Family Medicine

## 2012-04-24 MED ORDER — METRONIDAZOLE 500 MG PO TABS: 500.0000 mg | ORAL_TABLET | Freq: Two times a day (BID) | ORAL | Status: AC

## 2012-04-24 NOTE — Telephone Encounter (Signed)
Called and left message on voice mail to return call.

## 2012-04-24 NOTE — Telephone Encounter (Signed)
Patient notified

## 2012-04-24 NOTE — Telephone Encounter (Signed)
Called and discussed with patient that metronidazole is at pharmacy and she can pick up now or if she develops foul-smelling discharge, and to abstain from alcohol while on medication.  Pt in agreement.

## 2012-04-24 NOTE — Telephone Encounter (Signed)
Ordered metronidazole upon patient request after wet prep showed many clue cells, and sent prescription to [facility] at [facility] [address] and [address].  Please call to let patient know it is sent.  Thank you.

## 2012-04-24 NOTE — Telephone Encounter (Signed)
Patient will be in class at 11:30 to after 1 so please call her back before 11:30.

## 2012-04-24 NOTE — Telephone Encounter (Signed)
Spoke with patient and gave message from  Dr. Benjamin Stain. Patient does want treatment. Will  route back to Dr. Benjamin Stain.

## 2012-05-06 ENCOUNTER — Ambulatory Visit: Payer: 59 | Admitting: Sports Medicine

## 2012-08-22 ENCOUNTER — Ambulatory Visit (HOSPITAL_COMMUNITY)
Admission: RE | Admit: 2012-08-22 | Discharge: 2012-08-22 | Disposition: A | Discharge status: Home / Self Care | Payer: 59 | Source: Ambulatory Visit | Attending: Family Medicine | Admitting: Family Medicine

## 2012-08-22 ENCOUNTER — Ambulatory Visit (INDEPENDENT_AMBULATORY_CARE_PROVIDER_SITE_OTHER): Payer: 59 | Admitting: Family Medicine

## 2012-08-22 ENCOUNTER — Encounter: Payer: Self-pay | Admitting: Family Medicine

## 2012-08-22 ENCOUNTER — Telehealth: Payer: Self-pay | Admitting: *Deleted

## 2012-08-22 VITALS — BP 142/94 | HR 92 | Temp 98.0°F | Ht 66.5 in | Wt 234.7 lb

## 2012-08-22 DIAGNOSIS — R059 Cough, unspecified: Secondary | ICD-10-CM | POA: Insufficient documentation

## 2012-08-22 DIAGNOSIS — R079 Chest pain, unspecified: Secondary | ICD-10-CM | POA: Insufficient documentation

## 2012-08-22 DIAGNOSIS — R05 Cough: Secondary | ICD-10-CM | POA: Insufficient documentation

## 2012-08-22 DIAGNOSIS — R0989 Other specified symptoms and signs involving the circulatory and respiratory systems: Secondary | ICD-10-CM | POA: Insufficient documentation

## 2012-08-22 MED ORDER — LORATADINE 10 MG PO TABS: 10.0000 mg | ORAL_TABLET | Freq: Every day | ORAL | Status: AC

## 2012-08-22 MED ORDER — BENZONATATE 200 MG PO CAPS: 200.0000 mg | ORAL_CAPSULE | Freq: Two times a day (BID) | ORAL | Status: DC | PRN

## 2012-08-22 NOTE — Assessment & Plan Note (Addendum)
A: Cough  I suspect that she is having post-viral cough. I have considered pneumonia, TB, asthma GERD, allergic rhinitis but there is little supporting evidence of any of these.   P:  Antitussives per medication orders. Chest x-ray. Trial of antihistamines.  If no improvement with antihistamine consider treating for reflux with PPI, consider checking a PPD.

## 2012-08-22 NOTE — Telephone Encounter (Signed)
Pt informed. [name], Hailey Hopkins  

## 2012-08-22 NOTE — Progress Notes (Signed)
Patient ID: Hailey Hopkins, female   DOB: 1975/05/11, 37 y.o.   MRN: [id number] Subjective:     Hailey Hopkins is a 37 y.o. female here for evaluation of a cough.  The cough is generally non-productive but is occasionally productive and is aggravated by nothing. Onset of symptoms was 3 weeks ago.  She did have a fever at onset to 101. She went to urgent care in [place], [place] while she was visiting her parents and was prescribed a 5 day course of azithromycin for sinusitis. She completed the azithromycin  4 days ago, the cough is unchanged.  Associated symptoms include chest pain and shortness of breath while coughing. The cough is worse at night. She denies reflux and heartburn. Patient does not have a history of asthma. Patient has not had recent travel. Patient does not have a history of smoking. She works at [employer], so she does have possible sick contacts. Patient has not had a previous chest x-ray. Patient has not had a PPD done.  Review of Systems Pertinent items are noted in HPI.     Objective:   BP 142/94  Pulse 92  Temp(Src) 98 F (36.7 C) (Oral)  Ht 5' 6.5" (1.689 m)  Wt 234 lb 11.2 oz (106.459 kg)  BMI 37.32 kg/m2 General appearance: alert, cooperative and no distress Eyes: conjunctivae/corneas clear. PERRL, EOM's intact. Ears: normal TM's and external ear canals both ears Nose: Nares normal. Septum midline. Mucosa normal. No drainage or sinus tenderness. Throat: lips, mucosa, and tongue normal; teeth and gums normal Neck: no adenopathy, no carotid bruit, no JVD and supple, symmetrical, trachea midline Lungs: clear to auscultation bilaterally Heart: regular rate and rhythm, S1, S2 normal, no murmur, click, rub or gallop    Assessment:    Cough  I suspect that she is having post-viral cough. I have considered pneumonia, TB, asthma GERD, allergic rhinitis but there is little supporting evidence of any of these.    Plan:    Antitussives per medication  orders. Chest x-ray. Trial of antihistamines.

## 2012-08-22 NOTE — Patient Instructions (Addendum)
Ms. Tenbrink,  Thank you for coming in today.  Your exam is normal but I would like to work up your cough while treating it.   Treatment: Tessalon perles as needed Claritin daily   Work up: Chest x-ray: [facility].  If x-ray is normal take new medication, f/u in 3 weeks with Dr. Ermalinda Memos. Schedule appt at check out. If x-ray is abnormal I will call you and let you know if f/u is needed sooner or additional medication.  Dr. Armen Pickup

## 2012-08-22 NOTE — Telephone Encounter (Signed)
Message copied by Osborne Oman on Fri Aug 22, 2012  1:52 PM ------      Message from: Dessa Phi      Created: Fri Aug 22, 2012 11:16 AM       CXR negative.      Please inform patient.      Continue instructions per today's  AVS. ------

## 2012-09-24 ENCOUNTER — Encounter: Payer: Self-pay | Admitting: Family Medicine

## 2012-09-24 ENCOUNTER — Ambulatory Visit (INDEPENDENT_AMBULATORY_CARE_PROVIDER_SITE_OTHER): Payer: 59 | Admitting: Family Medicine

## 2012-09-24 VITALS — BP 135/94 | HR 99 | Temp 98.1°F | Ht 66.5 in | Wt 234.0 lb

## 2012-09-24 DIAGNOSIS — R0781 Pleurodynia: Secondary | ICD-10-CM | POA: Insufficient documentation

## 2012-09-24 DIAGNOSIS — R059 Cough, unspecified: Secondary | ICD-10-CM

## 2012-09-24 DIAGNOSIS — R05 Cough: Secondary | ICD-10-CM

## 2012-09-24 DIAGNOSIS — R079 Chest pain, unspecified: Secondary | ICD-10-CM

## 2012-09-24 MED ORDER — TRAMADOL HCL 50 MG PO TABS: 50.0000 mg | ORAL_TABLET | Freq: Three times a day (TID) | ORAL | Status: DC | PRN

## 2012-09-24 MED ORDER — PANTOPRAZOLE SODIUM 40 MG PO TBEC: 40.0000 mg | DELAYED_RELEASE_TABLET | Freq: Every day | ORAL | Status: DC

## 2012-09-24 NOTE — Assessment & Plan Note (Signed)
Likely 2/2 cough,  - rib fracture unlikely with PE, mild tenderness to palaption of entire area - probable muscle strain.  - NSAID and tylenol not helping, will try tramadol  Tramadol 50 mg X 30 sent

## 2012-09-24 NOTE — Patient Instructions (Signed)
It was nice to meet you again  Lets follow up for cough in 2 weeks  Lets try this PPI for 6 weeks, you should have relief in 1 week if this is the problem I have sent tramadol 50 mg TID to your pharmacy

## 2012-09-24 NOTE — Assessment & Plan Note (Addendum)
Continued tp worsened cough, worse at night making me feel that it is likely GERD related TB possible with healthcare exposure but no sweats, weight loss, fevers- will do PPD if no imoprovement with PPI CXR previously collected clear, no Hx of asthma and no wheeze Start PPI, explained it will take time for damaged mucosa to heal  protonix 40 mg daily

## 2012-09-24 NOTE — Progress Notes (Signed)
  Subjective:    Patient ID: Hailey Hopkins, female    DOB: October 22, 1975, 37 y.o.   MRN: [id number]  HPI Pt here for follow up of cough. Tessalon Perles and Claritin not helping.  Describes 4 weeks of dry cough, worse with lying down and sleeping, interrupting her sleep, No obvious sick contacts but works at [employer]  No fever since the beginning of her illness 4 weeks ago when she got azithromycin at [facility]. No weight loss or night sweats.   Now having severe R rib pain with movement or cough. No help from tylenol or advil 800 mg.    Review of Systems Per HPI + fever 4 weeks ago,  No recent fever, + dry cough,  NO dyspnea, or chest pain    Objective:   Physical Exam  Gen: NAD, alert, cooperative with exam HEENT: NCAT, EOMI, PERRL, oropharynx clear, no nasal congestion, MMM CV: RRR, good S1/S2, no murmur Resp: CTABL, no wheezes, non-labored Neuro: Alert and oriented, No gross deficits MSK: tenderness to palpation of R side in mid-axillary area and right central back, no point tenderness      Assessment & Plan:

## 2012-10-10 ENCOUNTER — Ambulatory Visit: Payer: 59 | Admitting: Family Medicine

## 2012-12-18 ENCOUNTER — Ambulatory Visit: Payer: 59 | Admitting: Family Medicine

## 2013-01-01 ENCOUNTER — Ambulatory Visit: Payer: 59 | Admitting: Family Medicine

## 2013-02-02 ENCOUNTER — Encounter: Payer: Self-pay | Admitting: Family Medicine

## 2013-02-02 ENCOUNTER — Ambulatory Visit (INDEPENDENT_AMBULATORY_CARE_PROVIDER_SITE_OTHER): Payer: 59 | Admitting: Family Medicine

## 2013-02-02 VITALS — BP 154/102 | HR 72 | Temp 97.0°F | Wt 238.0 lb

## 2013-02-02 DIAGNOSIS — B079 Viral wart, unspecified: Secondary | ICD-10-CM

## 2013-02-02 DIAGNOSIS — B078 Other viral warts: Secondary | ICD-10-CM

## 2013-02-02 DIAGNOSIS — L918 Other hypertrophic disorders of the skin: Secondary | ICD-10-CM | POA: Insufficient documentation

## 2013-02-02 MED ORDER — MUPIROCIN 2 % EX OINT: 1.0000 "application " | TOPICAL_OINTMENT | Freq: Two times a day (BID) | CUTANEOUS | Status: DC

## 2013-02-02 NOTE — Assessment & Plan Note (Signed)
Removed Largest appeared infarcted vs infected, mupirocin BID advised and Rx sent.  Routine wound care discussed

## 2013-02-02 NOTE — Progress Notes (Signed)
  Subjective:    Patient ID: Hailey Hopkins, female    DOB: 06/19/1975, 37 y.o.   MRN: [id number]  HPI  Patient here for SDA appointment for painful wart on her neck  States that the wart is been there for several years approximately 5 mm in diameter previously, but began to grow about one month ago and began to get painful and bleed about 1-1/2 weeks ago.   She has no history of skin cancer She has several warts on around her neck and has for several years. This has never happened before  Would like to discuss migraines at her next visit.   Review of Systems Per hpi    Objective:   Physical Exam  Gen: NAD, alert, cooperative with exam HEENT: NCAT Resp: non-labored Skin :  Pea sized erythematous skin tag on central neck, no surrounding induration or warmth  4 mm hyperpigmented skin tag, 1 mm flesh colored skin tag with stalk, and 3-4 mm flesh colored skin tag on post neck,  without erythema, tenderness, induration or warmth      Assessment & Plan:   Procedure All 4 skin tags removed Each was prepped with alcohol, gently picked up with forceps, numbed with ethyl chloride spray, and clipped with scissors or #11 scalpel, and bleeding controlled with monsel's solution. Each had a sterile bandage placed on top.    See prob specific a/p  Murtis Sink, MD [facility] Resident, PGY-2 02/02/2013, 4:59 PM

## 2013-02-02 NOTE — Patient Instructions (Signed)
Wound Care Wound care helps prevent pain and infection.  You may need a tetanus shot if:  You cannot remember when you had your last tetanus shot.  You have never had a tetanus shot.  The injury broke your skin. If you need a tetanus shot and you choose not to have one, you may get tetanus. Sickness from tetanus can be serious. HOME CARE   Only take medicine as told by your doctor.  Clean the wound daily with mild soap and water.  Change any bandages (dressings) as told by your doctor.  Put medicated cream and a bandage on the wound as told by your doctor.  Change the bandage if it gets wet, dirty, or starts to smell.  Take showers. Do not take baths, swim, or do anything that puts your wound under water.  Rest and raise (elevate) the wound until the pain and puffiness (swelling) are better.  Keep all doctor visits as told. GET HELP RIGHT AWAY IF:   Yellowish-white fluid (pus) comes from the wound.  Medicine does not lessen your pain.  There is a red streak going away from the wound.  You have a fever. MAKE SURE YOU:   Understand these instructions.  Will watch your condition.  Will get help right away if you are not doing well or get worse. Document Released: 12/20/2007 Document Revised: 06/04/2011 Document Reviewed: 07/16/2010 Exit[employer] Patient Information 2014 [employer].  

## 2013-02-09 ENCOUNTER — Ambulatory Visit: Payer: 59 | Admitting: Family Medicine

## 2013-04-23 ENCOUNTER — Encounter: Payer: Self-pay | Admitting: Family Medicine

## 2013-04-23 ENCOUNTER — Telehealth: Payer: Self-pay | Admitting: Psychology

## 2013-04-23 ENCOUNTER — Ambulatory Visit (INDEPENDENT_AMBULATORY_CARE_PROVIDER_SITE_OTHER): Payer: 59 | Admitting: Family Medicine

## 2013-04-23 VITALS — BP 146/105 | HR 78 | Ht 66.5 in | Wt 241.0 lb

## 2013-04-23 DIAGNOSIS — G43909 Migraine, unspecified, not intractable, without status migrainosus: Secondary | ICD-10-CM

## 2013-04-23 DIAGNOSIS — F321 Major depressive disorder, single episode, moderate: Secondary | ICD-10-CM

## 2013-04-23 DIAGNOSIS — R030 Elevated blood-pressure reading, without diagnosis of hypertension: Secondary | ICD-10-CM

## 2013-04-23 MED ORDER — VENLAFAXINE HCL ER 37.5 MG PO CP24: 37.5000 mg | ORAL_CAPSULE | Freq: Every day | ORAL | Status: DC

## 2013-04-23 MED ORDER — FLUOXETINE HCL 20 MG PO TABS: ORAL_TABLET | ORAL | Status: DC

## 2013-04-23 NOTE — Patient Instructions (Signed)
Great to see you COme back in 2-3 weeks  Decrease prozac to 40 mg for 10 days, then 20 mg for 10 days Start effexor 2 weeks from tomorrow    I recommend that you meet with our psychologist, Dr. Spero GeraldsMichelle Kane, for help dealing with your depression.  You can schedule an appointment with her by calling her directly at [phone].

## 2013-04-23 NOTE — Assessment & Plan Note (Signed)
Likely exacerbated by sleep deprivation and worsened depression Has tried amitriptyline, beta blockers in the past and has had good success with Topamax I'm hesitant to start Topamax and all these changes are taking place She may benefit from starting Effexor which were already doing for her mood If no improvement after 4 weeks of Effexor treatment I'll gladly start Topamax

## 2013-04-23 NOTE — Progress Notes (Signed)
Patient ID: Hailey Hopkins, female   DOB: 10-23-1975, 38 y.o.   MRN: [id number]  Kevin Fenton, MD Phone: [phone]  Subjective:  Chief complaint-noted  Patient here for followup for depression, and migraines  Depression She states that for the past 3 months or so she's had worsening symptoms of depressed mood and trouble sleeping. She feels like it's exacerbated by her current situation. Her father has congestive heart failure, ESRD, and diabetes and is now refusing dialysis and on hospice care for about one week. This has been a very stressful time for her and difficult to deal with. She states that her symptoms were clearly worse even before the stress of her father's illness.  She previously got some benefit from Prozac to feel that benefit has waned, she's an 80 mg daily She denies suicidal ideation She describes sleeping problems for approximately 3 months. She states she is able to go to sleep and then wakes up in the early morning hours around 2 or 3, and cannot go back to sleep. She describes that she is still sleepy and feels tired all the time.  Headaches States she's had migraines for several years, she is previously very well controlled on Topamax but was weaned off because she hadn't had migraines in 5 years. She's had a hysterectomy. She states she has one to 2 headaches weekly that last 12-24 hours each. Described as right-sided severe throbbing headaches worsened by sounds and smells and associated with nausea. She takes Excedrin at the beginning of her headaches which helped some.   ROS- No fever, chills, sweats No dyspnea No chest pain No abdominal pain  Past Medical History Patient Active Problem List   Diagnosis Date Noted  . Skin tag 02/02/2013  . Rib pain 09/24/2012  . Cough 08/22/2012  . Hepatic steatosis 04/12/2011  . Back pain 06/16/2010  . HEMORRHOIDS, EXTERNAL 12/21/2009  . VAGINAL BLEEDING 06/11/2008  . DEPRESSION, MAJOR, MODERATE 04/04/2007    . HAIR LOSS 08/23/2006  . OBESITY NOS 05/28/2006  . FATIGUE 05/28/2006  . ELEVATED BLOOD PRESSURE WITHOUT DIAGNOSIS OF HYPERTENSION 05/28/2006  . GLUCOSE INTOLERANCE 05/23/2006  . MIGRAINE, UNSPEC., W/O INTRACTABLE MIGRAINE 05/23/2006    Medications- reviewed and updated Current Outpatient Prescriptions  Medication Sig Dispense Refill  . acetaminophen (TYLENOL) 500 MG tablet Take 1,000 mg by mouth every 6 (six) hours as needed. For pain.      Marland Kitchen buPROPion (WELLBUTRIN XL) 300 MG 24 hr tablet Take 1 tablet (300 mg total) by mouth every morning.  30 tablet  6  . FLUoxetine (PROZAC) 20 MG tablet 40 mg daily for 10 days, Then 20 mg daily for 10 days.  Start effexor after 5 days of 20 mg dose  30 tablet  0  . FLUoxetine (PROZAC) 40 MG capsule Take 1 capsule (40 mg total) by mouth daily.  90 capsule  3  . loratadine (CLARITIN) 10 MG tablet Take 1 tablet (10 mg total) by mouth daily.  30 tablet  1  . mupirocin ointment (BACTROBAN) 2 % Place 1 application into the nose 2 (two) times daily.  22 g  0  . pantoprazole (PROTONIX) 40 MG tablet Take 1 tablet (40 mg total) by mouth daily.  30 tablet  1  . propranolol (INDERAL) 10 MG tablet Take 10 mg by mouth 2 (two) times daily as needed. For migraines.      . traMADol (ULTRAM) 50 MG tablet Take 1 tablet (50 mg total) by mouth every 8 (eight) hours as  needed for pain.  30 tablet  0  . venlafaxine XR (EFFEXOR XR) 37.5 MG 24 hr capsule Take 1 capsule (37.5 mg total) by mouth daily with breakfast.  30 capsule  0   No current facility-administered medications for this visit.    Objective: BP 146/105  Pulse 78  Ht 5' 6.5" (1.689 m)  Wt 241 lb (109.317 kg)  BMI 38.32 kg/m2 Gen: NAD, alert, cooperative with exam HEENT: NCAT, EOMI, PERRL CV: RRR, good S1/S2, no murmur Resp: CTABL, no wheezes, non-labored Neuro: Alert and oriented, No gross deficits Psych: Appropriate mood and affect, normal speech, normal thought and  judgment   Assessment/Plan:  DEPRESSION, MAJOR, MODERATE Clearly worsened in the last 3 months PMH Q.-9 score is 18 with a 0 and #9 Clearly I feel that some of this exacerbation is due to her current stressors, but I think some more aggressive medication management may help We've maximized Prozac with little benefit Will wean Prozac and start Effexor, continue Wellbutrin with good benefit in the past Discussed weaning with Dr. Raymondo BandKoval, decreased to 40 mg for 10 days then 20 mg for 10 days then stop.  Start 37.5 mg Effexor ER after 5 days of 20 mg dose For her sleep I discussed the possibility of trazodone, but I would like to avoid complicating the "neuroleptic soup"too much with all of her current changes. I feel her sleep will improve with improved depression control. I also feel that she will really benefit from some therapy, I referred her to Dr. Pascal LuxKane Return to clinic in 2 weeks  MIGRAINE, UNSPEC., W/O INTRACTABLE MIGRAINE Likely exacerbated by sleep deprivation and worsened depression Has tried amitriptyline, beta blockers in the past and has had good success with Topamax I'm hesitant to start Topamax and all these changes are taking place She may benefit from starting Effexor which were already doing for her mood If no improvement after 4 weeks of Effexor treatment I'll gladly start Topamax    Meds ordered this encounter  Medications  . FLUoxetine (PROZAC) 20 MG tablet    Sig: 40 mg daily for 10 days, Then 20 mg daily for 10 days.  Start effexor after 5 days of 20 mg dose    Dispense:  30 tablet    Refill:  0  . venlafaxine XR (EFFEXOR XR) 37.5 MG 24 hr capsule    Sig: Take 1 capsule (37.5 mg total) by mouth daily with breakfast.    Dispense:  30 capsule    Refill:  0

## 2013-04-23 NOTE — Telephone Encounter (Signed)
Hailey Hopkins called to schedule a therapy appointment.  First that matched up between our schedules was 05/06/13 at 9:00 a.m.  I told her the following: -  If she is unable to make the appointment she needs to call me. -  If she misses the appointment without a phone call, I won't be able to schedule her back in my clinic. She voiced an understanding and was able to repeat the appointment date and time back to me.

## 2013-04-23 NOTE — Assessment & Plan Note (Addendum)
Clearly worsened in the last 3 months PMH Q.-9 score is 18 with a 0 and #9 Clearly I feel that some of this exacerbation is due to her current stressors, but I think some more aggressive medication management may help We've maximized Prozac with little benefit Will wean Prozac and start Effexor, continue Wellbutrin with good benefit in the past Discussed weaning with Dr. Raymondo BandKoval, decreased to 40 mg for 10 days then 20 mg for 10 days then stop.  Start 37.5 mg Effexor ER after 5 days of 20 mg dose For her sleep I discussed the possibility of trazodone, but I would like to avoid complicating the "neuroleptic soup"too much with all of her current changes. I feel her sleep will improve with improved depression control. I also feel that she will really benefit from some therapy, I referred her to Dr. Pascal LuxKane Return to clinic in 2 weeks

## 2013-04-23 NOTE — Assessment & Plan Note (Signed)
Likely hypertensive now, no red flags Will likely start her on an medication on her next visit.

## 2013-05-06 ENCOUNTER — Encounter: Payer: Self-pay | Admitting: Psychology

## 2013-05-06 ENCOUNTER — Ambulatory Visit (INDEPENDENT_AMBULATORY_CARE_PROVIDER_SITE_OTHER): Payer: 59 | Admitting: Psychology

## 2013-05-06 DIAGNOSIS — F321 Major depressive disorder, single episode, moderate: Secondary | ICD-10-CM

## 2013-05-06 NOTE — Assessment & Plan Note (Signed)
Hailey Hopkins is neatly groomed and appropriately dressed.  Hailey Hopkins maintains good eye contact and is cooperative and attentive.  Speech is normal in tone, rate and rhythm.  Mood is reported as depressed.  Affect is wnl.  Hailey Hopkins reports Hailey Hopkins puts on a good face regularly in public situations. Thought process is logical and goal directed.  Denied suicidal or homicidal ideation.  Does not appear to be responding to any internal stimuli.  Able to maintain train of thought and concentrate on the questions.  Judgment and insight are average.  Hopeful that changes in pharmacotherapy will shift symptoms.  Ran out of time to focus on too many interventions today.  Surprisingly, her self-concept seems intact despite her difficult upbringing and report of symptoms consistent with depression.  Would expect a more negative cognitive process.  Wonder if anxious thoughts are more prominent.  Will explore next visit.  Sleep issues seem challenging in that they are long-standing and not responding to what sounds like some reasonable sleep hygiene strategies.  Hailey Hopkins reports Hailey Hopkins has tried the program where Hailey Hopkins tries to associate sleep with her bed.  Hailey Hopkins denies "crashing" despite a report that Hailey Hopkins is only getting about four hours per night  See patient instructions for further plan.  Will see back to further assess and develop a plan.  Moving her body more may be one of the most helpful things to help her combat mood symptoms.

## 2013-05-06 NOTE — Progress Notes (Signed)
Hailey Hopkins presented for an initial psychological assessment.  We reviewed the issue of confidentiality and the limits thereof; she voiced an understanding.    Presenting Problem: States she has some emotional issues she thinks she needs to resolve.  Dad has recently been put on hospice.  He stopped dialysis 10 days ago.  Hailey Hopkins is power of attorney.  Hailey Hopkins has been working part-time and going to school as well as being a single-mom.  She notes she has trust issues in relationships and thinks that now is the time to get some therapy.    Report of symptoms: Mood:  Stressed.  Tense.  Sad.  Frustrated.  When asked, states she seldom feels happy or a sense of peace / contentment.  She is a Geographical information systems officer"closet cryer." Sleep disturbance.  Goes to bed at 10:00 am and awakens at 1:00.  Often can't get back to sleep until 5:00 in the morning.  Has tried reading for school, hot shower and unisom.  Misses the sleep but does not nap.  Sounds like minimal caffeine use. Interest:  Notes that she plans an enjoys "date night" with her 39 year old son about every two weeks.  Acts cheerful at work. Guilt / worthlessness:  Denies.  Reports a positive view of herself.  Proud of her accomplishments.  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 Energy:  Poor.  Lacks motivation.   Concentration:  Did not assess Appetite:  Decreased but has gained weight; thinks this is because she is not motivated to move at all.  Sometimes forgets to eat.   Psychomotor:  WNL Suicide:  Denied  Duration of symptoms: Worsening symptoms over the past 3 months.  She has struggled with mood issues since early 3220s.  Impact on function: Funtional at work and at home.  Interpersonally distressed.  Likely impacts relationships.  Psychiatric History (include age of onset of first mood disturbance): - Diagnoses: Depression and anxiety.  - Treatment (include hospitalizations, pharmacotherapy and outpatient therapy): Pharmacotherapy by way of Zoloft (no  effect), wellbutrin and prozac.  Currently weaning off prozac and starting effexor 37.5.  Keeping wellbutrin 300 mg XL. Therapy in her early 20's which she cites as her first difficulty with mood.  No therapy since. Denied hospitalization.  Family history of psychiatric issues: Two full brothers.  Five half-brothers.  Substance abuse, PTSD, anger management present.  Father was verbally abusive and an alcoholic.  Several brothers have been to prison including one for manslaughter.    Current and history of substance use: Did not assess.    Medical conditions that might explain or contribute to symptoms: Reviewed problem list and medication list.  PHQ-9:  Did not get this visit.  04/23/13 score was 18. MDQ (if indicated):  Did not get.  Other:  Attending [employer].  Will graduate this May with two BS.  Feels proud of this accomplishment.  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  Dating a guy for about a year.  He has a four year old child and is in a custody battle with his ex-wife.

## 2013-05-06 NOTE — Patient Instructions (Signed)
Please schedule a follow-up on 05/20/13 at 9:00 am. We discussed scheduled worry time.  This may help you with your thoughts in the middle of the night. Learning some relaxation strategies may be helpful.  Consider Youtube and search for relaxation - visualization or progressive muscle relaxation.

## 2013-05-07 ENCOUNTER — Ambulatory Visit: Payer: 59 | Admitting: Family Medicine

## 2013-05-07 ENCOUNTER — Telehealth: Payer: Self-pay | Admitting: Family Medicine

## 2013-05-07 DIAGNOSIS — F321 Major depressive disorder, single episode, moderate: Secondary | ICD-10-CM

## 2013-05-07 MED ORDER — BUPROPION HCL ER (XL) 300 MG PO TB24
300.0000 mg | ORAL_TABLET | ORAL | Status: DC
Start: 2013-05-07 — End: 2013-12-02

## 2013-05-07 NOTE — Telephone Encounter (Signed)
Will fwd to pcp.  Illya Gienger L, CMA  

## 2013-05-07 NOTE — Telephone Encounter (Signed)
Called pt. Unable to leave message. Hailey Hopkins.Hailey Hopkins, Hailey Hopkins

## 2013-05-07 NOTE — Telephone Encounter (Signed)
Needs refill on [facility] is [facility]

## 2013-05-20 ENCOUNTER — Ambulatory Visit (INDEPENDENT_AMBULATORY_CARE_PROVIDER_SITE_OTHER): Payer: 59 | Admitting: Psychology

## 2013-05-20 DIAGNOSIS — F321 Major depressive disorder, single episode, moderate: Secondary | ICD-10-CM

## 2013-05-20 NOTE — Progress Notes (Signed)
Hailey Hopkins presents for follow-up.  She reports her Dad has moved to residential hospice and is suffering from delirium.  She has been debating about when to go - concerned about the impact on her job but has decided that she has to be there before her dad dies.  Is in contact with HR to look at a leave of absence (apparently works too few hours to be eligible for [employer]).    She reported she put a break on the romantic relationship she had.  He was under significant stress and she was concerned that the way that he was managing this might negatively impact her son.  She reports it was an easy decision for her.  Discussed her tendency to not think of herself or do anything for her self.  Tendency is to focus primarily on her son.  Views graduating college and even holding down a steady job is in service of her son.  Does remember stating she feels proud about her accomplishments.  Reviewed symptoms and specifically focused on cognitive symptoms.  Followed up on substance use.  Denies the use of any substances with the exception of infrequent alcohol intake in social settings.

## 2013-05-20 NOTE — Assessment & Plan Note (Addendum)
Report of mood remains sad.  Affect is consistent.  Thought process is not classically depressed.  Denies a negative view of self, world and future and this kind of negative thought process is not reflected in her language.  She does sound burdened.  She has been "fighting" for so long or doing so much for so long that she is worn out.  Wondered about depression with melancholic features but I don't think her symptoms fit.  She does possess a lot more of the vegetative symptoms of depression along with sadness and anhedonia.    She does not think her symptoms (or her father's declining health) will interfere with her graduating college.  She is believes she will get her work done.  She does note staying in bed until 2:00 or 3:00 in the afternoon on the weekends.  Discussed CBT approaches including continued use of relaxation, doing the next right thing (to attenuate feeling overwhelmed) and doing something for herself.  Specifically looked at moving her body some especially since she is going to her mother's house where the stress (drama) is likely to be high with siblings that live there.  Gave her a book on grief that might be helpful for her and her son to review.  Discussed the impact Jenasis's emotions have on her son.    See patient instructions for further plan.  She states she will call to schedule.

## 2013-05-20 NOTE — Patient Instructions (Signed)
Call me to schedule when you feel like it.  Also, please call to check in when you are back home.  I would like to hear how you are doing.  [phone]. Moving your body is something you can choose to do for yourself that will help your thoughts and feelings.  The cold weather is a barrier but not insurmountable. A thought strategy that we talked about today is asking yourself:  What's the next right thing?  This helps with feeling overwhelmed and paralyzed and gets you focused (hopefully) on very small steps you can take to get moving in a direction. I would continue to do the relaxation stuff in the middle of the night if you can't sleep. I gave you a book on grief.  It may or may not be a good match.  Let me know.

## 2013-05-28 ENCOUNTER — Ambulatory Visit (INDEPENDENT_AMBULATORY_CARE_PROVIDER_SITE_OTHER): Payer: 59 | Admitting: Family Medicine

## 2013-05-28 ENCOUNTER — Encounter: Payer: Self-pay | Admitting: Family Medicine

## 2013-05-28 VITALS — BP 156/109 | HR 74 | Temp 97.9°F | Ht 66.5 in | Wt 237.4 lb

## 2013-05-28 DIAGNOSIS — G43909 Migraine, unspecified, not intractable, without status migrainosus: Secondary | ICD-10-CM

## 2013-05-28 DIAGNOSIS — R102 Pelvic and perineal pain: Secondary | ICD-10-CM

## 2013-05-28 DIAGNOSIS — R300 Dysuria: Secondary | ICD-10-CM

## 2013-05-28 DIAGNOSIS — F321 Major depressive disorder, single episode, moderate: Secondary | ICD-10-CM

## 2013-05-28 DIAGNOSIS — R109 Unspecified abdominal pain: Secondary | ICD-10-CM

## 2013-05-28 DIAGNOSIS — I10 Essential (primary) hypertension: Secondary | ICD-10-CM

## 2013-05-28 LAB — POCT URINALYSIS DIPSTICK
Glucose, UA: NEGATIVE
Ketones, UA: NEGATIVE
Leukocytes, UA: NEGATIVE
Nitrite, UA: NEGATIVE
PROTEIN UA: 30
RBC UA: NEGATIVE
UROBILINOGEN UA: 0.2
pH, UA: 6

## 2013-05-28 LAB — POCT UA - MICROSCOPIC ONLY: Epithelial cells, urine per micros: >20

## 2013-05-28 MED ORDER — VENLAFAXINE HCL ER 75 MG PO CP24: 75.0000 mg | ORAL_CAPSULE | Freq: Every day | ORAL | Status: DC

## 2013-05-28 MED ORDER — HYDROCHLOROTHIAZIDE 12.5 MG PO CAPS: 12.5000 mg | ORAL_CAPSULE | Freq: Every day | ORAL | Status: AC

## 2013-05-28 NOTE — Assessment & Plan Note (Signed)
No HA since starting effexor Increased dose today for mood benefit

## 2013-05-28 NOTE — Assessment & Plan Note (Signed)
Slight improvement in PHQ-9 from 18 to 14 Increase effexor to 75 daily today HAs been seeing Dr. Pascal LuxKane Effexor also helping headaches.

## 2013-05-28 NOTE — Assessment & Plan Note (Signed)
Slightly elevated blood pressures in the last few months Discussed natural course of hypertension in medications, started HCTZ 12.5 daily

## 2013-05-28 NOTE — Patient Instructions (Signed)
It was great to see you today!  I have increased your effexor  I have started HCTZ

## 2013-05-28 NOTE — Assessment & Plan Note (Addendum)
Unclear etiology, previous hyst so possibly adhesion related pain UA today with 30 protein, micro has squams and clue cells Discussed possibility of BV and offered flagyl, she has no vaginal complaints and will call if it changes soon Abd exam benign, Will monitor.

## 2013-05-28 NOTE — Progress Notes (Signed)
Patient ID: Hailey ButteryFelicia G Polhemus, female   DOB: October 08, 1975, [age] y.o.   MRN: [id number]  Kevin FentonSamuel Bradshaw, MD Phone: [phone]  Subjective:  Chief complaint-noted  # Followup for depression  Depression Patient states she is tolerating the Effexor well, denies GI side effects. Still having a hard time with her current situation with her father in hospice. She denies change in mood all since starting medication  Dysuria States some vague dysuria for about one and a half weeks as well as suprapubic pain that is positional. She feels like she has severe pain when her bladder gets full. No fever, chills, sweats  Hypertension New diagnosis, patient slightly anxious but understanding of treatment and long-term effects Agreeable to start HCTZ today Denies headache, chest pain, dyspnea, palpitations, and lower extremity edema - ROS-  Past Medical History Patient Active Problem List   Diagnosis Date Noted  . Benign essential HTN 05/28/2013  . Suprapubic pressure 05/28/2013  . Skin tag 02/02/2013  . Rib pain 09/24/2012  . Cough 08/22/2012  . Hepatic steatosis 04/12/2011  . Back pain 06/16/2010  . HEMORRHOIDS, EXTERNAL 12/21/2009  . DEPRESSION, MAJOR, MODERATE 04/04/2007  . HAIR LOSS 08/23/2006  . OBESITY NOS 05/28/2006  . FATIGUE 05/28/2006  . ELEVATED BLOOD PRESSURE WITHOUT DIAGNOSIS OF HYPERTENSION 05/28/2006  . GLUCOSE INTOLERANCE 05/23/2006  . MIGRAINE, UNSPEC., W/O INTRACTABLE MIGRAINE 05/23/2006    Medications- reviewed and updated Current Outpatient Prescriptions  Medication Sig Dispense Refill  . acetaminophen (TYLENOL) 500 MG tablet Take 1,000 mg by mouth every 6 (six) hours as needed. For pain.      Marland Kitchen. buPROPion (WELLBUTRIN XL) 300 MG 24 hr tablet Take 1 tablet (300 mg total) by mouth every morning.  30 tablet  6  . hydrochlorothiazide (MICROZIDE) 12.5 MG capsule Take 1 capsule (12.5 mg total) by mouth daily.  30 capsule  5  . loratadine (CLARITIN) 10 MG tablet Take 1  tablet (10 mg total) by mouth daily.  30 tablet  1  . mupirocin ointment (BACTROBAN) 2 % Place 1 application into the nose 2 (two) times daily.  22 g  0  . pantoprazole (PROTONIX) 40 MG tablet Take 1 tablet (40 mg total) by mouth daily.  30 tablet  1  . propranolol (INDERAL) 10 MG tablet Take 10 mg by mouth 2 (two) times daily as needed. For migraines.      . venlafaxine XR (EFFEXOR XR) 75 MG 24 hr capsule Take 1 capsule (75 mg total) by mouth daily with breakfast.  30 capsule  0   No current facility-administered medications for this visit.    Objective: BP 156/109  Pulse 74  Temp(Src) 97.9 F (36.6 C) (Oral)  Ht 5' 6.5" (1.689 m)  Wt 237 lb 6.4 oz (107.684 kg)  BMI 37.75 kg/m2 Gen: NAD, alert, cooperative with exam HEENT: NCAT CV: RRR, good S1/S2, no murmur Resp: CTABL, no wheezes, non-labored Abd: SNTND, BS present, no guarding or organomegaly Ext: No edema, warm Neuro: Alert and oriented, No gross deficits Psych: PHQ-9 score 14 and somewhat difficult, no SI   Assessment/Plan:  Benign essential HTN Slightly elevated blood pressures in the last few months Discussed natural course of hypertension in medications, started HCTZ 12.5 daily  DEPRESSION, MAJOR, MODERATE Slight improvement in PHQ-9 from 18 to 14 Increase effexor to 75 daily today HAs been seeing Dr. Pascal LuxKane Effexor also helping headaches.   MIGRAINE, UNSPEC., W/O INTRACTABLE MIGRAINE No HA since starting effexor Increased dose today for mood benefit  Suprapubic pressure Unclear  etiology, previous hyst so possibly adhesion related pain UA today with 30 protein, micro has squams and clue cells Discussed possibility of BV and offered flagyl, she has no vaginal complaints and will call if it changes soon Abd exam benign, Will monitor.    Orders Placed This Encounter  Procedures  . POCT urinalysis dipstick  . POCT UA - Microscopic Only    Meds ordered this encounter  Medications  . venlafaxine XR (EFFEXOR  XR) 75 MG 24 hr capsule    Sig: Take 1 capsule (75 mg total) by mouth daily with breakfast.    Dispense:  30 capsule    Refill:  0  . hydrochlorothiazide (MICROZIDE) 12.5 MG capsule    Sig: Take 1 capsule (12.5 mg total) by mouth daily.    Dispense:  30 capsule    Refill:  5

## 2013-07-29 ENCOUNTER — Other Ambulatory Visit: Payer: Self-pay | Admitting: Family Medicine

## 2013-08-05 ENCOUNTER — Encounter: Payer: Self-pay | Admitting: Family Medicine

## 2013-08-05 ENCOUNTER — Ambulatory Visit (INDEPENDENT_AMBULATORY_CARE_PROVIDER_SITE_OTHER): Payer: 59 | Admitting: Family Medicine

## 2013-08-05 VITALS — BP 125/81 | HR 84 | Temp 98.5°F | Ht 66.5 in | Wt 242.0 lb

## 2013-08-05 DIAGNOSIS — K644 Residual hemorrhoidal skin tags: Secondary | ICD-10-CM

## 2013-08-05 MED ORDER — PHENYLEPHRINE-COCOA BUTTER 0.25-88.44 % RE SUPP: 1.0000 | Freq: Two times a day (BID) | RECTAL | Status: DC

## 2013-08-05 NOTE — Progress Notes (Signed)
Family Medicine Office Visit Note   Subjective:   Patient ID: Hailey Hopkins, female  DOB: [birth year], 38 y.o.. MRN: [id number]   Pt that comes today for same day appointment complaining of bright red rectal bleeding for about 1 week. Bleeding is more noticeable when she wipes, otherwise is undetectable. She reports her stools are soft and she has been using preparation H cream with no complete resolution of her symptoms. Pain is mild and denies pruritus or other symptoms on her perianal area. Denies abdominal pain, fever, nausea, or other systemic symptoms.   Review of Systems:  Per HPI  Objective:   Physical Exam: Gen:  NAD HEENT: Moist mucous membranes  CV: Regular rate and rhythm, no murmurs rubs or gallops PULM: Clear to auscultation bilaterally. No wheezes/rales/rhonchi ABD: Soft, non tender, non distended, normal bowel sounds Perianal area without thrombosed hemorrhoids. No erythema, no active bleeding present. Anoscopy reveals present of hemorrhoids but no source of active bleeding. No fissures or other lesions seen. EXT: No edema Neuro: Alert and oriented x3. No focalization  Assessment & Plan:

## 2013-08-05 NOTE — Assessment & Plan Note (Addendum)
No active bleeding or thrombose hemorrhoids. Pt already has soft BM. Recommend Preparation H suppository w/ phenylephrine and Epson Sitz Baths. F/u with PCP if no improvement or worsening of her present symptoms.

## 2013-08-05 NOTE — Patient Instructions (Signed)
It has been a pleasure to see you today. Please take the medications as prescribed. Epson salt sitz baths for 20 min 3 times a day. Preparation H suppository twice a day.

## 2013-10-08 ENCOUNTER — Ambulatory Visit (INDEPENDENT_AMBULATORY_CARE_PROVIDER_SITE_OTHER): Payer: 59 | Admitting: Family Medicine

## 2013-10-08 ENCOUNTER — Encounter: Payer: Self-pay | Admitting: Family Medicine

## 2013-10-08 VITALS — BP 132/93 | HR 92 | Temp 97.9°F | Wt 245.0 lb

## 2013-10-08 DIAGNOSIS — E669 Obesity, unspecified: Secondary | ICD-10-CM

## 2013-10-08 DIAGNOSIS — F321 Major depressive disorder, single episode, moderate: Secondary | ICD-10-CM

## 2013-10-08 DIAGNOSIS — G43909 Migraine, unspecified, not intractable, without status migrainosus: Secondary | ICD-10-CM

## 2013-10-08 LAB — LIPID PANEL
CHOL/HDL RATIO: 3.9 ratio
Cholesterol: 193 mg/dL (ref 0–200)
HDL: 50 mg/dL (ref >39)
LDL Cholesterol: 106 mg/dL — ABNORMAL HIGH (ref 0–99)
Triglycerides: 184 mg/dL — ABNORMAL HIGH (ref <150)
VLDL: 37 mg/dL (ref 0–40)

## 2013-10-08 LAB — CBC WITH DIFFERENTIAL/PLATELET
BASOS ABS: 0.1 10*3/uL (ref 0.0–0.1)
Basophils Relative: 1 % (ref 0–1)
EOS ABS: 0.1 10*3/uL (ref 0.0–0.7)
Eosinophils Relative: 1 % (ref 0–5)
HCT: 39.9 % (ref 36.0–46.0)
Hemoglobin: 14.3 g/dL (ref 12.0–15.0)
LYMPHS PCT: 32 % (ref 12–46)
Lymphs Abs: 2 10*3/uL (ref 0.7–4.0)
MCH: 31.6 pg (ref 26.0–34.0)
MCHC: 35.8 g/dL (ref 30.0–36.0)
MCV: 88.1 fL (ref 78.0–100.0)
Monocytes Absolute: 0.5 10*3/uL (ref 0.1–1.0)
Monocytes Relative: 9 % (ref 3–12)
NEUTROS PCT: 57 % (ref 43–77)
Neutro Abs: 3.5 10*3/uL (ref 1.7–7.7)
PLATELETS: 276 10*3/uL (ref 150–400)
RBC: 4.53 MIL/uL (ref 3.87–5.11)
RDW: 13 % (ref 11.5–15.5)
WBC: 6.1 10*3/uL (ref 4.0–10.5)

## 2013-10-08 LAB — COMPREHENSIVE METABOLIC PANEL
ALT: 24 U/L (ref 0–35)
AST: 22 U/L (ref 0–37)
Albumin: 3.9 g/dL (ref 3.5–5.2)
Alkaline Phosphatase: 59 U/L (ref 39–117)
BILIRUBIN TOTAL: 0.6 mg/dL (ref 0.2–1.2)
BUN: 11 mg/dL (ref 6–23)
CHLORIDE: 105 meq/L (ref 96–112)
CO2: 24 mEq/L (ref 19–32)
CREATININE: 0.8 mg/dL (ref 0.50–1.10)
Calcium: 9.2 mg/dL (ref 8.4–10.5)
Glucose, Bld: 101 mg/dL — ABNORMAL HIGH (ref 70–99)
Potassium: 3.8 mEq/L (ref 3.5–5.3)
Sodium: 137 mEq/L (ref 135–145)
Total Protein: 6.9 g/dL (ref 6.0–8.3)

## 2013-10-08 LAB — TSH: TSH: 1.709 u[IU]/mL (ref 0.350–4.500)

## 2013-10-08 MED ORDER — TOPIRAMATE 50 MG PO TABS: ORAL_TABLET | ORAL | Status: DC

## 2013-10-08 MED ORDER — VENLAFAXINE HCL ER 37.5 MG PO CP24: 37.5000 mg | ORAL_CAPSULE | Freq: Every day | ORAL | Status: DC

## 2013-10-08 NOTE — Assessment & Plan Note (Signed)
PHQ-9 score 2 Well controlled on wellbutrin and effexor Feels life changes, death of father and finishing school, may help her mood Previously on prozac, we will stop effexor and hold off on SSRI unless she develops symptoms.

## 2013-10-08 NOTE — Assessment & Plan Note (Signed)
Well controlled with effexor but not tolerating due to weight gain DC effexor and start topamax ramp up to 100 mg daily F/u 1 month

## 2013-10-08 NOTE — Progress Notes (Signed)
Patient ID: Hailey Hopkins, female   DOB: 10-01-1975, 38 y.o.   MRN: [id number]  Hailey FentonSamuel Lyndsi Altic, MD Phone: [phone]  Subjective:  Chief complaint-noted  Pt Here for f/u depression and weight gain  Weight gain Has made major lifestyle changes in the last month Watching her diet closely, decreased fried/fatty foods and quit soda Walking/joging twoce a day Gaining 6 lbs despite all of this  Energy is good, no hair or nail changes   Mood Feels mood is not affected by effexor No SI Taking meds everyday and tolerating well Enjoys therapy  Migraines Well controled with effexor Would like to start topamax if stops effexor Previously needed 200 mg topamax, was on for 5 years  Non smoker S/p hysterectomy Hx of diverticulosis- asymptomatic currently  ROS-  Per HPI  Past Medical History Patient Active Problem List   Diagnosis Date Noted  . Benign essential HTN 05/28/2013  . Cough 08/22/2012  . Hepatic steatosis 04/12/2011  . Back pain 06/16/2010  . HEMORRHOIDS, EXTERNAL 12/21/2009  . DEPRESSION, MAJOR, MODERATE 04/04/2007  . HAIR LOSS 08/23/2006  . OBESITY NOS 05/28/2006  . FATIGUE 05/28/2006  . ELEVATED BLOOD PRESSURE WITHOUT DIAGNOSIS OF HYPERTENSION 05/28/2006  . GLUCOSE INTOLERANCE 05/23/2006  . MIGRAINE, UNSPEC., W/O INTRACTABLE MIGRAINE 05/23/2006    Medications- reviewed and updated Current Outpatient Prescriptions  Medication Sig Dispense Refill  . acetaminophen (TYLENOL) 500 MG tablet Take 1,000 mg by mouth every 6 (six) hours as needed. For pain.      Marland Kitchen. buPROPion (WELLBUTRIN XL) 300 MG 24 hr tablet Take 1 tablet (300 mg total) by mouth every morning.  30 tablet  6  . hydrochlorothiazide (MICROZIDE) 12.5 MG capsule Take 1 capsule (12.5 mg total) by mouth daily.  30 capsule  5  . loratadine (CLARITIN) 10 MG tablet Take 1 tablet (10 mg total) by mouth daily.  30 tablet  1  . mupirocin ointment (BACTROBAN) 2 % Place 1 application into the nose 2 (two)  times daily.  22 g  0  . pantoprazole (PROTONIX) 40 MG tablet Take 1 tablet (40 mg total) by mouth daily.  30 tablet  1  . Phenylephrine-Cocoa Butter (PREPARATION H) 0.25-88.44 % SUPP Place 1 suppository rectally 2 (two) times daily.  20 suppository  2  . topiramate (TOPAMAX) 50 MG tablet Take 1/2 pill X 1 week, then 1 pill daily X 1 week, then 2 pills daily  42 tablet  0  . venlafaxine XR (EFFEXOR XR) 37.5 MG 24 hr capsule Take 1 capsule (37.5 mg total) by mouth daily with breakfast.  14 capsule  0   No current facility-administered medications for this visit.    Objective: BP 132/93  Pulse 92  Temp(Src) 97.9 F (36.6 C) (Oral)  Wt 245 lb (111.131 kg) Gen: NAD, alert, cooperative with exam HEENT: NCAT CV: RRR, good S1/S2, no murmur Resp: CTABL, no wheezes, non-labored Abd: BS present Ext: No edema, warm Neuro: Alert and oriented, No gross deficits   Assessment/Plan:  OBESITY NOS With recent weight gain despite aggressive lifestyle changes Suspect Effexor as a contributor but this would be uncommon (<1 %) DC effexor TSH F/u 4 weeks  MIGRAINE, UNSPEC., W/O INTRACTABLE MIGRAINE Well controlled with effexor but not tolerating due to weight gain DC effexor and start topamax ramp up to 100 mg daily F/u 1 month  DEPRESSION, MAJOR, MODERATE PHQ-9 score 2 Well controlled on wellbutrin and effexor Feels life changes, death of father and finishing school, may help her mood Previously  on prozac, we will stop effexor and hold off on SSRI unless she develops symptoms.    Orders Placed This Encounter  Procedures  . TSH  . Lipid Panel  . CBC with Differential  . Comprehensive metabolic panel    Meds ordered this encounter  Medications  . venlafaxine XR (EFFEXOR XR) 37.5 MG 24 hr capsule    Sig: Take 1 capsule (37.5 mg total) by mouth daily with breakfast.    Dispense:  14 capsule    Refill:  0  . topiramate (TOPAMAX) 50 MG tablet    Sig: Take 1/2 pill X 1 week, then 1  pill daily X 1 week, then 2 pills daily    Dispense:  42 tablet    Refill:  0

## 2013-10-08 NOTE — Assessment & Plan Note (Addendum)
With recent weight gain despite aggressive lifestyle changes, 7 lbs in 4 months Suspect Effexor as a contributor but this would be uncommon (<1 %) DC effexor TSH F/u 4 weeks

## 2013-10-08 NOTE — Patient Instructions (Signed)
Great to see you !  Stop the effexor with 1 (37.5 mg) pill daily X 1 week the 1 every other day for 2 weeks  Start topamax as we described and as its on the bottle  Follow up 1 month.

## 2013-10-09 ENCOUNTER — Encounter: Payer: Self-pay | Admitting: Family Medicine

## 2013-11-12 ENCOUNTER — Ambulatory Visit: Payer: 59 | Admitting: Family Medicine

## 2013-12-02 ENCOUNTER — Ambulatory Visit (INDEPENDENT_AMBULATORY_CARE_PROVIDER_SITE_OTHER): Payer: 59 | Admitting: Family Medicine

## 2013-12-02 ENCOUNTER — Encounter: Payer: Self-pay | Admitting: Family Medicine

## 2013-12-02 VITALS — BP 128/80 | HR 79 | Temp 97.8°F | Wt 233.0 lb

## 2013-12-02 DIAGNOSIS — G43909 Migraine, unspecified, not intractable, without status migrainosus: Secondary | ICD-10-CM

## 2013-12-02 DIAGNOSIS — M722 Plantar fascial fibromatosis: Secondary | ICD-10-CM

## 2013-12-02 DIAGNOSIS — F321 Major depressive disorder, single episode, moderate: Secondary | ICD-10-CM

## 2013-12-02 MED ORDER — TOPIRAMATE 100 MG PO TABS: 100.0000 mg | ORAL_TABLET | Freq: Every day | ORAL | Status: DC

## 2013-12-02 MED ORDER — BUPROPION HCL ER (XL) 300 MG PO TB24: 300.0000 mg | ORAL_TABLET | ORAL | Status: AC

## 2013-12-02 NOTE — Patient Instructions (Signed)
Great to see you!  Continue the topamax and come abck in 3-4 months.   Sports medicine will call you, in the meantime continue ICE, NSAIDs, and stretching.   Plantar Fasciitis Plantar fasciitis is a common condition that causes foot pain. It is soreness (inflammation) of the band of tough fibrous tissue on the bottom of the foot that runs from the heel bone (calcaneus) to the ball of the foot. The cause of this soreness may be from excessive standing, poor fitting shoes, running on hard surfaces, being overweight, having an abnormal walk, or overuse (this is common in runners) of the painful foot or feet. It is also common in aerobic exercise dancers and ballet dancers. SYMPTOMS  Most people with plantar fasciitis complain of:  Severe pain in the morning on the bottom of their foot especially when taking the first steps out of bed. This pain recedes after a few minutes of walking.  Severe pain is experienced also during walking following a long period of inactivity.  Pain is worse when walking barefoot or up stairs DIAGNOSIS   Your caregiver will diagnose this condition by examining and feeling your foot.  Special tests such as X-rays of your foot, are usually not needed. PREVENTION   Consult a sports medicine professional before beginning a new exercise program.  Walking programs offer a good workout. With walking there is a lower chance of overuse injuries common to runners. There is less impact and less jarring of the joints.  Begin all new exercise programs slowly. If problems or pain develop, decrease the amount of time or distance until you are at a comfortable level.  Wear good shoes and replace them regularly.  Stretch your foot and the heel cords at the back of the ankle (Achilles tendon) both before and after exercise.  Run or exercise on even surfaces that are not hard. For example, asphalt is better than pavement.  Do not run barefoot on hard surfaces.  If using a  treadmill, vary the incline.  Do not continue to workout if you have foot or joint problems. Seek professional help if they do not improve. HOME CARE INSTRUCTIONS   Avoid activities that cause you pain until you recover.  Use ice or cold packs on the problem or painful areas after working out.  Only take over-the-counter or prescription medicines for pain, discomfort, or fever as directed by your caregiver.  Soft shoe inserts or athletic shoes with air or gel sole cushions may be helpful.  If problems continue or become more severe, consult a sports medicine caregiver or your own health care provider. Cortisone is a potent anti-inflammatory medication that may be injected into the painful area. You can discuss this treatment with your caregiver. MAKE SURE YOU:   Understand these instructions.  Will watch your condition.  Will get help right away if you are not doing well or get worse. Document Released: 12/05/2000 Document Revised: 06/04/2011 Document Reviewed: 02/04/2008 [facility] Patient Information 2015 [place], [place]. This information is not intended to replace advice given to you by your health care provider. Make sure you discuss any questions you have with your health care provider.

## 2013-12-02 NOTE — Assessment & Plan Note (Signed)
Patient not improving with NSAIDs, stretches, ice She would consider steroid injection Will refer to sports medicine for [facility] ultrasound and possible injection if they feel it is warranted

## 2013-12-02 NOTE — Assessment & Plan Note (Signed)
Doing well in the 100 mg Topamax Reports no migraines at all We'll continue 100 mg daily and trial 50 mg in 3-6 months.

## 2013-12-02 NOTE — Progress Notes (Signed)
Patient ID: Hailey Hopkins, female   DOB: 1975/09/04, 38 y.o.   MRN: [id number]  Kevin Fenton, MD Phone: [phone]  Subjective:  Chief complaint-noted  Pt Here for followup headaches and right foot pain  Headaches States that her headaches have completely gone away since titrating him Topamax. She's on 100 mg a day Denies any side effects of the Topamax,  she was previously on 200 mg a day and had decreased mental accuity/sharpness at that dose  R foot pain Has had right foot pain now for the last 2 months. Describes that it's at the inferior portion of her heel pad and were set into the lungs show. 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 She's tried 100 mg Motrin, ice, and stretching with minimal relief. She's open to steroid injection  ROS-  Per HPI  Past Medical History Patient Active Problem List   Diagnosis Date Noted  . Plantar fasciitis 12/02/2013  . Benign essential HTN 05/28/2013  . Cough 08/22/2012  . Hepatic steatosis 04/12/2011  . Back pain 06/16/2010  . HEMORRHOIDS, EXTERNAL 12/21/2009  . DEPRESSION, MAJOR, MODERATE 04/04/2007  . HAIR LOSS 08/23/2006  . OBESITY NOS 05/28/2006  . FATIGUE 05/28/2006  . ELEVATED BLOOD PRESSURE WITHOUT DIAGNOSIS OF HYPERTENSION 05/28/2006  . GLUCOSE INTOLERANCE 05/23/2006  . MIGRAINE, UNSPEC., W/O INTRACTABLE MIGRAINE 05/23/2006    Medications- reviewed and updated Current Outpatient Prescriptions  Medication Sig Dispense Refill  . acetaminophen (TYLENOL) 500 MG tablet Take 1,000 mg by mouth every 6 (six) hours as needed. For pain.      Marland Kitchen buPROPion (WELLBUTRIN XL) 300 MG 24 hr tablet Take 1 tablet (300 mg total) by mouth every morning.  30 tablet  6  . hydrochlorothiazide (MICROZIDE) 12.5 MG capsule Take 1 capsule (12.5 mg total) by mouth daily.  30 capsule  5  . loratadine (CLARITIN) 10 MG tablet Take 1 tablet (10 mg total) by mouth  daily.  30 tablet  1  . topiramate (TOPAMAX) 100 MG tablet Take 1 tablet (100 mg total) by mouth daily. Take 1/2 pill X 1 week, then 1 pill daily X 1 week, then 2 pills daily  30 tablet  5   No current facility-administered medications for this visit.    Objective: BP 128/80  Pulse 79  Temp(Src) 97.8 F (36.6 C) (Oral)  Wt 233 lb (105.688 kg) Gen: NAD, alert, cooperative with exam HEENT: NCAT Ext: No edema, warm Neuro: Alert and oriented, No gross deficits MSK: Point tenderness at the plantar fascial insertion, no erythema or gross deformity of the right foot   Assessment/Plan:  MIGRAINE, UNSPEC., W/O INTRACTABLE MIGRAINE Doing well in the 100 mg Topamax Reports no migraines at all We'll continue 100 mg daily and trial 50 mg in 3-6 months.  Plantar fasciitis Patient not improving with NSAIDs, stretches, ice She would consider steroid injection Will refer to sports medicine for MSK ultrasound and possible injection if they feel it is warranted   Orders Placed This Encounter  Procedures  . Ambulatory referral to Sports Medicine    Referral Priority:  Routine    Referral Type:  Consultation    Number of Visits Requested:  1    Meds ordered this encounter  Medications  . topiramate (TOPAMAX) 100 MG tablet    Sig: Take 1 tablet (100 mg total) by mouth daily. Take 1/2 pill X 1 week, then 1 pill daily X  1 week, then 2 pills daily    Dispense:  30 tablet    Refill:  5

## 2013-12-03 MED ORDER — TOPIRAMATE 100 MG PO TABS: 100.0000 mg | ORAL_TABLET | Freq: Every day | ORAL | Status: AC

## 2013-12-03 NOTE — Addendum Note (Signed)
Addended by: Elenora Gamma on: 12/03/2013 04:53 PM   Modules accepted: Orders

## 2013-12-11 ENCOUNTER — Ambulatory Visit (INDEPENDENT_AMBULATORY_CARE_PROVIDER_SITE_OTHER): Payer: 59 | Admitting: Family Medicine

## 2013-12-11 ENCOUNTER — Encounter: Payer: Self-pay | Admitting: Family Medicine

## 2013-12-11 VITALS — BP 122/89 | HR 74 | Ht 66.0 in | Wt 230.0 lb

## 2013-12-11 DIAGNOSIS — M722 Plantar fascial fibromatosis: Secondary | ICD-10-CM

## 2013-12-11 NOTE — Assessment & Plan Note (Signed)
-  At this point our goal is to alter the biomechanics prior to attempting any sort of injection. -She will check with her insurance as to the cost of custom orthotics, but in the meantime we placed a scaphoid pad in the bilateral insoles of her shoes with good relief of symptoms. -She is to continue anti-inflammatories, frozen water bottle rolls, and plantar fascia stretches. -She will followup for custom orthotics and/or consideration of an injection if her symptoms persist.

## 2013-12-11 NOTE — Progress Notes (Signed)
   Subjective:    Patient ID: Hailey Hopkins, female    DOB: 12/31/1975, 38 y.o.   MRN: [id number]  HPI Hailey Hopkins is a 38 year old female who presents with bilateral foot pain. Onset of symptoms was approximately 2 months ago without any known acute injury. Location of pain is primarily the plantar surface of her foot just distal to her heel. She says that the right foot is worse on the left. 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 She denies any temporal factors. She has tried ibuprofen and Aleve. She has tried over the counter orthotics without much improvement. She has tried frozen water bottle ice rolling and plantar fascia stretches with little relief. She denies any recent changes in weight, numbness, tingling, swelling, bruising, feeling a pop in her foot, or weakness. She has not very active otherwise.  Past medical, social, medications, and allergies were reviewed and are up-to-date in the chart. Review of Systems As per history of present illness, otherwise a point review of systems was performed is otherwise negative.    Objective:   Physical Exam BP 122/89  Pulse 74  Ht  (1.676 m)  Wt 230 lb (104.327 kg)  BMI 37.14 kg/m2 GEN: The patient is well-developed well-nourished female and in no acute distress.  She is awake alert and oriented x3. SKIN: warm and well-perfused, no rash  EXTR: No lower extremity edema or calf tenderness Neuro: Strength 5/5 globally. Sensation intact throughout. No focal deficits. Vasc: +2 bilateral distal pulses. No edema.  MSK: Examination of the bilateral feet reveals tenderness to palpation in an area approximately 2 cm distal to the calcaneus bilaterally, though right greater than left. This is located along the plantar fascia. Negative calcaneal squeeze test. No tenderness at the Achilles tendon or its insertion. Negative metatarsal squeeze test. Gait analysis reveals  significant flattening of the medial longitudinal arch bilaterally, though right greater than left. She hyperpronates on the right foot. There is no leg length discrepancy.  Limited musculoskeletal ultrasound: Views were obtained of the bilateral plantar surface of the feet in the long and short axis. The right plantar fascia has some echogenic changes proximally 2-3 cm distal to the calcaneus over the point of maximal tenderness, though otherwise appears intact. The left plantar fascia appears normal.     Assessment & Plan:  Please see problem based assessment and plan in the problem list.

## 2014-03-03 ENCOUNTER — Ambulatory Visit (INDEPENDENT_AMBULATORY_CARE_PROVIDER_SITE_OTHER): Payer: 59 | Admitting: Family Medicine

## 2014-03-03 ENCOUNTER — Encounter: Payer: Self-pay | Admitting: Family Medicine

## 2014-03-03 VITALS — BP 167/109 | HR 87 | Temp 97.8°F | Ht 66.0 in | Wt 228.2 lb

## 2014-03-03 DIAGNOSIS — I10 Essential (primary) hypertension: Secondary | ICD-10-CM

## 2014-03-03 DIAGNOSIS — R059 Cough, unspecified: Secondary | ICD-10-CM

## 2014-03-03 DIAGNOSIS — R05 Cough: Secondary | ICD-10-CM

## 2014-03-03 DIAGNOSIS — G43109 Migraine with aura, not intractable, without status migrainosus: Secondary | ICD-10-CM

## 2014-03-03 MED ORDER — NAPROXEN 500 MG PO TABS: 500.0000 mg | ORAL_TABLET | Freq: Two times a day (BID) | ORAL | Status: DC

## 2014-03-03 NOTE — Progress Notes (Signed)
Patient ID: Hailey ButteryFelicia G Mccuiston, female   DOB: Sep 14, 1975, 38 y.o.   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 She does not have any abortive therapy and has not tried any meds.  She states that she is tolerating topamax easily Previously doing very well with topamax No periods 2/2 hyst  Smoking status noted ROS: Per HPI  Objective: BP 167/109 mmHg  Pulse 87  Temp(Src) 97.8 F (36.6 C) (Oral)  Ht 5\' 6"  (1.676 m)  Wt 228 lb 3.2 oz (103.511 kg)  BMI 36.85 kg/m2 Gen: NAD, alert, cooperative with exam HEENT: NCAT CV: RRR, good S1/S2, no murmur Resp: CTABL, no wheezes, non-labored Ext: No edema, warm Neuro: Alert and oriented, No gross deficits  Assessment and plan:  Migraine Well-controlled until about a month ago, has had 2 severe headaches in the last month Add naproxen for abortive therapy Continue Topamax at current dose Consider adding beta blocker with worsening hypertension Considerheadache clinic referral Follow-up one month  Benign essential HTN Elevated today After discussion with the patient she like to continue HCTZ at current dose and check her blood pressure a few times at work 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 naproxen (NAPROSYN) 500 MG tablet    Sig: Take 1 tablet (500 mg total) by mouth 2 (two) times daily with a meal.    Dispense:  20 tablet    Refill:  2

## 2014-03-03 NOTE — Patient Instructions (Signed)
Great to see you!  Lets use the naproxen at the first sign of a migraine  Keep track of 5-10 BPs this month for me  Follow up 1 month and we will decide what to do next on your bp and headaches.

## 2014-03-03 NOTE — Assessment & Plan Note (Signed)
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

## 2014-03-03 NOTE — Assessment & Plan Note (Signed)
Well-controlled until about a month ago, has had 2 severe headaches in the last month Add naproxen for abortive therapy Continue Topamax at current dose Consider adding beta blocker with worsening hypertension Considerheadache clinic referral Follow-up one month

## 2014-03-24 ENCOUNTER — Telehealth: Payer: Self-pay | Admitting: *Deleted

## 2014-03-24 NOTE — Telephone Encounter (Signed)
LVM for pt to return call to see about scheduling a nurse visit for a flu shot.Hailey Hopkins, Hailey Hopkins  

## 2014-04-12 ENCOUNTER — Telehealth: Payer: Self-pay | Admitting: Family Medicine

## 2014-04-12 NOTE — Telephone Encounter (Signed)
Received and filled out FMLA forms. Given 1-2 episodes per month lasting 1 day for migraine Headaches. Needing at least 2 visits per year.   Murtis SinkSam Nichole Keltner, MD [facility] Resident, PGY-3 04/12/2014, 11:47 AM

## 2014-04-14 ENCOUNTER — Telehealth: Payer: Self-pay | Admitting: Family Medicine

## 2014-04-14 NOTE — Telephone Encounter (Signed)
Pt called and would like Dr. Ermalinda MemosBradshaw to call her concerning her FMLA papers . Please call her at [phone]. jw

## 2014-04-15 NOTE — Telephone Encounter (Signed)
Called, no answer. Left VM asking for her to leave more details with nursing.   Murtis SinkSam Bradshaw, MD [facility] Resident, PGY-3 04/15/2014, 11:07 AM

## 2014-05-17 ENCOUNTER — Ambulatory Visit (HOSPITAL_COMMUNITY)
Admission: RE | Admit: 2014-05-17 | Discharge: 2014-05-17 | Disposition: A | Discharge status: Home / Self Care | Payer: PRIVATE HEALTH INSURANCE | Source: Ambulatory Visit | Attending: Family Medicine | Admitting: Family Medicine

## 2014-05-17 ENCOUNTER — Other Ambulatory Visit (HOSPITAL_COMMUNITY): Payer: Self-pay | Admitting: Family Medicine

## 2014-05-17 DIAGNOSIS — M25512 Pain in left shoulder: Secondary | ICD-10-CM

## 2014-05-17 DIAGNOSIS — X58XXXA Exposure to other specified factors, initial encounter: Secondary | ICD-10-CM | POA: Diagnosis not present

## 2014-07-14 ENCOUNTER — Other Ambulatory Visit (HOSPITAL_COMMUNITY): Payer: Self-pay | Admitting: Orthopedic Surgery

## 2014-07-14 DIAGNOSIS — M25512 Pain in left shoulder: Secondary | ICD-10-CM

## 2014-07-22 ENCOUNTER — Ambulatory Visit: Payer: PRIVATE HEALTH INSURANCE | Attending: Orthopedic Surgery

## 2014-07-22 DIAGNOSIS — M25511 Pain in right shoulder: Secondary | ICD-10-CM

## 2014-07-22 DIAGNOSIS — R29898 Other symptoms and signs involving the musculoskeletal system: Secondary | ICD-10-CM | POA: Diagnosis not present

## 2014-07-22 DIAGNOSIS — S46912D Strain of unspecified muscle, fascia and tendon at shoulder and upper arm level, left arm, subsequent encounter: Secondary | ICD-10-CM | POA: Diagnosis not present

## 2014-07-22 DIAGNOSIS — M6281 Muscle weakness (generalized): Secondary | ICD-10-CM

## 2014-07-22 DIAGNOSIS — X58XXXD Exposure to other specified factors, subsequent encounter: Secondary | ICD-10-CM | POA: Insufficient documentation

## 2014-07-22 DIAGNOSIS — M25611 Stiffness of right shoulder, not elsewhere classified: Secondary | ICD-10-CM

## 2014-07-22 DIAGNOSIS — R293 Abnormal posture: Secondary | ICD-10-CM | POA: Diagnosis not present

## 2014-07-22 DIAGNOSIS — I10 Essential (primary) hypertension: Secondary | ICD-10-CM | POA: Insufficient documentation

## 2014-07-22 NOTE — Patient Instructions (Signed)
Use pillow support Lt arm when sitting. Issued HEP with reaching into corner up wall for stretch 3-5 sec and 2 x/day 2 reps/ Also stretching  Neck to RT 30 sec x2 2x/day.  Also to remove tape if irritating skin and remove immediately. If OK she can wear 3 days and shower /get tape wet.

## 2014-07-22 NOTE — Therapy (Signed)
[place], [place], [address] Phone: [phone]   Fax:  [phone]  Physical Therapy Evaluation  Patient Details  Name: Hailey Hopkins MRN: [id number] Date of Birth: 04-Jun-1975 Referring Provider:  Tania Ade, MD  Encounter Date: 07/22/2014      PT End of Session - 07/22/14 1243    Visit Number 1   Number of Visits 12   Date for PT Re-Evaluation 09/02/14   PT Start Time 4034   PT Stop Time 1240   PT Time Calculation (min) 55 min   Activity Tolerance Patient tolerated treatment well   Behavior During Therapy [facility] for tasks assessed/performed      Past Medical History  Diagnosis Date  . Diverticular disease   . Depression     Past Surgical History  Procedure Laterality Date  . Abdominal hysterectomy      There were no vitals filed for this visit.  Visit Diagnosis:  Stiffness of right shoulder joint - Plan: PT plan of care cert/re-cert  Muscle weakness of right arm - Plan: PT plan of care cert/re-cert  Pain in right shoulder - Plan: PT plan of care cert/re-cert  Abnormal posture - Plan: PT plan of care cert/re-cert      Subjective Assessment - 07/22/14 1204    Pain Orientation Left   Pain Descriptors / Indicators Constant;Aching;Tightness   Pain Type Chronic pain   Pain Onset More than a month ago   Pain Frequency Constant   Aggravating Factors  See above   Pain Relieving Factors Medication, cold and heat   Multiple Pain Sites No            [facility] PT Assessment - 07/22/14 1206    Assessment   Medical Diagnosis LT shoulder pain /strain with possible SLAP injry   Onset Date 05/14/14   Next MD Visit 08/06/14   Prior Therapy No   Precautions   Precaution Comments reach over head , hold over 2 pounds   Restrictions   Weight Bearing Restrictions No   Balance Screen   Has the patient fallen in the past 6 months No   Prior Function   Level of Independence Independent  with basic ADLs   Cognition   Overall Cognitive Status Within Functional Limits for tasks assessed   Observation/Other Assessments   Focus on Therapeutic Outcomes (FOTO)  61%   Posture/Postural Control   Posture Comments Rounded shoulders   ROM / Strength   AROM / PROM / Strength AROM;PROM;Strength   AROM   AROM Assessment Site Shoulder   Right/Left Shoulder Right;Left   Right Shoulder Extension 35 Degrees   Right Shoulder Flexion 145 Degrees   Right Shoulder ABduction 155 Degrees   Right Shoulder Internal Rotation 60 Degrees   Right Shoulder External Rotation 96 Degrees   Right Shoulder Horizontal ABduction 30 Degrees   Right Shoulder Horizontal  ADduction 106 Degrees   Left Shoulder Extension 23 Degrees   Left Shoulder Flexion 108 Degrees   Left Shoulder ABduction 118 Degrees   Left Shoulder Internal Rotation 60 Degrees   Left Shoulder External Rotation 74 Degrees   Left Shoulder Horizontal ABduction 18 Degrees   Left Shoulder Horizontal ADduction 75 Degrees   PROM   Overall PROM Comments Equal to RT shoulder   Strength   Overall Strength Comments RT shoulder 5/5, LT 4/5 with pain.    Ambulation/Gait   Gait Comments Normal  [place] Adult PT Treatment/Exercise - 07/22/14 1206    Exercises   Exercises Neck   Neck Exercises: Seated   Other Seated Exercise Stretching  to RT sidebending and rotaiton for HEP   Manual Therapy   Manual Therapy Other (comment)   Other Manual Therapy Kineseotape  Y across post and ant deltoid and over top [facility] joint.                 PT Education - 07/22/14 1243    Education provided Yes   Education Details POC, HEP self care   Person(s) Educated Patient   Methods Explanation;Demonstration;Tactile cues;Verbal cues;Handout   Comprehension Returned demonstration;Verbalized understanding          PT Short Term Goals - 07/22/14 1249    PT SHORT TERM GOAL #1   Title She will be independent with inital HEP     Time 3   Period Weeks   Status New   PT SHORT TERM GOAL #2   Title She will be able to lift arm to 110 degrees or better   Time 3   Period Weeks   Status New   PT SHORT TERM GOAL #3   Title she will report pain 30% or more decr to be able to dress with left pain   Time 3   Period Weeks   Status New           PT Long Term Goals - 07/22/14 1254    PT LONG TERM GOAL #1   Title She will be able to do all HEP issued as of lst visit   Time 6   Period Weeks   Status New   PT LONG TERM GOAL #2   Title She will report 50% decr pain or more    Time 6   Period Weeks   Status New   PT LONG TERM GOAL #3   Title She will be able to dress with min to no pain   Time 6   Period Weeks   Status New   PT LONG TERM GOAL #4   Title She will be able to move full range LT shoulder with min pain.   Time 6   Period Weeks   Status New   PT LONG TERM GOAL #5   Title She will be able to lift to waist and carry 20 pounds or more to increase home and work participation   Time 6   Period Weeks   Status New               Plan - 07/22/14 1244    Clinical Impression Statement Pain limiting active motion of Lt shoulder Strength dcreased due to pain. Both limiting ability to use arm without pain. She should improve with PT   Rehab Potential Good   PT Frequency 2x / week   PT Duration 6 weeks   PT Treatment/Interventions Therapeutic exercise;Patient/family education;Manual techniques;Passive range of motion;Cryotherapy;Ultrasound;Moist Heat;Electrical Stimulation;Dry needling   PT Next Visit Plan Modalities, retape if helpful STW, start isometrics or band exercises   PT Home Exercise Plan posture with support of Lt arm,  passive overhead stretch   Consulted and Agree with Plan of Care Patient         Problem List Patient Active Problem List   Diagnosis Date Noted  . Plantar fasciitis 12/02/2013  . Benign essential HTN 05/28/2013  . Cough 08/22/2012  . Hepatic steatosis  04/12/2011  . Back pain 06/16/2010  . HEMORRHOIDS, EXTERNAL  12/21/2009  . DEPRESSION, MAJOR, MODERATE 04/04/2007  . HAIR LOSS 08/23/2006  . OBESITY NOS 05/28/2006  . FATIGUE 05/28/2006  . GLUCOSE INTOLERANCE 05/23/2006  . Migraine 05/23/2006    Darrel Hoover PT 07/22/2014, 1:04 PM  [place] [facility] [address] [place], [place], [address] Phone: [phone]   Fax:  [phone]

## 2014-07-29 ENCOUNTER — Ambulatory Visit: Payer: PRIVATE HEALTH INSURANCE | Attending: Family Medicine

## 2014-07-29 DIAGNOSIS — M6281 Muscle weakness (generalized): Secondary | ICD-10-CM

## 2014-07-29 DIAGNOSIS — M25511 Pain in right shoulder: Secondary | ICD-10-CM | POA: Diagnosis not present

## 2014-07-29 NOTE — Therapy (Signed)
[place], [place], [address] Phone: [phone]   Fax:  [phone]  Physical Therapy Treatment  Patient Details  Name: Hailey Hopkins MRN: [id number] Date of Birth: Apr 28, 1975 Referring Provider:  Timmothy Euler, MD  Encounter Date: 07/29/2014      PT End of Session - 07/29/14 1054    Visit Number 2   Number of Visits 12   Date for PT Re-Evaluation 09/02/14   PT Start Time 6606   PT Stop Time 1110   PT Time Calculation (min) 55 min   Activity Tolerance Patient tolerated treatment well   Behavior During Therapy [facility] for tasks assessed/performed      Past Medical History  Diagnosis Date  . Diverticular disease   . Depression     Past Surgical History  Procedure Laterality Date  . Abdominal hysterectomy      There were no vitals filed for this visit.  Visit Diagnosis:  Muscle weakness of right arm  Pain in right shoulder      Subjective Assessment - 07/29/14 1011    Subjective No changes. . Tape was of some benefit with mild decreased pain.    Currently in Pain? Yes   Pain Score 8   No obvious distress.    Pain Location Shoulder   Pain Orientation Left   Pain Descriptors / Indicators Aching;Constant;Tightness   Pain Type Chronic pain   Pain Onset More than a month ago   Pain Frequency Constant   Aggravating Factors  using arm   Pain Relieving Factors Meds and cold and heat   Multiple Pain Sites No                         [facility] Adult PT Treatment/Exercise - 07/29/14 1018    Exercises   Exercises Shoulder   Shoulder Exercises: Standing   Other Standing Exercises UE ranger on wall for flexion x 12 reps and on table with horizontal abduct and adduct   Shoulder Exercises: Pulleys   Flexion 2 minutes   Modalities   Modalities Moist Heat   Moist Heat Therapy   Number Minutes Moist Heat 15 Minutes   Moist Heat Location Shoulder  LT   Manual Therapy   Manual Therapy Massage   Massage STW to LT shoulder   Other Manual Therapy Kineseotape  Y across post and ant deltoid and over top [facility] joint.and added a bicep strip                  PT Education - 07/29/14 1051    Education provided Yes   Education Details isometrics   Person(s) Educated Patient   Methods Explanation;Demonstration;Tactile cues;Verbal cues;Handout   Comprehension Returned demonstration          PT Short Term Goals - 07/22/14 1249    PT SHORT TERM GOAL #1   Title She will be independent with inital HEP    Time 3   Period Weeks   Status New   PT SHORT TERM GOAL #2   Title She will be able to lift arm to 110 degrees or better   Time 3   Period Weeks   Status New   PT SHORT TERM GOAL #3   Title she will report pain 30% or more decr to be able to dress with left pain   Time 3   Period Weeks   Status New  PT Long Term Goals - 07/22/14 1254    PT LONG TERM GOAL #1   Title She will be able to do all HEP issued as of lst visit   Time 6   Period Weeks   Status New   PT LONG TERM GOAL #2   Title She will report 50% decr pain or more    Time 6   Period Weeks   Status New   PT LONG TERM GOAL #3   Title She will be able to dress with min to no pain   Time 6   Period Weeks   Status New   PT LONG TERM GOAL #4   Title She will be able to move full range LT shoulder with min pain.   Time 6   Period Weeks   Status New   PT LONG TERM GOAL #5   Title She will be able to lift to waist and carry 20 pounds or more to increase home and work participation   Time 6   Period Weeks   Status New               Plan - 07/29/14 1054    Clinical Impression Statement She was able to do all treatment with good tolerance Passive motion still WNL. REtaped as this was somewhat helpful   PT Next Visit Plan Continue modalities, retape if indicated, review isometrics, cont UE ranger.    PT Home Exercise Plan isometrics   Consulted and Agree with  Plan of Care Patient        Problem List Patient Active Problem List   Diagnosis Date Noted  . Plantar fasciitis 12/02/2013  . Benign essential HTN 05/28/2013  . Cough 08/22/2012  . Hepatic steatosis 04/12/2011  . Back pain 06/16/2010  . HEMORRHOIDS, EXTERNAL 12/21/2009  . DEPRESSION, MAJOR, MODERATE 04/04/2007  . HAIR LOSS 08/23/2006  . OBESITY NOS 05/28/2006  . FATIGUE 05/28/2006  . GLUCOSE INTOLERANCE 05/23/2006  . Migraine 05/23/2006    Darrel Hoover PT 07/29/2014, 10:57 AM  [facility] [address] [place], [place], [address] Phone: [phone]   Fax:  [phone]

## 2014-07-29 NOTE — Patient Instructions (Signed)
Strengthening: Isometric Flexion  Using wall for resistance, press right fist into ball using light pressure. Hold 5-20____ seconds. Repeat ___5-10_ times per set. Do __1__ sets per session. Do ___1-2_ sessions per day.              DO ALL EXERCISE SAME  SHOULDER: Abduction (Isometric)  Use wall as resistance. Press arm against pillow. Keep elbow straight. Hold ___ seconds. ___ reps per set, ___ sets per day, ___ days per week  Extension (Isometric)  Place left bent elbow and back of arm against wall. Press elbow against wall. Hold ____ seconds. Repeat ____ times. Do ____ sessions per day.  Internal Rotation (Isometric)  Place palm of right fist against door frame, with elbow bent. Press fist against door frame. Hold ____ seconds. Repeat ____ times. Do ____ sessions per day.  External Rotation (Isometric)  Place back of left fist against door frame, with elbow bent. Press fist against door frame. Hold ____ seconds. Repeat ____ times. Do ____ sessions per day.  Copyright  VHI. All rights reserved.

## 2014-08-02 ENCOUNTER — Ambulatory Visit (HOSPITAL_COMMUNITY)
Admission: RE | Admit: 2014-08-02 | Discharge: 2014-08-02 | Disposition: A | Discharge status: Home / Self Care | Payer: PRIVATE HEALTH INSURANCE | Source: Ambulatory Visit | Attending: Orthopedic Surgery | Admitting: Orthopedic Surgery

## 2014-08-02 DIAGNOSIS — M25512 Pain in left shoulder: Secondary | ICD-10-CM | POA: Insufficient documentation

## 2014-08-02 MED ORDER — GADOBENATE DIMEGLUMINE 529 MG/ML IV SOLN
5.0000 mL | Freq: Once | INTRAVENOUS | Status: AC | PRN
  Administered 2014-08-02: 0.1 mL via INTRAVENOUS

## 2014-08-02 MED ORDER — LIDOCAINE HCL (PF) 1 % IJ SOLN
INTRAMUSCULAR | Status: AC
  Filled 2014-08-02: qty 15

## 2014-08-02 MED ORDER — IOHEXOL 180 MG/ML  SOLN
20.0000 mL | Freq: Once | INTRAMUSCULAR | Status: AC | PRN
  Administered 2014-08-02: 11 mL via INTRA_ARTICULAR

## 2014-08-04 ENCOUNTER — Ambulatory Visit: Payer: PRIVATE HEALTH INSURANCE

## 2014-08-04 DIAGNOSIS — M6281 Muscle weakness (generalized): Secondary | ICD-10-CM | POA: Diagnosis not present

## 2014-08-04 DIAGNOSIS — M25511 Pain in right shoulder: Secondary | ICD-10-CM

## 2014-08-04 NOTE — Therapy (Signed)
[place], [place], [address] Phone: [phone]   Fax:  [phone]  Physical Therapy Treatment  Patient Details  Name: Hailey Hopkins MRN: [id number] Date of Birth: Jun 04, 1975 Referring Provider:  Timmothy Euler, MD  Encounter Date: 08/04/2014      PT End of Session - 08/04/14 0844    Visit Number 3   Number of Visits 12   Date for PT Re-Evaluation 09/02/14   PT Start Time 0840   PT Stop Time 0930   PT Time Calculation (min) 50 min   Activity Tolerance Patient limited by pain   Behavior During Therapy [facility] for tasks assessed/performed      Past Medical History  Diagnosis Date  . Diverticular disease   . Depression     Past Surgical History  Procedure Laterality Date  . Abdominal hysterectomy      There were no vitals filed for this visit.  Visit Diagnosis:  Muscle weakness of right arm  Pain in right shoulder      Subjective Assessment - 08/04/14 0840    Subjective Increased pain from procedure.    Currently in Pain? Yes   Pain Score 9   Thinks from injection.    Pain Location Shoulder   Pain Orientation Left   Pain Descriptors / Indicators Aching;Constant   Pain Type Chronic pain   Pain Onset More than a month ago   Pain Frequency Constant   Aggravating Factors  using arm and from procedure   Pain Relieving Factors meds , heat and cold   Multiple Pain Sites No                         [facility] Adult PT Treatment/Exercise - 08/04/14 0843    Shoulder Exercises: Pulleys   Flexion 2 minutes  she is very cautious   Shoulder Exercises: Isometric Strengthening   Flexion 5X5"   Extension 5X5"   External Rotation 5X5"   Internal Rotation 5X5"   ABduction 5X5"   ADduction 5X5"   Modalities   Modalities Electrical Stimulation   Moist Heat Therapy   Number Minutes Moist Heat 15 Minutes   Moist Heat Location Shoulder  LT   Electrical Stimulation   Electrical Stimulation Location LT Shoulder   Electrical Stimulation Action IFC   Electrical Stimulation Parameters L8   Electrical Stimulation Goals Pain   Manual Therapy   Massage STW to whole shoulder. Also PROM all planes x 1,                   PT Short Term Goals - 07/22/14 1249    PT SHORT TERM GOAL #1   Title She will be independent with inital HEP    Time 3   Period Weeks   Status New   PT SHORT TERM GOAL #2   Title She will be able to lift arm to 110 degrees or better   Time 3   Period Weeks   Status New   PT SHORT TERM GOAL #3   Title she will report pain 30% or more decr to be able to dress with left pain   Time 3   Period Weeks   Status New           PT Long Term Goals - 07/22/14 1254    PT LONG TERM GOAL #1   Title She will be able to do all HEP issued as of lst visit  Time 6   Period Weeks   Status New   PT LONG TERM GOAL #2   Title She will report 50% decr pain or more    Time 6   Period Weeks   Status New   PT LONG TERM GOAL #3   Title She will be able to dress with min to no pain   Time 6   Period Weeks   Status New   PT LONG TERM GOAL #4   Title She will be able to move full range LT shoulder with min pain.   Time 6   Period Weeks   Status New   PT LONG TERM GOAL #5   Title She will be able to lift to waist and carry 20 pounds or more to increase home and work participation   Time [place] - 08/04/14 0844    Clinical Impression Statement Scan report appears to indicate unremarkable changes in shoulder (this was not discussed with [name]) but she needs to speak to Dr Tamera Punt to have his opinion. Pain cont  to be limiting factor and she probably needs to push range and stength but she will most likely be reluctant. . Passive range equal to LRT but with pain all directions    Pt will benefit from skilled therapeutic intervention in order to improve on the following deficits  Impaired UE functional use;Pain;Decreased strength;Decreased range of motion   Rehab Potential Good   PT Frequency 2x / week   PT Duration 6 weeks   PT Next Visit Plan Continue modalities and mobs /stretching ,strenght as able   Consulted and Agree with Plan of Care Patient        Problem List Patient Active Problem List   Diagnosis Date Noted  . Plantar fasciitis 12/02/2013  . Benign essential HTN 05/28/2013  . Cough 08/22/2012  . Hepatic steatosis 04/12/2011  . Back pain 06/16/2010  . HEMORRHOIDS, EXTERNAL 12/21/2009  . DEPRESSION, MAJOR, MODERATE 04/04/2007  . HAIR LOSS 08/23/2006  . OBESITY NOS 05/28/2006  . FATIGUE 05/28/2006  . GLUCOSE INTOLERANCE 05/23/2006  . Migraine 05/23/2006    Darrel Hoover PT 08/04/2014, 9:17 AM  [facility] [address] [place], [place], [address] Phone: [phone]   Fax:  [phone]

## 2014-08-09 ENCOUNTER — Encounter: Payer: Self-pay | Admitting: Physical Therapy

## 2014-08-11 ENCOUNTER — Ambulatory Visit: Payer: PRIVATE HEALTH INSURANCE | Admitting: Physical Therapy

## 2014-08-11 DIAGNOSIS — M6281 Muscle weakness (generalized): Secondary | ICD-10-CM | POA: Diagnosis not present

## 2014-08-11 DIAGNOSIS — M25611 Stiffness of right shoulder, not elsewhere classified: Secondary | ICD-10-CM

## 2014-08-11 DIAGNOSIS — R293 Abnormal posture: Secondary | ICD-10-CM

## 2014-08-11 DIAGNOSIS — M25511 Pain in right shoulder: Secondary | ICD-10-CM

## 2014-08-11 NOTE — Patient Instructions (Signed)
Remove tape if irritating.  May shower.

## 2014-08-11 NOTE — Therapy (Signed)
[place], [place], [address] Phone: [phone]   Fax:  [phone]  Physical Therapy Treatment  Patient Details  Name: Hailey Hopkins MRN: [id number] Date of Birth: 22-Mar-1976 Referring Provider:  Timmothy Euler, MD  Encounter Date: 08/11/2014      PT End of Session - 08/11/14 1254    Visit Number 4   Number of Visits 12   Date for PT Re-Evaluation 09/02/14   PT Start Time 0930   PT Stop Time 1030   PT Time Calculation (min) 60 min   Activity Tolerance Patient tolerated treatment well;Patient limited by pain      Past Medical History  Diagnosis Date  . Diverticular disease   . Depression     Past Surgical History  Procedure Laterality Date  . Abdominal hysterectomy      There were no vitals filed for this visit.  Visit Diagnosis:  Muscle weakness of right arm  Pain in right shoulder  Stiffness of right shoulder joint  Abnormal posture      Subjective Assessment - 08/11/14 0922    Subjective 4/10   Currently in Pain? Yes   Pain Score 4    Pain Location Shoulder   Pain Orientation Left;Anterior   Pain Descriptors / Indicators --  pinching pain   Pain Radiating Towards N/A   Pain Frequency Constant   Aggravating Factors  moving arm   Pain Relieving Factors heat, medication, not moving   Multiple Pain Sites No                         [facility] Adult PT Treatment/Exercise - 08/11/14 0940    Elbow Exercises   Elbow Flexion --  4 LBS 20 reps flexion LT   Wrist Exercises   Wrist Flexion --  4 LBS 20 reps, wrist supported on thigh   Bar Weights/Barbell (Wrist Extension) 4 lbs  20 reps   Wrist Radial Deviation --  4 LBS 10 reps     Other wrist exercises Rt grip LBS.  60, 74, 65.  Lt  21, 25, 30 LBS LT  pulling noted anterior shoulder   Moist Heat Therapy   Number Minutes Moist Heat 15 Minutes   Moist Heat Location Shoulder   Manual Therapy   Manual  Therapy Taping   Other Manual Therapy also fan for edema   Kinesiotex Edema;Create Space;Inhibit Muscle  Pec major, clavicular portion inhibition. X anterior shoulde                  PT Short Term Goals - 07/22/14 1249    PT SHORT TERM GOAL #1   Title She will be independent with inital HEP    Time 3   Period Weeks   Status New   PT SHORT TERM GOAL #2   Title She will be able to lift arm to 110 degrees or better   Time 3   Period Weeks   Status New   PT SHORT TERM GOAL #3   Title she will report pain 30% or more decr to be able to dress with left pain   Time 3   Period Weeks   Status New           PT Long Term Goals - 07/22/14 1254    PT LONG TERM GOAL #1   Title She will be able to do all HEP issued as of lst visit   Time  6   Period Weeks   Status New   PT LONG TERM GOAL #2   Title She will report 50% decr pain or more    Time 6   Period Weeks   Status New   PT LONG TERM GOAL #3   Title She will be able to dress with min to no pain   Time 6   Period Weeks   Status New   PT LONG TERM GOAL #4   Title She will be able to move full range LT shoulder with min pain.   Time 6   Period Weeks   Status New   PT LONG TERM GOAL #5   Title She will be able to lift to waist and carry 20 pounds or more to increase home and work participation   Time [place] - 08/11/14 1257    Clinical Impression Statement Pecs tender, edematous.  I hope tape will help pain enough so she can exercise next session.  No new goals met.   PT Next Visit Plan assess  taping   Consulted and Agree with Plan of Care Patient        Problem List Patient Active Problem List   Diagnosis Date Noted  . Plantar fasciitis 12/02/2013  . Benign essential HTN 05/28/2013  . Cough 08/22/2012  . Hepatic steatosis 04/12/2011  . Back pain 06/16/2010  . HEMORRHOIDS, EXTERNAL 12/21/2009  . DEPRESSION, MAJOR, MODERATE 04/04/2007  . HAIR LOSS  08/23/2006  . OBESITY NOS 05/28/2006  . FATIGUE 05/28/2006  . GLUCOSE INTOLERANCE 05/23/2006  . Migraine 05/23/2006    Tobie Perdue PTA 08/11/2014, 1:01 PM  [facility] [address] [place], [place], [address] Phone: [phone]   Fax:  [phone]

## 2014-08-12 ENCOUNTER — Encounter: Payer: Self-pay | Admitting: Family Medicine

## 2014-08-12 ENCOUNTER — Ambulatory Visit (INDEPENDENT_AMBULATORY_CARE_PROVIDER_SITE_OTHER): Payer: 59 | Admitting: Family Medicine

## 2014-08-12 VITALS — BP 132/88 | HR 88 | Ht 66.0 in | Wt 232.4 lb

## 2014-08-12 DIAGNOSIS — I10 Essential (primary) hypertension: Secondary | ICD-10-CM | POA: Diagnosis not present

## 2014-08-12 DIAGNOSIS — G43109 Migraine with aura, not intractable, without status migrainosus: Secondary | ICD-10-CM | POA: Diagnosis not present

## 2014-08-12 DIAGNOSIS — F321 Major depressive disorder, single episode, moderate: Secondary | ICD-10-CM

## 2014-08-12 NOTE — Assessment & Plan Note (Signed)
Controlled with 3 HAs in last 4 months FMLA paperwork modification - 48 hours per episode, up to 2 per month, awaiting fax Some sleepiness with topamax, discussed decreasing and she would like to continue current dose Mobic for abortive therapy 4 month f/u, may change PCP

## 2014-08-12 NOTE — Progress Notes (Signed)
Patient ID: Hailey Hopkins, female   DOB: 04/26/1975, 39 y.o.   MRN: [id number]   HPI  Patient presents today for left  Headaches Migraine headaches, doing much better on 100 mg of Topamax. Previously failed Effexor. States that she's had 3 major headaches the last 4 months, to the cost her not to go to work. She states that she needs longer than 24 hours per episode as they may happen in the middle the day and may last longer than the beginning of the next work day, she requests 48 hours per episode on FMLA paperwork. Takes Motrin for abortive therapy.  Hypertension Taking HCTZ on work days only, No chest pain, dyspnea, palpitations, leg edema Headaches Per above  Mood Doing well on bupropion, feels well controlled, enjoying work  Notably she states she may change PCP as she does not like the change in residents every few years  Past medical history-Smoking status noted never smoker ROS: Per HPI  Objective: BP 132/88 mmHg  Pulse 88  Ht 5\' 6"  (1.676 m)  Wt 232 lb 6.4 oz (105.416 kg)  BMI 37.53 kg/m2 Gen: NAD, alert, cooperative with exam HEENT: NCAT CV: RRR, good S1/S2, no murmur Resp: CTABL, no wheezes, non-labored Ext: No edema, warm Neuro: Alert and oriented, No gross deficits  Assessment and plan:  Benign essential HTN Controlled Not taking HCTZ daily, recommended that Annual labs in july    Migraine Controlled with 3 HAs in last 4 months FMLA paperwork modification - 48 hours per episode, up to 2 per month, awaiting fax Some sleepiness with topamax, discussed decreasing and she would like to continue current dose Mobic for abortive therapy 4 month f/u, may change PCP    DEPRESSION, MAJOR, MODERATE Doing well on bupropion, continue Affect very appropriate today

## 2014-08-12 NOTE — Patient Instructions (Signed)
Great to see you!  I would recommend HCTZ every day  Follow up in 4 months

## 2014-08-12 NOTE — Assessment & Plan Note (Signed)
Doing well on bupropion, continue Affect very appropriate today

## 2014-08-12 NOTE — Assessment & Plan Note (Signed)
Controlled Not taking HCTZ daily, recommended that Annual labs in july

## 2014-08-13 ENCOUNTER — Ambulatory Visit: Payer: PRIVATE HEALTH INSURANCE

## 2014-08-13 DIAGNOSIS — M25511 Pain in right shoulder: Secondary | ICD-10-CM

## 2014-08-13 DIAGNOSIS — M6281 Muscle weakness (generalized): Secondary | ICD-10-CM | POA: Diagnosis not present

## 2014-08-13 NOTE — Therapy (Signed)
Hailey Hopkins, [place], [address] Phone: [phone]   Fax:  [phone]  Physical Therapy Treatment  Patient Details  Name: Hailey Hopkins MRN: [id number] Date of Birth: 11-15-1975 Referring Provider:  Timmothy Euler, MD  Encounter Date: 08/13/2014      PT End of Session - 08/13/14 0918    Visit Number 5   Number of Visits 12   Date for PT Re-Evaluation 09/02/14   PT Start Time 0832   PT Stop Time 0915   PT Time Calculation (min) 43 min   Activity Tolerance Patient tolerated treatment well   Behavior During Therapy [facility] for tasks assessed/performed      Past Medical History  Diagnosis Date  . Diverticular disease   . Depression     Past Surgical History  Procedure Laterality Date  . Abdominal hysterectomy      There were no vitals filed for this visit.  Visit Diagnosis:  Muscle weakness of right arm  Pain in right shoulder      Subjective Assessment - 08/13/14 0839    Subjective Shoulder doing better 2/10   Currently in Pain? Yes   Pain Score 2    Multiple Pain Sites No                         [facility] Adult PT Treatment/Exercise - 08/13/14 0839    Elbow Exercises   Elbow Flexion Strengthening;20 reps  4 pounds   Wrist Flexion Strengthening;Left;20 reps  4 pounds and radial devicaton x20 4 pounds   Neck Exercises: Machines for Strengthening   UBE (Upper Arm Bike) 120 RPM 4 min    Manual Therapy   Manual Therapy Taping   Soft tissue mobilization STW to whole shoulder with concentration on anterior shoulder/pectorals. Also PROM all planes x 1,    Other Manual Therapy also fan for edema   Kinesiotex Edema;Create Space;Inhibit Muscle                  PT Short Term Goals - 08/13/14 0920    PT SHORT TERM GOAL #1   Title She will be independent with inital HEP    Status Achieved   PT SHORT TERM GOAL #2   Title She will be able to lift arm to 110  degrees or better   Status On-going   PT SHORT TERM GOAL #3   Title she will report pain 30% or more decr to be able to dress with left pain   Status Achieved           PT Long Term Goals - 07/22/14 1254    PT LONG TERM GOAL #1   Title She will be able to do all HEP issued as of lst visit   Time 6   Period Weeks   Status New   PT LONG TERM GOAL #2   Title She will report 50% decr pain or more    Time 6   Period Weeks   Status New   PT LONG TERM GOAL #3   Title She will be able to dress with min to no pain   Time 6   Period Weeks   Status New   PT LONG TERM GOAL #4   Title She will be able to move full range LT shoulder with min pain.   Time 6   Period Weeks   Status New   PT LONG TERM GOAL #5  Title She will be able to lift to waist and carry 20 pounds or more to increase home and work participation   Time [place] - 08/13/14 0919    Clinical Impression Statement She is doing better with less pain She is stretching at home . Will continue with pain control for 1-2 more sessions then start more strength for shoulder   PT Next Visit Plan Continue taping, manual and exercise . Continue to not push strength at shoulder for 1-2 more sessions then start gentle resisted shoulder exercise and progress as able   Consulted and Agree with Plan of Care Patient        Problem List Patient Active Problem List   Diagnosis Date Noted  . Plantar fasciitis 12/02/2013  . Benign essential HTN 05/28/2013  . Hepatic steatosis 04/12/2011  . Back pain 06/16/2010  . HEMORRHOIDS, EXTERNAL 12/21/2009  . DEPRESSION, MAJOR, MODERATE 04/04/2007  . HAIR LOSS 08/23/2006  . OBESITY NOS 05/28/2006  . FATIGUE 05/28/2006  . GLUCOSE INTOLERANCE 05/23/2006  . Migraine 05/23/2006    Darrel Hoover PT 08/13/2014, 9:22 AM  [facility] [address] [place], [place], [address] Phone:  [phone]   Fax:  [phone]

## 2014-08-16 ENCOUNTER — Ambulatory Visit: Payer: PRIVATE HEALTH INSURANCE | Admitting: Physical Therapy

## 2014-08-16 DIAGNOSIS — M6281 Muscle weakness (generalized): Secondary | ICD-10-CM

## 2014-08-16 DIAGNOSIS — M25511 Pain in right shoulder: Secondary | ICD-10-CM

## 2014-08-16 DIAGNOSIS — M25611 Stiffness of right shoulder, not elsewhere classified: Secondary | ICD-10-CM

## 2014-08-16 DIAGNOSIS — R293 Abnormal posture: Secondary | ICD-10-CM

## 2014-08-16 NOTE — Patient Instructions (Signed)
SHOULDER: External Rotation - Supine (Cane)   Hold cane with both hands. Rotate arm away from body. Keep elbow on floor and next to body. __10_ reps per set, __2_ sets per day, _7__ days per week Add towel to keep elbow at side.  Copyright  VHI. All rights reserved.  Cane Horizontal - Supine   With straight arms holding cane above shoulders, bring cane out to right, center, out to left, and back to above head. Repeat __10_ times. Do _2__ times per day.  Copyright  VHI. All rights reserved.  Cane Exercise: Flexion   Lie on back, holding cane above chest. Keeping arms as straight as possible, lower cane toward floor beyond head. Hold __5__ seconds. Repeat _10-20___ times. Do __2__ sessions per day.  http://gt2.exer.us/91   Copyright  VHI. All rights reserved.

## 2014-08-16 NOTE — Therapy (Signed)
[place], [place], [address] Phone: [phone]   Fax:  [phone]  Physical Therapy Treatment  Patient Details  Name: Hailey Hopkins MRN: [id number] Date of Birth: 1976-02-13 Referring Provider:  Timmothy Euler, MD  Encounter Date: 08/16/2014      PT End of Session - 08/16/14 1053    Visit Number 6   Number of Visits 12   Date for PT Re-Evaluation 09/02/14   PT Start Time 5374   PT Stop Time 1052   PT Time Calculation (min) 38 min      Past Medical History  Diagnosis Date  . Diverticular disease   . Depression     Past Surgical History  Procedure Laterality Date  . Abdominal hysterectomy      There were no vitals filed for this visit.  Visit Diagnosis:  Muscle weakness of right arm  Pain in right shoulder  Stiffness of right shoulder joint  Abnormal posture          [facility] PT Assessment - 08/16/14 1016    AROM   Left Shoulder Flexion 140 Degrees   Left Shoulder ABduction 140 Degrees                     [facility] Adult PT Treatment/Exercise - 08/16/14 1040    Elbow Exercises   Elbow Flexion Strengthening;20 reps  4 pounds   Wrist Flexion Strengthening;Left;20 reps  4 pounds and radial devicaton x20 4 pounds   Shoulder Exercises: Supine   Other Supine Exercises supine cane exercises x 10 each with some increase in pain. Cues to keep in pain free rom- added to HEP   Shoulder Exercises: Pulleys   Flexion 2 minutes  she is very cautious   ABduction 2 minutes   Shoulder Exercises: ROM/Strengthening   UBE (Upper Arm Bike) 120 rpm 2 min for 2 min back   Other ROM/Strengthening Exercises wall ladder and wall slides x 5 each for flexion                PT Education - 08/16/14 1048    Education provided Yes   Person(s) Educated Patient   Methods Explanation;Handout   Comprehension Verbalized understanding          PT Short Term Goals - 08/16/14  1017    PT SHORT TERM GOAL #1   Title She will be independent with inital HEP    Time 3   Period Weeks   Status Achieved   PT SHORT TERM GOAL #2   Title She will be able to lift arm to 110 degrees or better   Time 3   Period Weeks   Status Achieved   PT SHORT TERM GOAL #3   Title she will report pain 30% or more decr to be able to dress with left pain   Time 3   Period Weeks   Status Achieved           PT Long Term Goals - 08/16/14 1018    PT LONG TERM GOAL #1   Title She will be able to do all HEP issued as of lst visit   Time 6   Period Weeks   Status On-going   PT LONG TERM GOAL #2   Title She will report 50% decr pain or more    Time 6   Period Weeks   Status On-going   PT LONG TERM GOAL #3   Title She will  be able to dress with min to no pain   Time 6   Period Weeks   Status On-going   PT LONG TERM GOAL #4   Title She will be able to move full range LT shoulder with min pain.   Time 6   Period Weeks   Status On-going   PT LONG TERM GOAL #5   Title She will be able to lift to waist and carry 20 pounds or more to increase home and work participation   Time 6   Period Weeks   Status On-going               Plan - 08/16/14 1041    Clinical Impression Statement Pt demonstrated increased AROM of left shoulder with less pain. She reports 30% decrease in overall pain and is dressing with less pain. She is still very cautious with her movements and pain icreased to 4/10 with UBE, pulleys and supine cane exercises. She developed a rash from KT tape so she removed it.  All STGs achieved. She declined modalities.    PT Next Visit Plan Continue taping, manual and exercise . Continue to not push strength at shoulder for 1 more sessions then start gentle resisted shoulder exercise and progress as able        Problem List Patient Active Problem List   Diagnosis Date Noted  . Plantar fasciitis 12/02/2013  . Benign essential HTN 05/28/2013  . Hepatic steatosis  04/12/2011  . Back pain 06/16/2010  . HEMORRHOIDS, EXTERNAL 12/21/2009  . DEPRESSION, MAJOR, MODERATE 04/04/2007  . HAIR LOSS 08/23/2006  . OBESITY NOS 05/28/2006  . FATIGUE 05/28/2006  . GLUCOSE INTOLERANCE 05/23/2006  . Migraine 05/23/2006    Dorene Ar, PTA 08/16/2014, 10:54 AM  [facility] [address] [place], [place], [address] Phone: [phone]   Fax:  [phone]

## 2014-08-18 ENCOUNTER — Encounter: Payer: Self-pay | Admitting: Physical Therapy

## 2014-08-24 ENCOUNTER — Ambulatory Visit: Payer: PRIVATE HEALTH INSURANCE | Admitting: Physical Therapy

## 2014-08-24 DIAGNOSIS — M6281 Muscle weakness (generalized): Secondary | ICD-10-CM

## 2014-08-24 DIAGNOSIS — M25611 Stiffness of right shoulder, not elsewhere classified: Secondary | ICD-10-CM

## 2014-08-24 DIAGNOSIS — M25511 Pain in right shoulder: Secondary | ICD-10-CM

## 2014-08-24 NOTE — Therapy (Signed)
[place], [place], [address] Phone: [phone]   Fax:  [phone]  Physical Therapy Treatment  Patient Details  Name: Hailey Hopkins MRN: [id number] Date of Birth: [birth year] Referring Provider:  Timmothy Euler, MD  Encounter Date: 08/24/2014      PT End of Session - 08/24/14 1746    Visit Number 7   Number of Visits 12   Date for PT Re-Evaluation 09/02/14   PT Start Time 0934   PT Stop Time 1025   PT Time Calculation (min) 51 min   Activity Tolerance Patient tolerated treatment well;Patient limited by pain   Behavior During Therapy [facility] for tasks assessed/performed      Past Medical History  Diagnosis Date  . Diverticular disease   . Depression     Past Surgical History  Procedure Laterality Date  . Abdominal hysterectomy      There were no vitals filed for this visit.  Visit Diagnosis:  Muscle weakness of right arm  Pain in right shoulder  Stiffness of right shoulder joint      Subjective Assessment - 08/24/14 0958    Subjective Sees  MD  [facility].  Has been doing her home exercises   Pain Score 3    Pain Location Shoulder   Pain Orientation Left;Anterior   Pain Descriptors / Indicators Heaviness;Throbbing  weak   Aggravating Factors  moving arm   Pain Relieving Factors heat, medication, not moving   Multiple Pain Sites No                         [facility] Adult PT Treatment/Exercise - 08/24/14 0950    Shoulder Exercises: Supine   Other Supine Exercises Supine cane 10 reps each,  140 degrees AA flexion   Shoulder Exercises: Seated   Other Seated Exercises 4 LBS 2 sets   Wrist extension, radial deviation   Wrist Exercises   Wrist Radial Deviation 10 reps  4 LBS    Other wrist exercises --  flexion, 4 LB, 10 reps, cued   Moist Heat Therapy   Number Minutes Moist Heat 15 Minutes   Moist Heat Location Shoulder  Posterior neck   Manual Therapy   Manual Therapy Passive ROM;Taping  Better with distraction. Relieves anterior and superior pain   Passive ROM Lt ShoulderFlexion, ABD ER all limited by pain.  Stayed within her limitsof pain, small movements tolerated.     Kinesiotex Inhibit Muscle                  PT Short Term Goals - 08/16/14 1017    PT SHORT TERM GOAL #1   Title She will be independent with inital HEP    Time 3   Period Weeks   Status Achieved   PT SHORT TERM GOAL #2   Title She will be able to lift arm to 110 degrees or better   Time 3   Period Weeks   Status Achieved   PT SHORT TERM GOAL #3   Title she will report pain 30% or more decr to be able to dress with left pain   Time 3   Period Weeks   Status Achieved           PT Long Term Goals - 08/16/14 1018    PT LONG TERM GOAL #1   Title She will be able to do all HEP issued as of lst visit  Time 6   Period Weeks   Status On-going   PT LONG TERM GOAL #2   Title She will report 50% decr pain or more    Time 6   Period Weeks   Status On-going   PT LONG TERM GOAL #3   Title She will be able to dress with min to no pain   Time 6   Period Weeks   Status On-going   PT LONG TERM GOAL #4   Title She will be able to move full range LT shoulder with min pain.   Time 6   Period Weeks   Status On-going   PT LONG TERM GOAL #5   Title She will be able to lift to waist and carry 20 pounds or more to increase home and work participation   Time 6   Period Weeks   Status On-going               Plan - 08/24/14 1749    PT Next Visit Plan isometrics , progress reps.  scapular rows?        Problem List Patient Active Problem List   Diagnosis Date Noted  . Plantar fasciitis 12/02/2013  . Benign essential HTN 05/28/2013  . Hepatic steatosis 04/12/2011  . Back pain 06/16/2010  . HEMORRHOIDS, EXTERNAL 12/21/2009  . DEPRESSION, MAJOR, MODERATE 04/04/2007  . HAIR LOSS 08/23/2006  . OBESITY NOS 05/28/2006  . FATIGUE 05/28/2006  .  GLUCOSE INTOLERANCE 05/23/2006  . Migraine 05/23/2006    Cadey Bazile 08/24/2014, 5:58 PM  [facility] [address] [place], [place], [address] Phone: [phone]   Fax:  [phone]   Melvenia Needles, PTA 08/24/2014 5:58 PM Phone: [phone] Fax: [phone]

## 2014-08-24 NOTE — Patient Instructions (Addendum)
Remove tape if any irritation. Work on posture off and on.  Pain free activities at home.  Continue exercises.

## 2014-08-26 ENCOUNTER — Ambulatory Visit: Payer: PRIVATE HEALTH INSURANCE | Attending: Family Medicine | Admitting: Physical Therapy

## 2014-08-26 DIAGNOSIS — M6281 Muscle weakness (generalized): Secondary | ICD-10-CM | POA: Diagnosis present

## 2014-08-26 DIAGNOSIS — M25612 Stiffness of left shoulder, not elsewhere classified: Secondary | ICD-10-CM | POA: Insufficient documentation

## 2014-08-26 DIAGNOSIS — M25611 Stiffness of right shoulder, not elsewhere classified: Secondary | ICD-10-CM | POA: Diagnosis present

## 2014-08-26 DIAGNOSIS — M25511 Pain in right shoulder: Secondary | ICD-10-CM | POA: Insufficient documentation

## 2014-08-26 DIAGNOSIS — G8929 Other chronic pain: Secondary | ICD-10-CM | POA: Insufficient documentation

## 2014-08-26 DIAGNOSIS — M25512 Pain in left shoulder: Secondary | ICD-10-CM | POA: Insufficient documentation

## 2014-08-26 NOTE — Therapy (Signed)
[place], [place], [address] Phone: [phone]   Fax:  [phone]  Physical Therapy Treatment  Patient Details  Name: Hailey Hopkins MRN: [id number] Date of Birth: 1975-05-13 Referring Provider:  Timmothy Euler, MD  Encounter Date: 08/26/2014      PT End of Session - 08/26/14 1049    Visit Number 8   Number of Visits 12   Date for PT Re-Evaluation 09/02/14   PT Start Time 0845   PT Stop Time 0950   PT Time Calculation (min) 65 min   Activity Tolerance Patient tolerated treatment well;Patient limited by pain   Behavior During Therapy [facility] for tasks assessed/performed      Past Medical History  Diagnosis Date  . Diverticular disease   . Depression     Past Surgical History  Procedure Laterality Date  . Abdominal hysterectomy      There were no vitals filed for this visit.  Visit Diagnosis:  Pain in right shoulder  Stiffness of right shoulder joint      Subjective Assessment - 08/26/14 0855    Subjective Spasms more,  has sharp pain upper trap area 7/10   Currently in Pain? Yes   Pain Score 7    Pain Location Shoulder   Pain Orientation Right;Anterior   Pain Descriptors / Indicators Aching;Spasm   Pain Radiating Towards Neck   Aggravating Factors  Moving    Pain Relieving Factors heat   Multiple Pain Sites No                         [facility] Adult PT Treatment/Exercise - 08/26/14 0900    Shoulder Exercises: Seated   External Rotation Limitations 70   Flexion Limitations 140   ABduction Limitations 70   Moist Heat Therapy   Number Minutes Moist Heat 20 Minutes   Moist Heat Location Shoulder  And Neck   Ultrasound   Ultrasound Location Shoulder   Ultrasound Parameters 50-20%   Ultrasound Goals Pain  Hand feels like a tourniquet was removed after you give bloo   Manual Therapy   Kinesiotex --  upper trap inhibition, both   Neck Exercises: Stretches   Upper Trapezius Stretch 1 rep;10 seconds  Also taped both to inhibit, kinesiotex                PT Education - 08/26/14 1054    Education provided Yes   Education Details Sitting posture reviewed, discussed   Person(s) Educated Patient   Methods Explanation;Demonstration   Comprehension Verbalized understanding;Returned demonstration          PT Short Term Goals - 08/16/14 1017    PT SHORT TERM GOAL #1   Title She will be independent with inital HEP    Time 3   Period Weeks   Status Achieved   PT SHORT TERM GOAL #2   Title She will be able to lift arm to 110 degrees or better   Time 3   Period Weeks   Status Achieved   PT SHORT TERM GOAL #3   Title she will report pain 30% or more decr to be able to dress with left pain   Time 3   Period Weeks   Status Achieved           PT Long Term Goals - 08/16/14 1018    PT LONG TERM GOAL #1   Title She will be able to do all  HEP issued as of lst visit   Time 6   Period Weeks   Status On-going   PT LONG TERM GOAL #2   Title She will report 50% decr pain or more    Time 6   Period Weeks   Status On-going   PT LONG TERM GOAL #3   Title She will be able to dress with min to no pain   Time 6   Period Weeks   Status On-going   PT LONG TERM GOAL #4   Title She will be able to move full range LT shoulder with min pain.   Time 6   Period Weeks   Status On-going   PT LONG TERM GOAL #5   Title She will be able to lift to waist and carry 20 pounds or more to increase home and work participation   Time 6   Period Weeks   Status On-going               Plan - 08/26/14 1050    Clinical Impression Statement Pain not yet improving.  Modalities tried today.   No new goals met.   PT Next Visit Plan isometrics , progress reps.  scapular rows?   Consulted and Agree with Plan of Care Patient        Problem List Patient Active Problem List   Diagnosis Date Noted  . Plantar fasciitis 12/02/2013  . Benign  essential HTN 05/28/2013  . Hepatic steatosis 04/12/2011  . Back pain 06/16/2010  . HEMORRHOIDS, EXTERNAL 12/21/2009  . DEPRESSION, MAJOR, MODERATE 04/04/2007  . HAIR LOSS 08/23/2006  . OBESITY NOS 05/28/2006  . FATIGUE 05/28/2006  . GLUCOSE INTOLERANCE 05/23/2006  . Migraine 05/23/2006    HARRIS,KAREN 08/26/2014, 10:55 AM  [facility] [address] [place], [place], [address] Phone: [phone]   Fax:  [phone]

## 2014-09-20 ENCOUNTER — Ambulatory Visit: Payer: PRIVATE HEALTH INSURANCE

## 2014-09-20 DIAGNOSIS — M25612 Stiffness of left shoulder, not elsewhere classified: Secondary | ICD-10-CM

## 2014-09-20 DIAGNOSIS — M6281 Muscle weakness (generalized): Secondary | ICD-10-CM

## 2014-09-20 DIAGNOSIS — M25511 Pain in right shoulder: Secondary | ICD-10-CM | POA: Diagnosis not present

## 2014-09-20 DIAGNOSIS — G8929 Other chronic pain: Secondary | ICD-10-CM

## 2014-09-20 DIAGNOSIS — M25512 Pain in left shoulder: Principal | ICD-10-CM

## 2014-09-20 NOTE — Patient Instructions (Signed)
We discussed benefits of positive imaging to help ease pain

## 2014-09-20 NOTE — Therapy (Signed)
[place], [place], [address] Phone: [phone]   Fax:  [phone]  Physical Therapy Treatment  Patient Details  Name: Hailey Hopkins MRN: [id number] Date of Birth: March 05, 1976 Referring Provider:  Timmothy Euler, MD  Encounter Date: 09/20/2014      PT End of Session - 09/20/14 0936    Visit Number 9   Number of Visits 16   Date for PT Re-Evaluation 10/15/14   PT Start Time 0845   PT Stop Time 0945   PT Time Calculation (min) 60 min   Activity Tolerance Patient tolerated treatment well   Behavior During Therapy [facility] for tasks assessed/performed      Past Medical History  Diagnosis Date  . Diverticular disease   . Depression     Past Surgical History  Procedure Laterality Date  . Abdominal hysterectomy      There were no vitals filed for this visit.  Visit Diagnosis:  Pain in right shoulder - Plan: PT plan of care cert/re-cert  Stiffness of right shoulder joint - Plan: PT plan of care cert/re-cert  Muscle weakness of right arm - Plan: PT plan of care cert/re-cert      Subjective Assessment - 09/20/14 0859    Subjective MD not sure why contnued pain. She is using crean without benefit. PT has not done anything related to pain. Range has improved. She has been doing cane exercises and band exercises dailey.    Currently in Pain? Yes   Pain Score 6    Pain Location Shoulder   Pain Orientation Left;Anterior  and to upper arm   Pain Descriptors / Indicators Aching   Pain Type Chronic pain   Pain Onset More than a month ago   Pain Frequency Constant   Aggravating Factors  Using arm   Pain Relieving Factors heat but not much else eases pain   Multiple Pain Sites No            [facility] PT Assessment - 09/20/14 0848    AROM   Right Shoulder Extension 35 Degrees   Right Shoulder Flexion 150 Degrees   Right Shoulder ABduction 168 Degrees   Right Shoulder Internal Rotation 58  Degrees   Right Shoulder External Rotation 106 Degrees   Right Shoulder Horizontal ABduction 25 Degrees   Right Shoulder Horizontal  ADduction 110 Degrees   Left Shoulder Extension 38 Degrees   Left Shoulder Flexion 124 Degrees   Left Shoulder ABduction 162 Degrees   Left Shoulder Internal Rotation 73 Degrees   Left Shoulder External Rotation 100 Degrees   Left Shoulder Horizontal ABduction 22 Degrees   Left Shoulder Horizontal ADduction 106 Degrees   PROM   Overall PROM Comments LT shoulder = to Rt.    Strength   Overall Strength Comments 5/5/ RT and LT with some hesitation with LT shoulder abduction and ER.                      [facility] Adult PT Treatment/Exercise - 09/20/14 0903    Moist Heat Therapy   Number Minutes Moist Heat 15 Minutes   Moist Heat Location Shoulder  LT   Electrical Stimulation   Electrical Stimulation Location LT Shoulder   Manual Therapy   Soft tissue mobilization STW to whole shoulder with concentration on anterior shoulder/pectorals. Also PROM all planes x 1,                   PT  Short Term Goals - 08/16/14 1017    PT SHORT TERM GOAL #1   Title She will be independent with inital HEP    Time 3   Period Weeks   Status Achieved   PT SHORT TERM GOAL #2   Title She will be able to lift arm to 110 degrees or better   Time 3   Period Weeks   Status Achieved   PT SHORT TERM GOAL #3   Title she will report pain 30% or more decr to be able to dress with left pain   Time 3   Period Weeks   Status Achieved           PT Long Term Goals - 09/20/14 0940    PT LONG TERM GOAL #1   Title She will be able to do all HEP issued as of lst visit   Status On-going   PT LONG TERM GOAL #2   Title She will report 50% decr pain or more    Status On-going   PT LONG TERM GOAL #3   Title She will be able to dress with min to no pain   Status On-going   PT LONG TERM GOAL #4   Title She will be able to move full range LT shoulder with min  pain.   Status On-going   PT LONG TERM GOAL #5   Title She will be able to lift to waist and carry 20 pounds or more to increase home and work participation   Status On-going               Plan - 09/20/14 8453    Clinical Impression Statement Range and strength improved but pain inchanged per patient. Passive range still normal. Will try to have her schdued with needling but the PT who do this are very busy.    PT Next Visit Plan Continue with predominently STW and modalities and review rockwood for home   PT Home Exercise Plan Review rockwood and isometrics   Consulted and Agree with Plan of Care Patient        Problem List Patient Active Problem List   Diagnosis Date Noted  . Plantar fasciitis 12/02/2013  . Benign essential HTN 05/28/2013  . Hepatic steatosis 04/12/2011  . Back pain 06/16/2010  . HEMORRHOIDS, EXTERNAL 12/21/2009  . DEPRESSION, MAJOR, MODERATE 04/04/2007  . HAIR LOSS 08/23/2006  . OBESITY NOS 05/28/2006  . FATIGUE 05/28/2006  . GLUCOSE INTOLERANCE 05/23/2006  . Migraine 05/23/2006    Darrel Hoover PT 09/20/2014, 9:44 AM  [facility] [address] [place], [place], [address] Phone: [phone]   Fax:  [phone]

## 2014-09-23 ENCOUNTER — Ambulatory Visit: Payer: PRIVATE HEALTH INSURANCE

## 2014-09-29 ENCOUNTER — Ambulatory Visit: Payer: PRIVATE HEALTH INSURANCE | Attending: Family Medicine | Admitting: Physical Therapy

## 2014-09-29 DIAGNOSIS — G8929 Other chronic pain: Secondary | ICD-10-CM | POA: Insufficient documentation

## 2014-09-29 DIAGNOSIS — M25511 Pain in right shoulder: Secondary | ICD-10-CM | POA: Insufficient documentation

## 2014-09-29 DIAGNOSIS — M6281 Muscle weakness (generalized): Secondary | ICD-10-CM | POA: Insufficient documentation

## 2014-09-29 DIAGNOSIS — M25612 Stiffness of left shoulder, not elsewhere classified: Secondary | ICD-10-CM | POA: Insufficient documentation

## 2014-09-29 DIAGNOSIS — M25512 Pain in left shoulder: Secondary | ICD-10-CM | POA: Insufficient documentation

## 2014-09-29 DIAGNOSIS — M25611 Stiffness of right shoulder, not elsewhere classified: Secondary | ICD-10-CM | POA: Insufficient documentation

## 2014-09-29 DIAGNOSIS — R293 Abnormal posture: Secondary | ICD-10-CM | POA: Insufficient documentation

## 2014-10-01 ENCOUNTER — Ambulatory Visit: Payer: PRIVATE HEALTH INSURANCE | Admitting: Physical Therapy

## 2014-10-01 DIAGNOSIS — R293 Abnormal posture: Secondary | ICD-10-CM | POA: Diagnosis present

## 2014-10-01 DIAGNOSIS — M25612 Stiffness of left shoulder, not elsewhere classified: Secondary | ICD-10-CM | POA: Diagnosis present

## 2014-10-01 DIAGNOSIS — M6281 Muscle weakness (generalized): Secondary | ICD-10-CM | POA: Diagnosis present

## 2014-10-01 DIAGNOSIS — M25511 Pain in right shoulder: Secondary | ICD-10-CM

## 2014-10-01 DIAGNOSIS — M25512 Pain in left shoulder: Secondary | ICD-10-CM | POA: Diagnosis not present

## 2014-10-01 DIAGNOSIS — M25611 Stiffness of right shoulder, not elsewhere classified: Secondary | ICD-10-CM

## 2014-10-01 DIAGNOSIS — G8929 Other chronic pain: Secondary | ICD-10-CM | POA: Diagnosis present

## 2014-10-01 NOTE — Therapy (Addendum)
[place] [address] [place], [place], [address] Phone: [phone]   Fax:  [phone]  Physical Therapy Treatment  Patient Details  Name: Hailey Hopkins MRN: [id number] Date of Birth: 11/22/1975 Referring Provider:  Elberta Leatherwood, MD  Encounter Date: 10/01/2014      PT End of Session - 10/01/14 1021    Visit Number 10   Number of Visits 16   Date for PT Re-Evaluation 10/15/14   PT Start Time 0935   PT Stop Time 1031   PT Time Calculation (min) 56 min   Activity Tolerance Patient limited by pain   Behavior During Therapy [facility] for tasks assessed/performed      Past Medical History  Diagnosis Date  . Diverticular disease   . Depression     Past Surgical History  Procedure Laterality Date  . Abdominal hysterectomy      There were no vitals filed for this visit.  Visit Diagnosis:  Chronic pain in left shoulder  Stiffness of left shoulder joint  Muscle weakness of left arm  Pain in right shoulder  Stiffness of right shoulder joint  Muscle weakness of right arm  Abnormal posture      Subjective Assessment - 10/01/14 0939    Subjective took some IBuprofen for shoulder this morning; home exercises have been good, helping a little bit with pain and stiffness; going to the doctor next week because still in pain   Currently in Pain? Yes   Pain Score 5    Pain Location Shoulder   Pain Orientation Left;Anterior;Upper   Pain Descriptors / Indicators Aching  moments of sharp pain occassionally    Pain Relieving Factors heat and shoulder flexion stretch    Multiple Pain Sites No            [facility] PT Assessment - 10/01/14 0001    Observation/Other Assessments   Focus on Therapeutic Outcomes (FOTO)  Improved to 33% limited (initial 61% limited and predicted 35% limited)                     [facility] Adult PT Treatment/Exercise - 10/01/14 0943    Neck Exercises: Machines for Strengthening   UBE (Upper Arm Bike) 5 min; level 1.5; 2.5 min forward with 2.5 min backwards    Shoulder Exercises: Standing   Other Standing Exercises rockwood x20 each with red band    Shoulder Exercises: Pulleys   Flexion 2 minutes  she is very cautious   ABduction 2 minutes   Shoulder Exercises: ROM/Strengthening   Other ROM/Strengthening Exercises wall slides in flexion x 15   Manual Therapy   Manual Therapy Soft tissue mobilization   Soft tissue mobilization STW to shouler with focus on posterior and superior portions (supra and trap)                  PT Short Term Goals - 08/16/14 1017    PT SHORT TERM GOAL #1   Title She will be independent with inital HEP    Time 3   Period Weeks   Status Achieved   PT SHORT TERM GOAL #2   Title She will be able to lift arm to 110 degrees or better   Time 3   Period Weeks   Status Achieved   PT SHORT TERM GOAL #3   Title she will report pain 30% or more decr to be able to dress with left pain   Time 3   Period  Weeks   Status Achieved           PT Long Term Goals - 10/01/14 1024    PT LONG TERM GOAL #1   Title She will be able to do all HEP issued as of lst visit   Time 6   Period Weeks   Status Achieved   PT LONG TERM GOAL #2   Title She will report 50% decr pain or more    Time 6   Period Weeks   Status Achieved   PT LONG TERM GOAL #3   Title She will be able to dress with min to no pain  down to a 3/10    Time 6   Period Weeks   Status Partially Met   PT LONG TERM GOAL #4   Title She will be able to move full range LT shoulder with min pain.  some days it still hurts but most days its ok   Time 6   Period Weeks   Status Partially Met   PT LONG TERM GOAL #5   Title She will be able to lift to waist and carry 20 pounds or more to increase home and work participation   Time 6   Period Weeks   Status On-going               Plan - 10/01/14 1151    Clinical Impression Statement pain is getting slightly  better with activities but still there; pt. I with rockwood and has met LTG #2 and partially met LTG #3 and #4; STW found TrP in supra, trap/rhomb and pec    PT Next Visit Plan DN STW and modalities, lifting at waist level 20#, wall slides in flexion/abduction     During this treatment session, the therapist was present, participating in and directing the treatment.   Problem List Patient Active Problem List   Diagnosis Date Noted  . Plantar fasciitis 12/02/2013  . Benign essential HTN 05/28/2013  . Hepatic steatosis 04/12/2011  . Back pain 06/16/2010  . HEMORRHOIDS, EXTERNAL 12/21/2009  . DEPRESSION, MAJOR, MODERATE 04/04/2007  . HAIR LOSS 08/23/2006  . OBESITY NOS 05/28/2006  . FATIGUE 05/28/2006  . GLUCOSE INTOLERANCE 05/23/2006  . Migraine 05/23/2006     Denna Haggard, SPTA  10/01/2014 12:01 PM  Phone: [phone]  Fax: [phone]  Hessie Diener, PTA 10/01/2014 12:01 PM Phone: [phone] Fax: [facility] [place], [place], [address] Phone: [phone]   Fax:  [phone]

## 2014-10-05 ENCOUNTER — Ambulatory Visit: Payer: PRIVATE HEALTH INSURANCE | Admitting: Physical Therapy

## 2014-10-05 DIAGNOSIS — M25612 Stiffness of left shoulder, not elsewhere classified: Secondary | ICD-10-CM

## 2014-10-05 DIAGNOSIS — G8929 Other chronic pain: Secondary | ICD-10-CM

## 2014-10-05 DIAGNOSIS — M25512 Pain in left shoulder: Principal | ICD-10-CM

## 2014-10-05 DIAGNOSIS — M6281 Muscle weakness (generalized): Secondary | ICD-10-CM

## 2014-10-05 NOTE — Therapy (Signed)
[place], [place], [address] Phone: [phone]   Fax:  [phone]  Physical Therapy Treatment  Patient Details  Name: Hailey Hopkins MRN: [id number] Date of Birth: 08/30/1975 Referring Provider:  Elberta Leatherwood, MD  Encounter Date: 10/05/2014      PT End of Session - 10/05/14 1650    Visit Number 11   Number of Visits 16   Date for PT Re-Evaluation 10/15/14   PT Start Time 8756   PT Stop Time 0900   PT Time Calculation (min) 65 min   Activity Tolerance Patient tolerated treatment well      Past Medical History  Diagnosis Date  . Diverticular disease   . Depression     Past Surgical History  Procedure Laterality Date  . Abdominal hysterectomy      There were no vitals filed for this visit.  Visit Diagnosis:  Chronic pain in left shoulder  Stiffness of left shoulder joint  Muscle weakness of left arm      Subjective Assessment - 10/05/14 0754    Subjective Just getting off work after 11 hour shift.  I've the ROM but it still hurts.  Spasms in left upper trap region.  States the exercises sometimes aggravate but elevating arm overhead can alleviate the pain and also IR behind back.     Currently in Pain? Yes   Pain Score 4    Pain Location Shoulder   Pain Orientation Left   Pain Type Chronic pain   Pain Onset More than a month ago   Aggravating Factors  general UE use   Pain Relieving Factors resting arm on wall overhead or behind back                         [facility] Adult PT Treatment/Exercise - 10/05/14 0001    Shoulder Exercises: Prone   Extension Strengthening;Left;10 reps   Horizontal ABduction 1 Strengthening;Left;10 reps   Horizontal ABduction 2 Strengthening;Left;10 reps  100 degrees thumb up   Shoulder Exercises: Stretch   Table Stretch - Abduction 2 reps;20 seconds  doorway rhomboid, lat stretch   Other Shoulder Stretches barrel hug 3x 20 sec   Moist Heat Therapy   Number Minutes Moist Heat 10 Minutes   Moist Heat Location Shoulder   Manual Therapy   Manual Therapy Soft tissue mobilization;Myofascial release;Manual Traction;Scapular mobilization   Soft tissue mobilization left upper trap, levator scap,rhomboids, subscapularis   Myofascial Release contract/relax upper traps 5x 5 sec; pectoral MFR   Scapular Mobilization scapular distraction, medial and lateral glides 10x   Manual Traction cervical 3x 20 sec          Trigger Point Dry Needling - 10/05/14 1648    Consent Given? Yes   Education Handout Provided No  given last visit   Muscles Treated Upper Body Upper trapezius;Levator scapulae;Rhomboids;Subscapularis;Infraspinatus   Upper Trapezius Response Twitch reponse elicited;Palpable increased muscle length   Levator Scapulae Response Twitch response elicited;Palpable increased muscle length   Rhomboids Response Palpable increased muscle length   Infraspinatus Response Palpable increased muscle length   Subscapularis Response Palpable increased muscle length      Left side only        PT Education - 10/05/14 1650    Education provided Yes   Education Details Doorway rhomboid stretch; barrel stretch   Person(s) Educated Patient   Methods Explanation;Demonstration   Comprehension Verbalized understanding;Returned demonstration  PT Short Term Goals - 10/05/14 1659    PT SHORT TERM GOAL #1   Title She will be independent with inital HEP    Status Achieved   PT SHORT TERM GOAL #2   Title She will be able to lift arm to 110 degrees or better   Status Achieved   PT SHORT TERM GOAL #3   Title she will report pain 30% or more decr to be able to dress with left pain   Status Achieved           PT Long Term Goals - 10/05/14 1659    PT LONG TERM GOAL #1   Title She will be able to do all HEP issued as of lst visit   Status Achieved   PT LONG TERM GOAL #2   Title She will report 50% decr pain or  more    Status Achieved   PT LONG TERM GOAL #3   Title She will be able to dress with min to no pain   Time 6   Period Weeks   Status Partially Met   PT LONG TERM GOAL #4   Title She will be able to move full range LT shoulder with min pain.   Time 6   Period Weeks   Status Partially Met   PT LONG TERM GOAL #5   Title She will be able to lift to waist and carry 20 pounds or more to increase home and work participation   Time 6   Period Weeks   Status On-going               Plan - 10/05/14 1652    Clinical Impression Statement The patient reports she feels stronger but her left shoulder pain continues with recent increase in spasms in left upper trap the last few days.  Dry needling intervention performed today with manual therapy.  Improved muscle length following with stretching exercises.  Assess response next visit (visit # 12 approved by [facility]).  If beneficial, will request additional visits from [facility] adjuster additional 4-5 visits.     PT Next Visit Plan Assess response to dry needling today;  next visit is last approved by [facility] so far; if dry needling helpful may need additional visits to address further myofascial issues especially pectorals;  ?clavicular, first rib mobs;  soft tissue work; periscapular strengthening        Problem List Patient Active Problem List   Diagnosis Date Noted  . Plantar fasciitis 12/02/2013  . Benign essential HTN 05/28/2013  . Hepatic steatosis 04/12/2011  . Back pain 06/16/2010  . HEMORRHOIDS, EXTERNAL 12/21/2009  . DEPRESSION, MAJOR, MODERATE 04/04/2007  . HAIR LOSS 08/23/2006  . OBESITY NOS 05/28/2006  . FATIGUE 05/28/2006  . GLUCOSE INTOLERANCE 05/23/2006  . Migraine 05/23/2006    Alvera Singh 10/05/2014, 5:01 PM  [facility] [address] [place], [place], [address] Phone: [phone]   Fax:  [phone]   Ruben Im, PT 10/05/2014 5:02 PM Phone: [phone] Fax:  [phone]

## 2014-10-05 NOTE — Patient Instructions (Addendum)
Wall stretch rhomboids/lats 3x 20 sec  Barrel stretch 3x 20 sec  Prone Is, Ys, Ts 10--15 x 1x/day

## 2014-10-07 ENCOUNTER — Ambulatory Visit: Payer: PRIVATE HEALTH INSURANCE | Admitting: Physical Therapy

## 2014-10-07 DIAGNOSIS — M25612 Stiffness of left shoulder, not elsewhere classified: Secondary | ICD-10-CM

## 2014-10-07 DIAGNOSIS — R293 Abnormal posture: Secondary | ICD-10-CM

## 2014-10-07 DIAGNOSIS — G8929 Other chronic pain: Secondary | ICD-10-CM

## 2014-10-07 DIAGNOSIS — M25512 Pain in left shoulder: Secondary | ICD-10-CM | POA: Diagnosis not present

## 2014-10-07 DIAGNOSIS — M25611 Stiffness of right shoulder, not elsewhere classified: Secondary | ICD-10-CM

## 2014-10-07 DIAGNOSIS — M6281 Muscle weakness (generalized): Secondary | ICD-10-CM

## 2014-10-07 NOTE — Patient Instructions (Signed)
Over Head Pull: Narrow Grip  And wide grip     On back, knees bent, feet flat, band across thighs, elbows straight but relaxed. Pull hands apart (start). Keeping elbows straight, bring arms up and over head, hands toward floor. Keep pull steady on band. Hold momentarily. Return slowly, keeping pull steady, back to start. Repeat 10-30 ___ times. Band color Yellow______   Side Pull: Double Arm   On back, knees bent, feet flat. Arms perpendicular to body, shoulder level, elbows straight but relaxed. Pull arms out to sides, elbows straight. Resistance band comes across collarbones, hands toward floor. Hold momentarily. Slowly return to starting position. Repeat 10-30___ times. Band color __yellow___   Sash   On back, knees bent, feet flat, left hand on left hip, right hand above left. Pull right arm DIAGONALLY (hip to shoulder) across chest. Bring right arm along head toward floor. Hold momentarily. Slowly return to starting position. Repeat _10-30__ times. Do with left arm. Band color _Yellow_____   Shoulder Rotation: Double Arm   On back, knees bent, feet flat, elbows tucked at sides, bent 90, hands palms up. Pull hands apart and down toward floor, keeping elbows near sides. Hold momentarily. Slowly return to starting position. Repeat 10- 30___ times. Band color ___Yellow___

## 2014-10-07 NOTE — Therapy (Signed)
[place], [place], [address] Phone: [phone]   Fax:  [phone]  Physical Therapy Treatment  Patient Details  Name: Hailey Hopkins MRN: [id number] Date of Birth: May 12, 1975 Referring Provider:  Elberta Leatherwood, MD  Encounter Date: 10/07/2014      PT End of Session - 10/07/14 0932    Visit Number 12   Number of Visits 16   Date for PT Re-Evaluation 10/15/14   PT Start Time 0846   PT Stop Time 0932   PT Time Calculation (min) 46 min   Activity Tolerance Patient tolerated treatment well   Behavior During Therapy [facility] for tasks assessed/performed      Past Medical History  Diagnosis Date  . Diverticular disease   . Depression     Past Surgical History  Procedure Laterality Date  . Abdominal hysterectomy      There were no vitals filed for this visit.  Visit Diagnosis:  Chronic pain in left shoulder  Stiffness of left shoulder joint  Muscle weakness of left arm  Stiffness of right shoulder joint  Muscle weakness of right arm  Abnormal posture      Subjective Assessment - 10/07/14 0851    Subjective (p) Dry Needle loosened it a little. Had a cortizone shot in shoulder yesterday.  See's MD again in 3 weeks.   Currently in Pain? (p) Yes   Pain Score (p) 3    Pain Orientation (p) Left;Anterior;Mid;Posterior   Pain Descriptors / Indicators (p) Aching  sore from injection   Pain Radiating Towards (p) N/A   Aggravating Factors  (p) injection                         [facility] Adult PT Treatment/Exercise - 10/07/14 0001    Shoulder Exercises: Supine   Other Supine Exercises scapular srabilization series yellow band.  flexion wide, narrow, ER sash, Horizontal abduction 10 reps.  feels it anterior chest 1/10   Manual Therapy   Soft tissue mobilization Lt shouler, peri scapular deltois soft tissue work .  Care taken to avoid injecton site.  No pain post manual.                 PT Education - 10/07/14 0931    Education provided Yes   Education Details scapular band series .   Person(s) Educated Patient   Methods Explanation;Demonstration;Tactile cues;Handout   Comprehension Verbalized understanding;Returned demonstration          PT Short Term Goals - 10/05/14 1659    PT SHORT TERM GOAL #1   Title She will be independent with inital HEP    Status Achieved   PT SHORT TERM GOAL #2   Title She will be able to lift arm to 110 degrees or better   Status Achieved   PT SHORT TERM GOAL #3   Title she will report pain 30% or more decr to be able to dress with left pain   Status Achieved           PT Long Term Goals - 10/05/14 1659    PT LONG TERM GOAL #1   Title She will be able to do all HEP issued as of lst visit   Status Achieved   PT LONG TERM GOAL #2   Title She will report 50% decr pain or more    Status Achieved   PT LONG TERM GOAL #3   Title She will  be able to dress with min to no pain   Time 6   Period Weeks   Status Partially Met   PT LONG TERM GOAL #4   Title She will be able to move full range LT shoulder with min pain.   Time 6   Period Weeks   Status Partially Met   PT LONG TERM GOAL #5   Title She will be able to lift to waist and carry 20 pounds or more to increase home and work participation   Time 6   Period Weeks   Status On-going               Plan - 10/07/14 0933    Clinical Impression Statement Manual and exercise peri scapular focus.  Patient thought MD thought it was a disc? but he injected her shoulder.  Progress toward Home exercise goals.  1/10 pain post exercise Supine flexion WNL with band.   PT Next Visit Plan DN. rewiew exercises   PT Home Exercise Plan Band scapular   Consulted and Agree with Plan of Care Patient        Problem List Patient Active Problem List   Diagnosis Date Noted  . Plantar fasciitis 12/02/2013  . Benign essential HTN 05/28/2013  . Hepatic steatosis  04/12/2011  . Back pain 06/16/2010  . HEMORRHOIDS, EXTERNAL 12/21/2009  . DEPRESSION, MAJOR, MODERATE 04/04/2007  . HAIR LOSS 08/23/2006  . OBESITY NOS 05/28/2006  . FATIGUE 05/28/2006  . GLUCOSE INTOLERANCE 05/23/2006  . Migraine 05/23/2006    [facility] 10/07/2014, 9:37 AM  [facility] [address] [place], [place], [address] Phone: [phone]   Fax:  [phone]     Melvenia Needles, PTA 10/07/2014 9:37 AM Phone: [phone] Fax: [phone]

## 2014-10-12 ENCOUNTER — Encounter: Payer: Self-pay | Admitting: Physical Therapy

## 2014-10-12 ENCOUNTER — Ambulatory Visit: Payer: PRIVATE HEALTH INSURANCE | Admitting: Physical Therapy

## 2014-10-12 DIAGNOSIS — M25612 Stiffness of left shoulder, not elsewhere classified: Secondary | ICD-10-CM

## 2014-10-12 DIAGNOSIS — M6281 Muscle weakness (generalized): Secondary | ICD-10-CM

## 2014-10-12 DIAGNOSIS — M25512 Pain in left shoulder: Secondary | ICD-10-CM | POA: Diagnosis not present

## 2014-10-12 DIAGNOSIS — G8929 Other chronic pain: Secondary | ICD-10-CM

## 2014-10-12 DIAGNOSIS — R293 Abnormal posture: Secondary | ICD-10-CM

## 2014-10-12 DIAGNOSIS — M25511 Pain in right shoulder: Secondary | ICD-10-CM

## 2014-10-12 DIAGNOSIS — M25611 Stiffness of right shoulder, not elsewhere classified: Secondary | ICD-10-CM

## 2014-10-12 NOTE — Therapy (Signed)
[place], [place], [address] Phone: [phone]   Fax:  [phone]  Physical Therapy Treatment  Patient Details  Name: Hailey Hopkins MRN: [id number] Date of Birth: May 25, 1975 Referring Provider:  Elberta Leatherwood, MD  Encounter Date: 10/12/2014      PT End of Session - 10/12/14 0838    Visit Number 13   Number of Visits 16   Date for PT Re-Evaluation 10/15/14   PT Start Time 0800   PT Stop Time 0900   PT Time Calculation (min) 60 min   Activity Tolerance Patient tolerated treatment well   Behavior During Therapy [facility] for tasks assessed/performed      Past Medical History  Diagnosis Date  . Diverticular disease   . Depression     Past Surgical History  Procedure Laterality Date  . Abdominal hysterectomy      There were no vitals filed for this visit.  Visit Diagnosis:  Chronic pain in left shoulder  Stiffness of left shoulder joint  Muscle weakness of left arm  Stiffness of right shoulder joint  Muscle weakness of right arm  Abnormal posture  Pain in right shoulder      Subjective Assessment - 10/12/14 0800    Subjective I dont have to work today. I am not too bad today.  I recieved an injecton last Wednesday.  I dont have as much pain with the injection but I am more nagging pain   Pertinent History She will have an MRI 08/02/14. Ortho Md says may nbe related to bicep issue so will have MRIShe report stretching at hoem reaching behind back and over head.    Diagnostic tests x ray : negative   Patient Stated Goals To be able to use Lt arm for work and home tasks   Currently in Pain? Yes   Pain Score 1    Pain Location Shoulder   Pain Orientation Left   Pain Descriptors / Indicators Aching   Pain Type Chronic pain   Pain Onset More than a month ago   Pain Frequency Intermittent            [facility] PT Assessment - 10/12/14 0837    AROM   Left Shoulder Extension 42 Degrees    Left Shoulder Flexion 148 Degrees  minimal discomfort pain post treatment   Left Shoulder ABduction 156 Degrees   Left Shoulder Internal Rotation 73 Degrees   Left Shoulder External Rotation 115 Degrees   Left Shoulder Horizontal ABduction 40 Degrees   Left Shoulder Horizontal ADduction 106 Degrees                     [facility] Adult PT Treatment/Exercise - 10/12/14 0804    Shoulder Exercises: Supine   Other Supine Exercises scapular srabilization series red band.  flexion wide, narrow, ER sash, Horizontal abduction 10 reps.  no pain in ant chest    Moist Heat Therapy   Number Minutes Moist Heat 15 Minutes   Moist Heat Location Shoulder   Manual Therapy   Manual Therapy Soft tissue mobilization;Myofascial release;Scapular mobilization   Soft tissue mobilization Lt shouler, peri scapular deltoid soft tissue work .   No pain post manual.   Myofascial Release contract/relax upper traps 5x 5 sec; pectoral MFR   Scapular Mobilization scapular distraction, medial and lateral glides 10x   Passive ROM for stretching of lats on left side   Manual Traction --  Trigger Point Dry Needling - 10/12/14 9233    Consent Given? Yes   Education Handout Provided --  no given previously    Muscles Treated Upper Body Upper trapezius;Levator scapulae;Pectoralis major;Infraspinatus;Supraspinatus;Subscapularis  left side only for DN, also lats on left side c lengthening   Upper Trapezius Response Twitch reponse elicited;Palpable increased muscle length   Pectoralis Major Response Palpable increased muscle length  r   Levator Scapulae Response Twitch response elicited;Palpable increased muscle length   Supraspinatus Response Palpable increased muscle length              PT Education - 10/12/14 0838    Education provided Yes   Education Details reviewed scapular series with red T band. No pain with movement.   Person(s) Educated Patient   Methods  Explanation;Demonstration;Verbal cues   Comprehension Verbalized understanding;Returned demonstration          PT Short Term Goals - 10/05/14 1659    PT SHORT TERM GOAL #1   Title She will be independent with inital HEP    Status Achieved   PT SHORT TERM GOAL #2   Title She will be able to lift arm to 110 degrees or better   Status Achieved   PT SHORT TERM GOAL #3   Title she will report pain 30% or more decr to be able to dress with left pain   Status Achieved           PT Long Term Goals - 10/05/14 1659    PT LONG TERM GOAL #1   Title She will be able to do all HEP issued as of lst visit   Status Achieved   PT LONG TERM GOAL #2   Title She will report 50% decr pain or more    Status Achieved   PT LONG TERM GOAL #3   Title She will be able to dress with min to no pain   Time 6   Period Weeks   Status Partially Met   PT LONG TERM GOAL #4   Title She will be able to move full range LT shoulder with min pain.   Time 6   Period Weeks   Status Partially Met   PT LONG TERM GOAL #5   Title She will be able to lift to waist and carry 20 pounds or more to increase home and work participation   Time 6   Period Weeks   Status On-going               Plan - 10/12/14 0840    Clinical Impression Statement Dry needle of Left pectoral muscles and teres major , lats on left ,and upper trap and levator and supraspinatues followed by manual and review of supine sub scap mx. with red t band. Improvement in pain 1/10 and inproved AROM left shld ext 42, flex 148, abd 156, ER 115 horiz abd 40   Pt will benefit from skilled therapeutic intervention in order to improve on the following deficits Impaired UE functional use;Pain;Decreased strength;Decreased range of motion   Rehab Potential Good   PT Frequency 2x / week   PT Duration 6 weeks   PT Treatment/Interventions Therapeutic exercise;Patient/family education;Manual techniques;Passive range of  motion;Cryotherapy;Ultrasound;Moist Heat;Electrical Stimulation;Dry needling   PT Next Visit Plan Assess goals and progress next visit and need for additional dry needling.  May be able to move on to only exercise if pain resolved and maximize function with HEP   Consulted and Agree with Plan of Care  Patient        Problem List Patient Active Problem List   Diagnosis Date Noted  . Plantar fasciitis 12/02/2013  . Benign essential HTN 05/28/2013  . Hepatic steatosis 04/12/2011  . Back pain 06/16/2010  . HEMORRHOIDS, EXTERNAL 12/21/2009  . DEPRESSION, MAJOR, MODERATE 04/04/2007  . HAIR LOSS 08/23/2006  . OBESITY NOS 05/28/2006  . FATIGUE 05/28/2006  . GLUCOSE INTOLERANCE 05/23/2006  . Migraine 05/23/2006    Voncille Lo, PT 10/12/2014 12:56 PM Phone: [phone] Fax: [facility] [place], [place], [address] Phone: [phone]   Fax:  [phone]

## 2014-10-14 ENCOUNTER — Ambulatory Visit: Payer: PRIVATE HEALTH INSURANCE | Admitting: Physical Therapy

## 2014-10-14 DIAGNOSIS — M25611 Stiffness of right shoulder, not elsewhere classified: Secondary | ICD-10-CM

## 2014-10-14 DIAGNOSIS — M25512 Pain in left shoulder: Secondary | ICD-10-CM | POA: Diagnosis not present

## 2014-10-14 DIAGNOSIS — M6281 Muscle weakness (generalized): Secondary | ICD-10-CM

## 2014-10-14 DIAGNOSIS — M25612 Stiffness of left shoulder, not elsewhere classified: Secondary | ICD-10-CM

## 2014-10-14 DIAGNOSIS — G8929 Other chronic pain: Secondary | ICD-10-CM

## 2014-10-14 NOTE — Therapy (Signed)
[place], [place], [address] Phone: [phone]   Fax:  [phone]  Physical Therapy Treatment  Patient Details  Name: Hailey Hopkins MRN: [id number] Date of Birth: 04/03/1975 Referring Provider:  Elberta Leatherwood, MD  Encounter Date: 10/14/2014      PT End of Session - 10/14/14 0930    Visit Number 14   Number of Visits 16   Date for PT Re-Evaluation 10/15/14   Activity Tolerance Patient tolerated treatment well   Behavior During Therapy [facility] for tasks assessed/performed      Past Medical History  Diagnosis Date  . Diverticular disease   . Depression     Past Surgical History  Procedure Laterality Date  . Abdominal hysterectomy      There were no vitals filed for this visit.  Visit Diagnosis:  Chronic pain in left shoulder  Stiffness of left shoulder joint  Muscle weakness of left arm  Stiffness of right shoulder joint      Subjective Assessment - 10/14/14 0848    Subjective Worked last night.  2-3/10 now along collar bone .. Sore.  Was able to keep up with her stretches.  See's MD on the 10th.  DN really helped in the muscles.  Pain still there and is consistant with movement.   Currently in Pain? Yes   Pain Score 3    Pain Location Shoulder   Pain Orientation Left   Pain Descriptors / Indicators Aching  catches   Pain Type Chronic pain   Aggravating Factors  catches whn she changes clothes.   Pain Relieving Factors cortizone shot.  DN    Multiple Pain Sites No                         [facility] Adult PT Treatment/Exercise - 10/14/14 0853    Shoulder Exercises: Seated   Flexion AROM  144 degrees   Flexion Limitations 1   Shoulder Exercises: Prone   Extension Strengthening  3 LBS, 10 reps elbow flexed, straight, leaning over counter   Shoulder Exercises: Sidelying   Other Sidelying Exercises Abduction, flexion, ER AROM 10 reps each.   Shoulder Exercises:  Standing   Horizontal ABduction 10 reps   Theraband Level (Shoulder Horizontal ABduction) Level 1 (Yellow)  leaning on wall   Shoulder Exercises: ROM/Strengthening   UBE (Upper Arm Bike) 6.5 minutes L2 forward reverse 3 minutes each.   Other ROM/Strengthening Exercises cable cross 7 LBS punch down with triceps 2 sets 10.   Other ROM/Strengthening Exercises Cable cross flexion to 90 with 3 LBS 10 reps 1 set.   Cryotherapy   Number Minutes Cryotherapy 10 Minutes   Cryotherapy Location Shoulder;Lumbar Spine  Lt    Type of Cryotherapy --  cold pack                  PT Short Term Goals - 10/05/14 1659    PT SHORT TERM GOAL #1   Title She will be independent with inital HEP    Status Achieved   PT SHORT TERM GOAL #2   Title She will be able to lift arm to 110 degrees or better   Status Achieved   PT SHORT TERM GOAL #3   Title she will report pain 30% or more decr to be able to dress with left pain   Status Achieved           PT Long Term Goals -  10/14/14 0933    PT LONG TERM GOAL #1   Title She will be able to do all HEP issued as of lst visit   Time 6   Period Weeks   Status Achieved   PT LONG TERM GOAL #2   Title She will report 50% decr pain or more    Time 6   Period Weeks   Status Achieved   PT LONG TERM GOAL #3   Title She will be able to dress with min to no pain   Baseline still painful more than minimal if it pops.   Time 6   Period Weeks   Status Partially Met   PT LONG TERM GOAL #4   Title She will be able to move full range LT shoulder with min pain.   Baseline minimal pain with full ROM   Time 6   Period Weeks   Status Achieved   PT LONG TERM GOAL #5   Title She will be able to lift to waist and carry 20 pounds or more to increase home and work participation   Baseline able to do in clinic    Time 6   Period Weeks   Status Achieved               Plan - 10/14/14 0930    Clinical Impression Statement Mild pain post exercise. same  as when she started session.  LTG#4 and #5 met.  POC 10/15/14.  We have asked for more visits from workman's comp    PT Next Visit Plan continue exercise focus, ball on wall,  wall push ups    Consulted and Agree with Plan of Care Patient        Problem List Patient Active Problem List   Diagnosis Date Noted  . Plantar fasciitis 12/02/2013  . Benign essential HTN 05/28/2013  . Hepatic steatosis 04/12/2011  . Back pain 06/16/2010  . HEMORRHOIDS, EXTERNAL 12/21/2009  . DEPRESSION, MAJOR, MODERATE 04/04/2007  . HAIR LOSS 08/23/2006  . OBESITY NOS 05/28/2006  . FATIGUE 05/28/2006  . GLUCOSE INTOLERANCE 05/23/2006  . Migraine 05/23/2006    [facility] 10/14/2014, 9:37 AM  [facility] [address] [place], [place], [address] Phone: [phone]   Fax:  [phone]     Melvenia Needles, PTA 10/14/2014 9:37 AM Phone: [phone] Fax: [phone]

## 2014-10-19 ENCOUNTER — Ambulatory Visit: Payer: PRIVATE HEALTH INSURANCE | Admitting: Physical Therapy

## 2014-10-19 DIAGNOSIS — M25511 Pain in right shoulder: Secondary | ICD-10-CM

## 2014-10-19 DIAGNOSIS — R293 Abnormal posture: Secondary | ICD-10-CM

## 2014-10-19 DIAGNOSIS — M25512 Pain in left shoulder: Principal | ICD-10-CM

## 2014-10-19 DIAGNOSIS — M6281 Muscle weakness (generalized): Secondary | ICD-10-CM

## 2014-10-19 DIAGNOSIS — G8929 Other chronic pain: Secondary | ICD-10-CM

## 2014-10-19 DIAGNOSIS — M25611 Stiffness of right shoulder, not elsewhere classified: Secondary | ICD-10-CM

## 2014-10-19 DIAGNOSIS — M25612 Stiffness of left shoulder, not elsewhere classified: Secondary | ICD-10-CM

## 2014-10-19 NOTE — Therapy (Signed)
Hailey Hopkins, [place], [address] Phone: [phone]   Fax:  [phone]  Physical Therapy Treatment  Patient Details  Name: Hailey Hopkins MRN: [id number] Date of Birth: 1976-02-02 Referring Provider:  Elberta Leatherwood, MD  Encounter Date: 10/19/2014      PT End of Session - 10/19/14 1746    Visit Number 15   Number of Visits 22   Date for PT Re-Evaluation 11/30/14   PT Start Time 0845   PT Stop Time 0944   PT Time Calculation (min) 59 min   Activity Tolerance Patient tolerated treatment well   Behavior During Therapy [facility] for tasks assessed/performed      Past Medical History  Diagnosis Date  . Diverticular disease   . Depression     Past Surgical History  Procedure Laterality Date  . Abdominal hysterectomy      There were no vitals filed for this visit.  Visit Diagnosis:  Chronic pain in left shoulder  Stiffness of left shoulder joint  Muscle weakness of left arm  Stiffness of right shoulder joint  Muscle weakness of right arm  Abnormal posture  Pain in right shoulder      Subjective Assessment - 10/19/14 0851    Subjective worked last night.  Pt points to left upper trap and left pec minor area for pain   Currently in Pain? Yes   Pain Score 4    Pain Location Shoulder   Pain Orientation Left   Pain Descriptors / Indicators Aching   Pain Type Chronic pain   Pain Onset More than a month ago   Multiple Pain Sites Yes   Pain Score 7   Pain Location Chest  left clavicle    Pain Orientation Left   Pain Descriptors / Indicators Sharp;Penetrating   Pain Type Chronic pain   Pain Onset More than a month ago   Pain Frequency Intermittent   Aggravating Factors  especially last 20 degrees of flexion   Pain Relieving Factors not lifting flexing arm to end range            [facility] PT Assessment - 10/19/14 0853    Observation/Other Assessments   Focus on Therapeutic Outcomes  (FOTO)  intake 78% limitation 22% predicted 35%   AROM   Left Shoulder Extension 42 Degrees   Left Shoulder Flexion 152 Degrees  end range pain in shoulder   Left Shoulder ABduction 159 Degrees   Left Shoulder Internal Rotation 73 Degrees   Left Shoulder External Rotation 115 Degrees   Left Shoulder Horizontal ABduction 41 Degrees   Left Shoulder Horizontal ADduction 106 Degrees   Strength   Overall Strength Comments Pt with 4/5  UE flexion and abd but pain illicited with resistance up to 8/10   Palpation   Palpation comment Pt with slightly elevated sternal end of clavicle/ pec major painful to palpation on left                     [facility] Adult PT Treatment/Exercise - 10/19/14 0911    Shoulder Exercises: Supine   Other Supine Exercises     Moist Heat Therapy   Number Minutes Moist Heat 12 Minutes   Moist Heat Location Shoulder  left clavicle   Ultrasound   Ultrasound Location left clavicular attachments   Ultrasound Parameters 50% 1.0 w/cm2 for 8 minutes   Ultrasound Goals Pain   Manual Therapy   Manual Therapy Soft tissue mobilization;Myofascial release;Scapular mobilization;Joint  mobilization   Joint Mobilization superior grade 3 mob of prox end of clavicle   Soft tissue mobilization soft tissue around clavicular insertions, upper trap and levator on Left   Myofascial Release contract/relax upper traps 5x 5 sec; pectoral MFR   Scapular Mobilization scapular distraction, medial and lateral glides 10x   Passive ROM for stretching of lats on left side          Trigger Point Dry Needling - 10/19/14 0853    Consent Given? Yes   Education Handout Provided No  previously given   Muscles Treated Upper Body Upper trapezius;Levator scapulae;Pectoralis minor;Pectoralis major  left side only DN clavicle attachment pec minor twitch   Upper Trapezius Response Twitch reponse elicited;Palpable increased muscle length   Pectoralis Major Response Palpable increased muscle  length   Levator Scapulae Response Twitch response elicited;Palpable increased muscle length     left side only         PT Education - 10/19/14 1745    Education provided Yes   Education Details Neuroscience of pain education.  Pain relieving techniques   Person(s) Educated Patient   Methods Explanation   Comprehension Verbalized understanding          PT Short Term Goals - 10/19/14 1749    PT SHORT TERM GOAL #1   Title She will be independent with inital HEP    Time 3   Period Weeks   Status Achieved   PT SHORT TERM GOAL #2   Title She will be able to lift arm to 110 degrees or better   Time 3   Period Weeks   Status Achieved   PT SHORT TERM GOAL #3   Title she will report pain 30% or more decr to be able to dress with left pain   Time 3   Period Weeks   Status Achieved           PT Long Term Goals - 10/19/14 1749    PT LONG TERM GOAL #1   Title She will be able to do all HEP issued as of lst visit   Time 6   Period Weeks   Status Achieved   PT LONG TERM GOAL #2   Title She will report 50% decr pain or more    Time 6   Period Weeks   Status Achieved   PT LONG TERM GOAL #3   Title She will be able to dress with min to no pain   Baseline still painful more than minimal if it pops./ pt having increased clavicular/pain at pectoralis minor   Time 6   Period Weeks   Status On-going   PT LONG TERM GOAL #4   Title She will be able to move full range LT shoulder with min pain.   Baseline Pain with endrange left shoulder flexion   Time 6   Period Weeks   Status On-going   PT LONG TERM GOAL #5   Title She will be able to lift to waist and carry 20 pounds or more to increase home and work participation   Baseline able to do in clinic    Time 6   Period Weeks   Status Achieved   Additional Long Term Goals   Additional Long Term Goals Yes   PT LONG TERM GOAL #6   Title Pt will be able to tolerate left shoulder flexion 150 to 160 with 2/10 pain or less  with a gallon of milk   Time 6  Period Weeks   Status New               Plan - 10/19/14 1757    Clinical Impression Statement Pt with 5/10 pain in left shoulder partially improved over deltoid but still with residual pain over left proximal clavicle and muscular insertions to clavicle especially pec/major. Pt compensates by elevating upper trap and levator on left and has spasms in same muscle.  Pt with improved left shoulder flex to 152 and abd to 159 but painful on end.  Pt  would benefit from  1 x q week for 6 weeks to maximize pain relief through dry needling and modalities and to improve strengthening without illiciting pain. with progressive strengthening program that she has been unable to complete due to pain.at end range.   Rehab Potential Good   PT Frequency 1x / week   PT Duration 6 weeks   PT Treatment/Interventions Therapeutic exercise;Patient/family education;Manual techniques;Passive range of motion;Cryotherapy;Ultrasound;Moist Heat;Electrical Stimulation;Dry needling   PT Next Visit Plan continue exercise focus, ball on wall,  wall push ups    Consulted and Agree with Plan of Care Patient        Problem List Patient Active Problem List   Diagnosis Date Noted  . Plantar fasciitis 12/02/2013  . Benign essential HTN 05/28/2013  . Hepatic steatosis 04/12/2011  . Back pain 06/16/2010  . HEMORRHOIDS, EXTERNAL 12/21/2009  . DEPRESSION, MAJOR, MODERATE 04/04/2007  . HAIR LOSS 08/23/2006  . OBESITY NOS 05/28/2006  . FATIGUE 05/28/2006  . GLUCOSE INTOLERANCE 05/23/2006  . Migraine 05/23/2006    Voncille Lo, PT 10/19/2014 6:11 PM Phone: [phone] Fax: [phone]  By signing I understand that I am ordering/authorizing the use of Iontophoresis using 4 mg/mL of dexamethasone as a component of this plan of care.  [place], [place], [address] Phone: [phone]   Fax:   [phone]

## 2014-10-21 ENCOUNTER — Telehealth: Payer: Self-pay | Admitting: Physical Therapy

## 2014-10-21 NOTE — Telephone Encounter (Signed)
Pt was informed that her case worker for [facility] extended 3 visits and that August 2 will be her last appt with Stacy simpson.   Pt verbalized understanding and will return to MD after PT visit.  Garen Lah, PT 10/21/2014 7:40 AM Phone: [phone] Fax: [phone]

## 2014-10-26 ENCOUNTER — Ambulatory Visit: Payer: PRIVATE HEALTH INSURANCE | Admitting: Physical Therapy

## 2014-10-27 ENCOUNTER — Encounter: Payer: Self-pay | Admitting: Family Medicine

## 2014-10-27 ENCOUNTER — Ambulatory Visit (INDEPENDENT_AMBULATORY_CARE_PROVIDER_SITE_OTHER): Payer: 59 | Admitting: Family Medicine

## 2014-10-27 VITALS — BP 155/101 | HR 90 | Temp 98.1°F | Wt 231.0 lb

## 2014-10-27 DIAGNOSIS — G4700 Insomnia, unspecified: Secondary | ICD-10-CM

## 2014-10-27 MED ORDER — ZOLPIDEM TARTRATE 5 MG PO TABS
5.0000 mg | ORAL_TABLET | Freq: Every evening | ORAL | Status: AC | PRN
Start: 2014-10-27 — End: ?

## 2014-10-27 NOTE — Patient Instructions (Signed)
It was a pleasure seeing you today in our clinic. Today we discussed your insomnia. Here is the treatment plan we have discussed and agreed upon together:   - I have prescribed you Ambien  - take this medication before bed (~19min prior). Take this for the next 2-3 nights, then take a 2-3 night "vacation" before taking a tablet for another 2-3 nights. - My hope is to break you from this "cycle" and get you back on a healthy sleep schedule. - I do no plan on keeping you on this medication longer than a month or 2. - Please manage your time schedule so you have enough time after waking to ensure any hangover effects from this medication can pass before getting behind the wheel of your car (as we discussed in the clinic).

## 2014-10-29 DIAGNOSIS — G4700 Insomnia, unspecified: Secondary | ICD-10-CM | POA: Insufficient documentation

## 2014-10-29 NOTE — Assessment & Plan Note (Signed)
Unknown cause at this time. May be related to hx of depression. Denies new pain. Will treat short term in hopes to "reset" her sleep patterns - short course of Ambien >> instructed to take 1 tab/night for 3 nights. Take a 2-3 day vacation, then take another 2-3 nights.   - informed that my hope was to break this insomnia cycle.   - informed this medication is not for longterm use and we would need to reevaluate in a few weeks.   - information on ideal sleep behavior provided.

## 2014-10-29 NOTE — Progress Notes (Signed)
   HPI  CC: insomnia Patient is here w/ c/o insomnia for the past 5wks. Patient states that over this time she has only been sleeping 2-2.5hrs a night. She states she has had no issues w/ initiating sleep as she will fall asleep w/in 15-20minutes of laying down -- but she will then wake up after ~2hrs for "no reason" and won't be able to fall back asleep. Patient has tried multiple kinds of OTC medications w/o any help. She denies any recent changes at home. No new depression, anxiety, or stressors. No new napping, LOC, dizziness, or HAs.  Review of Systems   See HPI for ROS. All other systems reviewed and are negative.  Past medical history and social history reviewed and updated in the EMR as appropriate.  Objective: BP 155/101 mmHg  Pulse 90  Temp(Src) 98.1 F (36.7 C) (Oral)  Wt 231 lb (104.781 kg) Gen: NAD, alert, cooperative, appears fatigued. HEENT: NCAT, EOMI, PERRL CV: RRR, no murmur Resp: CTAB, no wheezes, non-labored Abd: SNTND, BS present, obese Ext: No edema, warm Neuro: Alert and oriented, Speech clear, No gross deficits  Assessment and plan:  Insomnia Unknown cause at this time. May be related to hx of depression. Denies new pain. Will treat short term in hopes to "reset" her sleep patterns - short course of Ambien >> instructed to take 1 tab/night for 3 nights. Take a 2-3 day vacation, then take another 2-3 nights.   - informed that my hope was to break this insomnia cycle.   - informed this medication is not for longterm use and we would need to reevaluate in a few weeks.   - information on ideal sleep behavior provided.    Meds ordered this encounter  Medications  . zolpidem (AMBIEN) 5 MG tablet    Sig: Take 1 tablet (5 mg total) by mouth at bedtime as needed for sleep.    Dispense:  10 tablet    Refill:  1     Kathee Delton, MD,MS,  PGY2 10/29/2014 3:17 PM

## 2014-11-02 ENCOUNTER — Encounter: Payer: Self-pay | Admitting: Physical Therapy

## 2014-11-04 ENCOUNTER — Encounter: Payer: Self-pay | Admitting: Physical Therapy

## 2014-11-09 ENCOUNTER — Encounter: Payer: Self-pay | Admitting: Physical Therapy

## 2014-11-10 ENCOUNTER — Ambulatory Visit: Payer: Self-pay | Admitting: Family Medicine

## 2014-11-11 ENCOUNTER — Encounter: Payer: Self-pay | Admitting: Physical Therapy

## 2014-11-16 ENCOUNTER — Encounter: Payer: Self-pay | Admitting: Physical Therapy

## 2014-11-18 ENCOUNTER — Encounter: Payer: Self-pay | Admitting: Physical Therapy

## 2014-12-31 ENCOUNTER — Ambulatory Visit: Payer: PRIVATE HEALTH INSURANCE | Attending: Family Medicine | Admitting: Physical Therapy

## 2014-12-31 DIAGNOSIS — M6281 Muscle weakness (generalized): Secondary | ICD-10-CM | POA: Insufficient documentation

## 2014-12-31 DIAGNOSIS — R293 Abnormal posture: Secondary | ICD-10-CM | POA: Insufficient documentation

## 2014-12-31 DIAGNOSIS — G8929 Other chronic pain: Secondary | ICD-10-CM | POA: Insufficient documentation

## 2014-12-31 DIAGNOSIS — M25512 Pain in left shoulder: Secondary | ICD-10-CM | POA: Diagnosis present

## 2014-12-31 DIAGNOSIS — M25511 Pain in right shoulder: Secondary | ICD-10-CM | POA: Diagnosis present

## 2014-12-31 DIAGNOSIS — M25612 Stiffness of left shoulder, not elsewhere classified: Secondary | ICD-10-CM | POA: Insufficient documentation

## 2014-12-31 DIAGNOSIS — Z9889 Other specified postprocedural states: Secondary | ICD-10-CM | POA: Insufficient documentation

## 2014-12-31 NOTE — Therapy (Signed)
[facility] [address] [place], [place], [address] Phone: [phone]   Fax:  [phone]  Physical Therapy Evaluation  Patient Details  Name: Hailey Hopkins MRN: [id number] Date of Birth: [birth year] Referring Provider:  Kathee Delton, MD  Encounter Date: 12/31/2014      PT End of Session - 12/31/14 1151    Visit Number 1   Number of Visits 12   Date for PT Re-Evaluation 02/25/15  Allow 8 weeks for 12 visits due to scheduling   PT Start Time 1102   PT Stop Time 1140   PT Time Calculation (min) 38 min   Activity Tolerance Patient tolerated treatment well;Patient limited by pain   Behavior During Therapy [facility] for tasks assessed/performed      Past Medical History  Diagnosis Date  . Diverticular disease   . Depression     Past Surgical History  Procedure Laterality Date  . Abdominal hysterectomy      There were no vitals filed for this visit.  Visit Diagnosis:  Muscle weakness of left arm  Status post subacromial decompression  Status post arthroscopy of shoulder  Shoulder stiffness, left      Subjective Assessment - 12/31/14 1106    Subjective   Patient underwent L UE (SAD, DCR) on 12/14/14.  She has pain, limitations in ROM, swelling.  She has difficulty with ADLs, sleeping, unable to work.  She has a compression garment for home use.  Pain can refer to post arm with ROM exercises.  Denies sensory changes.     Limitations Lifting;House hold activities;Standing   Patient Stated Goals Pt would be able to comb hair, use my L arm normally.    Currently in Pain? Yes   Pain Score 5    Pain Location Shoulder   Pain Orientation Left;Anterior   Pain Descriptors / Indicators Pressure  locking   Pain Type Surgical pain   Pain Onset More than a month ago   Pain Frequency Constant   Aggravating Factors  moving arm    Pain Relieving Factors pain meds, ice, compression, sling if needed            [facility] PT  Assessment - 12/31/14 1112    Assessment   Medical Diagnosis L SAD, DCR   Onset Date/Surgical Date 12/14/14   Hand Dominance Right   Prior Therapy Yes   Precautions   Precautions Shoulder   Precaution Comments No lifting    Restrictions   Weight Bearing Restrictions No   Balance Screen   Has the patient fallen in the past 6 months No   Home Environment   Living Environment Private residence   Living Arrangements Children;Parent   Prior Function   Level of Independence Independent   Vocation Full time employment   Vocation Requirements lifting, reaching   Cognition   Overall Cognitive Status Within Functional Limits for tasks assessed   Observation/Other Assessments   Focus on Therapeutic Outcomes (FOTO)  59%  goal 38%   Observation/Other Assessments-Edema    Edema Circumferential   Circumferential Edema   Circumferential - Right 20.75 inch   Circumferential - Left  21.25 inch   Sensation   Light Touch Appears Intact   Coordination   Gross Motor Movements are Fluid and Coordinated Not tested   AROM   Right/Left Shoulder --  Rt. WNL   Left Shoulder Extension 44 Degrees   Left Shoulder Flexion 45 Degrees   Left Shoulder ABduction 70 Degrees   Left  Shoulder Internal Rotation 75 Degrees  at 90 deg abd   Left Shoulder External Rotation 65 Degrees  at 90 deg abd   PROM   Left Shoulder Flexion 95 Degrees  pain   Left Shoulder ABduction 90 Degrees  pain   Left Shoulder Internal Rotation 70 Degrees  at 90   Left Shoulder External Rotation 80 Degrees  at 90   Strength   Overall Strength --  Rt. WNL   Overall Strength Comments L UE ER and IR able to apply gentle pressure, less than 3+/5 not fully tested due to surgical   Palpation   Palpation comment sore, tender ant aspect of LUE, including along clavicle and biceps           PT Education - 12/31/14 1150    Education provided Yes   Education Details advised her to only let pain increase minimally with AAROM,  stick with current program, advance to cane if able, ionto   Person(s) Educated Patient   Methods Explanation;Handout   Comprehension Verbalized understanding;Need further instruction          PT Short Term Goals - 12/31/14 1158    PT SHORT TERM GOAL #1   Title She will be independent with inital HEP    Time 3   Period Weeks   Status New   PT SHORT TERM GOAL #2   Title Patient will be able to report min improvement in her ability to use L UE for personal care, grooming   Time 3   Period Weeks   Status New   PT SHORT TERM GOAL #3   Title Patient will use RICE, understand self care strategies for home    Time 3   Period Weeks   Status New           PT Long Term Goals - 12/31/14 1204    PT LONG TERM GOAL #1   Title She will be able to do all HEP issued as of last visit   Time 8  visit 12   Period Weeks   Status New   PT LONG TERM GOAL #2   Title Patient will be able to reach to 130 deg (flexion and abduction) or more with pain minimal (less than 4/10)   Time 8   Period Weeks   Status New   PT LONG TERM GOAL #3   Title Patient will complete ADLs, grooming and dressing without lasting pain   Time 8   Period Weeks   Status New   PT LONG TERM GOAL #4   Title Patient will demo strength to 4/5 or more in LUE to allow return to work.    Time 6   Period Weeks   Status New   PT LONG TERM GOAL #5   Title She will be able to lift to waist and carry 10 pounds or more to increase home and work participation as MD allows   Time 6   Period Weeks   Status New   PT LONG TERM GOAL #6   Title Pt will score 42% or less on FOTO to demo functional improvement   Time 6   Period Weeks   Status New               Plan - 12/31/14 1152    Clinical Impression Statement Patient presents with expected functional deficits following surgery, painful at 90 deg of flex/abd.  She has been doing AAROM exercises at home. She will do well  given that she now realize that surgical pain  is different than the pain she had previously with PT.     Pt will benefit from skilled therapeutic intervention in order to improve on the following deficits Decreased activity tolerance;Decreased mobility;Impaired flexibility;Decreased strength;Pain;Increased edema;Impaired UE functional use;Decreased endurance;Increased fascial restricitons;Decreased scar mobility   Rehab Potential Excellent   PT Frequency 2x / week   PT Duration 8 weeks  12 visits initially   PT Treatment/Interventions Therapeutic exercise;Patient/family education;Manual techniques;Passive range of motion;Cryotherapy;Ultrasound;Moist Heat;Electrical Stimulation;Dry needling;Iontophoresis /ml Dexamethasone;Taping;Therapeutic activities;Vasopneumatic Device;ADLs/Self Care Home Management;Other (comment)  Prep for work activities   PT Next Visit Plan AAROM and manual to tolerance, Game ready if desired   PT Home Exercise Plan supine cane    Consulted and Agree with Plan of Care Patient         Problem List Patient Active Problem List   Diagnosis Date Noted  . Insomnia 10/29/2014  . Plantar fasciitis 12/02/2013  . Benign essential HTN 05/28/2013  . Hepatic steatosis 04/12/2011  . Back pain 06/16/2010  . HEMORRHOIDS, EXTERNAL 12/21/2009  . DEPRESSION, MAJOR, MODERATE 04/04/2007  . HAIR LOSS 08/23/2006  . OBESITY NOS 05/28/2006  . FATIGUE 05/28/2006  . GLUCOSE INTOLERANCE 05/23/2006  . Migraine 05/23/2006    Hasel Janish 12/31/2014, 12:16 PM  [facility] [address] [place], [place], [address] Phone: [phone]   Fax:  [phone]  Karie Mainland, PT 12/31/2014 12:16 PM Phone: [phone] Fax: [phone]

## 2014-12-31 NOTE — Patient Instructions (Addendum)
Cane Overhead - Supine  Hold cane at thighs with both hands, extend arms straight over head. Hold _10__ seconds. Repeat __10_ times. Do __2_ times per day.  External Rotation (Eccentric), Active-Assist - Supine (Cane)  Lie on back, affected arm out from side, elbow at 90, forearm forward. Use cane to assist in lifting forearm of affected arm to neutral. Slowly lower for 3-5 seconds. _10__ reps per set, _2__ sets per day, 7___ days per week.   Copyright  VHI. All rights reserved.   Cane Horizontal - Supine    With straight arms holding cane above shoulders, bring cane out to right, center, out to left, and back to above head. Repeat __10_ times. Do __2_ times per day.  Copyright  VHI. All rights reserved.     If too painful, stick with wall/table slides and pendulums  IONTOPHORESIS PATIENT PRECAUTIONS & CONTRAINDICATIONS:  . Redness under one or both electrodes can occur.  This characterized by a uniform redness that usually disappears within 12 hours of treatment. . Small pinhead size blisters may result in response to the drug.  Contact your physician if the problem persists more than 24 hours. . On rare occasions, iontophoresis therapy can result in temporary skin reactions such as rash, inflammation, irritation or burns.  The skin reactions may be the result of individual sensitivity to the ionic solution used, the condition of the skin at the start of treatment, reaction to the materials in the electrodes, allergies or sensitivity to dexamethasone, or a poor connection between the patch and your skin.  Discontinue using iontophoresis if you have any of these reactions and report to your therapist. . Remove the Patch or electrodes if you have any undue sensation of pain or burning during the treatment and report discomfort to your therapist. . Tell your Therapist if you have had known adverse reactions to the application of electrical current. . If using the Patch, the LED  light will turn off when treatment is complete and the patch can be removed.  Approximate treatment time is 1-3 hours.  Remove the patch when light goes off or after 6 hours. . The Patch can be worn during normal activity, however excessive motion where the electrodes have been placed can cause poor contact between the skin and the electrode or uneven electrical current resulting in greater risk of skin irritation. Marland Kitchen Keep out of the reach of children.   . DO NOT use if you have a cardiac pacemaker or any other electrically sensitive implanted device. . DO NOT use if you have a known sensitivity to dexamethasone. . DO NOT use during Magnetic Resonance Imaging (MRI). . DO NOT use over broken or compromised skin (e.g. sunburn, cuts, or acne) due to the increased risk of skin reaction. . DO NOT SHAVE over the area to be treated:  To establish good contact between the Patch and the skin, excessive hair may be clipped. . DO NOT place the Patch or electrodes on or over your eyes, directly over your heart, or brain. . DO NOT reuse the Patch or electrodes as this may cause burns to occur.

## 2015-01-04 ENCOUNTER — Ambulatory Visit: Payer: PRIVATE HEALTH INSURANCE | Admitting: Physical Therapy

## 2015-01-04 DIAGNOSIS — M25512 Pain in left shoulder: Secondary | ICD-10-CM

## 2015-01-04 DIAGNOSIS — Z9889 Other specified postprocedural states: Secondary | ICD-10-CM

## 2015-01-04 DIAGNOSIS — G8929 Other chronic pain: Secondary | ICD-10-CM

## 2015-01-04 DIAGNOSIS — M6281 Muscle weakness (generalized): Secondary | ICD-10-CM | POA: Diagnosis not present

## 2015-01-04 DIAGNOSIS — M25612 Stiffness of left shoulder, not elsewhere classified: Secondary | ICD-10-CM

## 2015-01-04 DIAGNOSIS — M25511 Pain in right shoulder: Secondary | ICD-10-CM

## 2015-01-04 DIAGNOSIS — R293 Abnormal posture: Secondary | ICD-10-CM

## 2015-01-04 NOTE — Therapy (Signed)
[facility] Outpatient Rehabilitation [facility] [address] [place], [place], [address] Phone: [phone]   Fax:  [phone]  Physical Therapy Treatment  Patient Details  Name: Hailey Hopkins MRN: [id number] Date of Birth: 02-Mar-1976 Referring Provider:  Kathee Delton, MD  Encounter Date: 01/04/2015      PT End of Session - 01/04/15 1335    Visit Number 2   Number of Visits 12   Date for PT Re-Evaluation 02/25/15   PT Start Time 0802   PT Stop Time 0902   PT Time Calculation (min) 60 min   Activity Tolerance Patient tolerated treatment well;Patient limited by pain   Behavior During Therapy [facility] for tasks assessed/performed      Past Medical History  Diagnosis Date  . Diverticular disease   . Depression     Past Surgical History  Procedure Laterality Date  . Abdominal hysterectomy      There were no vitals filed for this visit.  Visit Diagnosis:  Muscle weakness of left arm  Status post subacromial decompression  Pain in right shoulder  Abnormal posture  Status post arthroscopy of shoulder  Shoulder stiffness, left  Chronic pain in left shoulder  Stiffness of left shoulder joint      Subjective Assessment - 01/04/15 0804    Subjective 8/10.  Did not sleep pain kept her up.      Currently in Pain? Yes   Pain Score 8    Pain Location Shoulder   Pain Orientation Left;Anterior   Aggravating Factors  hurts at rest and with movement    Pain Relieving Factors cold compression sleeve, medication                         [facility] Adult PT Treatment/Exercise - 01/04/15 0808    Shoulder Exercises: Supine   Other Supine Exercises cane chest press and shoulder flexion.  Painful.   Other Supine Exercises serratus punch AA X 5 This helped pain   Shoulder Exercises: Seated   Retraction AROM;Both;10 reps   Shoulder Exercises: ROM/Strengthening   Other ROM/Strengthening Exercises sitting cane  IR/ER   Shoulder Exercises:  Isometric Strengthening   Flexion --  10 X 5 seconds   Theraband Level (Extension) --  10 X 5 seconds   External Rotation --  10 X 5 seconds   Internal Rotation --  10 X 5 seconds   ABduction --  10 x 5 seconds.    Wrist Exercises   Other wrist exercises towel squeeze , pinch.   Cryotherapy   Number Minutes Cryotherapy 15 Minutes   Cryotherapy Location Shoulder   Type of Cryotherapy Other (comment)  vasopneumatic, moderate pressure, 32 degrees.   Cabin crew IFC   Electrical Stimulation Parameters 9   Electrical Stimulation Goals Pain   Iontophoresis   Type of Iontophoresis Dexamethasone   Location L ant shoulder   Dose 4 dex   Time 6 hour patch    Vasopneumatic   Number Minutes Vasopneumatic  15 minutes   Vasopnuematic Location  Shoulder   Vasopneumatic Pressure Medium   Vasopneumatic Temperature  32   Manual Therapy   Soft tissue mobilization Lt shoulder.                PT Education - 01/04/15 0940    Education provided Yes   Education Details isometrics from ex drawer.   Person(s) Educated Patient  Methods Explanation;Demonstration;Tactile cues;Verbal cues;Handout   Comprehension Verbalized understanding;Returned demonstration          PT Short Term Goals - 01/04/15 1338    PT SHORT TERM GOAL #1   Title She will be independent with inital HEP    Baseline can reproduce correctly without cues.   Time 3   Period Weeks   Status Achieved   PT SHORT TERM GOAL #2   Title Patient will be able to report min improvement in her ability to use L UE for personal care, grooming   Time 3   Period Weeks   Status On-going   PT SHORT TERM GOAL #3   Title Patient will use RICE, understand self care strategies for home    Baseline understands and uses RICE at home   Time 3   Period Weeks   Status Achieved           PT Long Term Goals - 12/31/14 1204    PT LONG TERM  GOAL #1   Title She will be able to do all HEP issued as of last visit   Time 8  visit 12   Period Weeks   Status New   PT LONG TERM GOAL #2   Title Patient will be able to reach to 130 deg (flexion and abduction) or more with pain minimal (less than 4/10)   Time 8   Period Weeks   Status New   PT LONG TERM GOAL #3   Title Patient will complete ADLs, grooming and dressing without lasting pain   Time 8   Period Weeks   Status New   PT LONG TERM GOAL #4   Title Patient will demo strength to 4/5 or more in LUE to allow return to work.    Time 6   Period Weeks   Status New   PT LONG TERM GOAL #5   Title She will be able to lift to waist and carry 10 pounds or more to increase home and work participation as MD allows   Time 6   Period Weeks   Status New   PT LONG TERM GOAL #6   Title Pt will score 42% or less on FOTO to demo functional improvement   Time 6   Period Weeks   Status New               Plan - 01/04/15 1335    Clinical Impression Statement Patient very painful today.  She did not think ionto helped but when explained it may take a treatment or up to 3 prior to working.  Progress with home exercises with isometrics.     PT Next Visit Plan assess today's visits, modalities,  Try UE ranger, rewiew isometrics,  Measure ROM .     PT Home Exercise Plan isometrics   Consulted and Agree with Plan of Care Patient        Problem List Patient Active Problem List   Diagnosis Date Noted  . Insomnia 10/29/2014  . Plantar fasciitis 12/02/2013  . Benign essential HTN 05/28/2013  . Hepatic steatosis 04/12/2011  . Back pain 06/16/2010  . HEMORRHOIDS, EXTERNAL 12/21/2009  . DEPRESSION, MAJOR, MODERATE 04/04/2007  . HAIR LOSS 08/23/2006  . OBESITY NOS 05/28/2006  . FATIGUE 05/28/2006  . GLUCOSE INTOLERANCE 05/23/2006  . Migraine 05/23/2006    HARRIS,KAREN 01/04/2015, 1:41 PM  [facility] [address] [place], [place], [address] Phone: [phone]   Fax:  [phone]  Liz Beach, PTA 01/04/2015 1:41 PM Phone: [phone] Fax: [phone]

## 2015-01-06 ENCOUNTER — Ambulatory Visit: Payer: PRIVATE HEALTH INSURANCE | Admitting: Physical Therapy

## 2015-01-06 DIAGNOSIS — G8929 Other chronic pain: Secondary | ICD-10-CM

## 2015-01-06 DIAGNOSIS — M25512 Pain in left shoulder: Secondary | ICD-10-CM

## 2015-01-06 DIAGNOSIS — Z9889 Other specified postprocedural states: Secondary | ICD-10-CM

## 2015-01-06 DIAGNOSIS — M6281 Muscle weakness (generalized): Secondary | ICD-10-CM

## 2015-01-06 DIAGNOSIS — M25612 Stiffness of left shoulder, not elsewhere classified: Secondary | ICD-10-CM

## 2015-01-06 NOTE — Therapy (Signed)
[facility] [address] [place], [place], [address] Phone: [phone]   Fax:  [phone]  Physical Therapy Treatment  Patient Details  Name: Hailey ButteryFelicia G Fukushima MRN: [id number] Date of Birth: [birth year] Referring Provider:  Kathee DeltonMcKeag, Ian D, MD  Encounter Date: 01/06/2015      PT End of Session - 01/06/15 1621    Visit Number 3   Number of Visits 12   Date for PT Re-Evaluation 02/25/15   PT Start Time 1545   PT Stop Time 1635   PT Time Calculation (min) 50 min   Activity Tolerance Patient tolerated treatment well;Patient limited by pain   Behavior [facility] Therapy [facility] for tasks assessed/performed      Past Medical History  Diagnosis Date  . Diverticular disease   . Depression     Past Surgical History  Procedure Laterality Date  . Abdominal hysterectomy      There were no vitals filed for this visit.  Visit Diagnosis:  Muscle weakness of left arm  Status post subacromial decompression  Shoulder stiffness, left  Chronic pain in left shoulder  Stiffness of left shoulder joint      Subjective Assessment - 01/06/15 1551    Subjective Tired, pain increased because now i'm doing more.  I took a pain pill today.    Currently in Pain? Yes   Pain Score 6    Pain Location Shoulder   Pain Orientation Left;Anterior   Pain Descriptors / Indicators Aching   Pain Type Surgical pain   Pain Onset More than a month ago   Pain Frequency Constant            [facility] Adult PT Treatment/Exercise - 01/06/15 1558    Shoulder Exercises: Supine   External Rotation AAROM;Left;10 reps   Theraband Level (Shoulder External Rotation) --  ranger   Flexion AAROM;Both;10 reps   Other Supine Exercises chest press x10    Shoulder Exercises: Sidelying   Flexion Left;10 reps   Other Sidelying Exercises circles and row/punch x 10 each    Shoulder Exercises: Standing   Flexion AAROM;Left;10 reps   Cabin crewlectrical Stimulation   Electrical Stimulation Location shoulder   Electrical Stimulation Action IFC   Electrical Stimulation Parameters 13   Electrical Stimulation Goals Pain   Iontophoresis   Type of Iontophoresis Dexamethasone   Location L ant shoulder   Dose 4 dex   Time 6 hour patch    Manual Therapy   Joint Mobilization GR. 1-2 mobs A/P and inf glide to increase abd and flex   Passive ROM all planes to tolerance                PT Education - 01/06/15 1621    Education provided No          PT Short Term Goals - 01/04/15 1338    PT SHORT TERM GOAL #1   Title She will be independent with inital HEP    Baseline can reproduce correctly without cues.   Time 3   Period Weeks   Status Achieved   PT SHORT TERM GOAL #2   Title Patient will be able to report min improvement in her ability to use L UE for personal care, grooming   Time 3   Period Weeks   Status On-going   PT SHORT TERM GOAL #3   Title Patient will use RICE, understand self care strategies for home    Baseline understands and uses RICE at home   Time  3   Period Weeks   Status Achieved           PT Long Term Goals - 12/31/14 1204    PT LONG TERM GOAL #1   Title She will be able to do all HEP issued as of last visit   Time 8  visit 12   Period Weeks   Status New   PT LONG TERM GOAL #2   Title Patient will be able to reach to 130 deg (flexion and abduction) or more with pain minimal (less than 4/10)   Time 8   Period Weeks   Status New   PT LONG TERM GOAL #3   Title Patient will complete ADLs, grooming and dressing without lasting pain   Time 8   Period Weeks   Status New   PT LONG TERM GOAL #4   Title Patient will demo strength to 4/5 or more in LUE to allow return to work.    Time 6   Period Weeks   Status New   PT LONG TERM GOAL #5   Title She will be able to lift to waist and carry 10 pounds or more to increase home and work participation as MD allows   Time 6   Period Weeks   Status New   PT LONG  TERM GOAL #6   Title Pt will score 42% or less on FOTO to demo functional improvement   Time 6   Period Weeks   Status New               Plan - 01/06/15 1621    Clinical Impression Statement Patient still with increased pain, low tolerance for supine cane ex.  Didi not do isometrics as she did them earlier. Progress gently.    PT Next Visit Plan  modalities,  Try UE ranger, rewiew isometrics,  Measure ROM .     PT Home Exercise Plan isometrics and AAROM    Consulted and Agree with Plan of Care Patient        Problem List Patient Active Problem List   Diagnosis Date Noted  . Insomnia 10/29/2014  . Plantar fasciitis 12/02/2013  . Benign essential HTN 05/28/2013  . Hepatic steatosis 04/12/2011  . Back pain 06/16/2010  . HEMORRHOIDS, EXTERNAL 12/21/2009  . DEPRESSION, MAJOR, MODERATE 04/04/2007  . HAIR LOSS 08/23/2006  . OBESITY NOS 05/28/2006  . FATIGUE 05/28/2006  . GLUCOSE INTOLERANCE 05/23/2006  . Migraine 05/23/2006    PAA,JENNIFER 01/06/2015, 4:28 PM  [facility] [address] [place], [place], [address] Phone: [phone]   Fax:  [phone]   Karie Mainland, PT 01/06/2015 4:29 PM Phone: [phone] Fax: [phone]

## 2015-01-13 ENCOUNTER — Ambulatory Visit: Payer: PRIVATE HEALTH INSURANCE | Admitting: Physical Therapy

## 2015-01-17 ENCOUNTER — Ambulatory Visit: Payer: PRIVATE HEALTH INSURANCE | Admitting: Physical Therapy

## 2015-01-17 DIAGNOSIS — M6281 Muscle weakness (generalized): Secondary | ICD-10-CM

## 2015-01-17 DIAGNOSIS — M25612 Stiffness of left shoulder, not elsewhere classified: Secondary | ICD-10-CM

## 2015-01-17 DIAGNOSIS — Z9889 Other specified postprocedural states: Secondary | ICD-10-CM

## 2015-01-17 DIAGNOSIS — R293 Abnormal posture: Secondary | ICD-10-CM

## 2015-01-17 DIAGNOSIS — M25512 Pain in left shoulder: Secondary | ICD-10-CM

## 2015-01-17 DIAGNOSIS — G8929 Other chronic pain: Secondary | ICD-10-CM

## 2015-01-17 NOTE — Therapy (Signed)
[place], [place], [address] Phone: [phone]   Fax:  [phone]  Physical Therapy Treatment  Patient Details  Name: Hailey Hopkins MRN: [id number] Date of Birth: 08-09-1975 No Data Recorded  Encounter Date: 01/17/2015      PT End of Session - 01/17/15 1325    Visit Number 4   Number of Visits 12   PT Start Time 5681   PT Stop Time 1145   PT Time Calculation (min) 42 min   Activity Tolerance Patient tolerated treatment well;Patient limited by pain   Behavior During Therapy [facility] for tasks assessed/performed      Past Medical History  Diagnosis Date  . Diverticular disease   . Depression     Past Surgical History  Procedure Laterality Date  . Abdominal hysterectomy      There were no vitals filed for this visit.  Visit Diagnosis:  Muscle weakness of left arm  Status post subacromial decompression  Shoulder stiffness, left  Chronic pain in left shoulder  Stiffness of left shoulder joint  Abnormal posture  Status post arthroscopy of shoulder      Subjective Assessment - 01/17/15 1117    Subjective 4/10 pain now.  Doing her home exercise program.    Sleps with arm hanging off bes, prone, or supine with pillows posterior shoulder.   Currently in Pain? Yes   Pain Score 4    Pain Location Shoulder   Pain Orientation Left;Anterior   Pain Descriptors / Indicators Aching   Pain Frequency Constant   Pain Relieving Factors medication, cold compression   Pain Score 7   Pain Location Chest   Pain Orientation Left   Pain Descriptors / Indicators Sharp  catches at sternum junction   Aggravating Factors  moving head to RT and down.  reaching up with arms.   Pain Relieving Factors avoiding  movements.                           [facility] Adult PT Treatment/Exercise - 01/17/15 1131    Shoulder Exercises: Seated   Other Seated Exercises UE Ranger flexion, circles, seated  2 different heights.  with IFC   Shoulder Exercises: Isometric Strengthening   ADduction Limitations 15 X for warm up   Electrical Stimulation   Electrical Stimulation Location shoulder   Electrical Stimulation Action IFC   Electrical Stimulation Parameters 30+  during session/ exercises for pain   Manual Therapy   Passive ROM flexion, EWR abduction with e-stim  90 degrees flexion at best LT  wrist elbow PROM,  all planes with IFC.  patient guarding.                   PT Short Term Goals - 01/17/15 1328    PT SHORT TERM GOAL #1   Title She will be independent with inital HEP    Baseline can reproduce correctly without cues.   Time 3   Period Weeks   Status Achieved   PT SHORT TERM GOAL #2   Title Patient will be able to report min improvement in her ability to use L UE for personal care, grooming   Baseline AA arm to fix hair   Time 3   Period Weeks   Status Partially Met   PT SHORT TERM GOAL #3   Title Patient will use RICE, understand self care strategies for home    Status Achieved  PT Long Term Goals - 12/31/14 1204    PT LONG TERM GOAL #1   Title She will be able to do all HEP issued as of last visit   Time 8  visit 12   Period Weeks   Status New   PT LONG TERM GOAL #2   Title Patient will be able to reach to 130 deg (flexion and abduction) or more with pain minimal (less than 4/10)   Time 8   Period Weeks   Status New   PT LONG TERM GOAL #3   Title Patient will complete ADLs, grooming and dressing without lasting pain   Time 8   Period Weeks   Status New   PT LONG TERM GOAL #4   Title Patient will demo strength to 4/5 or more in LUE to allow return to work.    Time 6   Period Weeks   Status New   PT LONG TERM GOAL #5   Title She will be able to lift to waist and carry 10 pounds or more to increase home and work participation as MD allows   Time 6   Period Weeks   Status New   PT LONG TERM GOAL #6   Title Pt will score 42% or  less on FOTO to demo functional improvement   Time 6   Period Weeks   Status New               Plan - 01/17/15 1325    Clinical Impression Statement IFC helpful with session., Dry needle helpful .  90 degrees best PROM today.  Able to AA Lt arm up to fix her hair in a pony tail.     PT Next Visit Plan  modalities,  Try UE ranger, rewiew isometrics,  Measure ROM .  ROM    Consulted and Agree with Plan of Care Patient        Problem List Patient Active Problem List   Diagnosis Date Noted  . Insomnia 10/29/2014  . Plantar fasciitis 12/02/2013  . Benign essential HTN 05/28/2013  . Hepatic steatosis 04/12/2011  . Back pain 06/16/2010  . HEMORRHOIDS, EXTERNAL 12/21/2009  . DEPRESSION, MAJOR, MODERATE 04/04/2007  . HAIR LOSS 08/23/2006  . OBESITY NOS 05/28/2006  . FATIGUE 05/28/2006  . GLUCOSE INTOLERANCE 05/23/2006  . Migraine 05/23/2006    Mareesa Gathright 01/17/2015, 1:30 PM  [facility] [address] [place], [place], [address] Phone: [phone]   Fax:  [phone]  Name: KYLEIGH NANNINI MRN: [id number] Date of Birth: 11/13/1975    Melvenia Needles, PTA 01/17/2015 1:30 PM Phone: [phone] Fax: [phone]

## 2015-01-20 ENCOUNTER — Ambulatory Visit: Payer: PRIVATE HEALTH INSURANCE | Admitting: Physical Therapy

## 2015-01-20 DIAGNOSIS — G8929 Other chronic pain: Secondary | ICD-10-CM

## 2015-01-20 DIAGNOSIS — R293 Abnormal posture: Secondary | ICD-10-CM

## 2015-01-20 DIAGNOSIS — Z9889 Other specified postprocedural states: Secondary | ICD-10-CM

## 2015-01-20 DIAGNOSIS — M25612 Stiffness of left shoulder, not elsewhere classified: Secondary | ICD-10-CM

## 2015-01-20 DIAGNOSIS — M6281 Muscle weakness (generalized): Secondary | ICD-10-CM

## 2015-01-20 DIAGNOSIS — M25512 Pain in left shoulder: Secondary | ICD-10-CM

## 2015-01-20 NOTE — Therapy (Signed)
[place], [place], [address] Phone: [phone]   Fax:  [phone]  Physical Therapy Treatment  Patient Details  Name: Hailey Hopkins MRN: [id number] Date of Birth: 03-07-1976 No Data Recorded  Encounter Date: 01/20/2015      PT End of Session - 01/20/15 1134    Visit Number 5   Number of Visits 12   Date for PT Re-Evaluation 02/25/15   PT Start Time 0847   PT Stop Time 0930   PT Time Calculation (min) 43 min   Activity Tolerance Patient tolerated treatment well;Patient limited by pain   Behavior During Therapy [facility] for tasks assessed/performed      Past Medical History  Diagnosis Date  . Diverticular disease   . Depression     Past Surgical History  Procedure Laterality Date  . Abdominal hysterectomy      There were no vitals filed for this visit.  Visit Diagnosis:  Muscle weakness of left arm  Status post subacromial decompression  Shoulder stiffness, left  Chronic pain in left shoulder  Stiffness of left shoulder joint  Abnormal posture      Subjective Assessment - 01/20/15 0850    Subjective 1-2/10.  doing her home exercises  Has not slept well.  E stim helped during last session.    Currently in Pain? Yes   Pain Score 2    Pain Location Shoulder   Pain Orientation Left;Anterior   Pain Descriptors / Indicators Aching   Aggravating Factors  sleeping, movement   Pain Relieving Factors E stim during exercise                         [facility] Adult PT Treatment/Exercise - 01/20/15 6283    Shoulder Exercises: Seated   Other Seated Exercises UE ranger flexion, circles horizontal adduction multole reps.   multiple heights.    Shoulder Exercises: Pulleys   Flexion --  3 minutes 120 degrees with e-stim   ABduction 2 minutes  with e-stim   Shoulder Exercises: ROM/Strengthening   Other ROM/Strengthening Exercises 120 flexion 55 RE,  Abduction. 95 degrees.   Passive   Shoulder Exercises: Isometric Strengthening   Extension Limitations 10 X 10 seconds   Electrical Stimulation   Electrical Stimulation Location shoulder   Electrical Stimulation Action IFC   Electrical Stimulation Parameters 13   Electrical Stimulation Goals Pain  during exercise.    Manual Therapy   Passive ROM flexion, abduction, er                  PT Short Term Goals - 01/17/15 1328    PT SHORT TERM GOAL #1   Title She will be independent with inital HEP    Baseline can reproduce correctly without cues.   Time 3   Period Weeks   Status Achieved   PT SHORT TERM GOAL #2   Title Patient will be able to report min improvement in her ability to use L UE for personal care, grooming   Baseline AA arm to fix hair   Time 3   Period Weeks   Status Partially Met   PT SHORT TERM GOAL #3   Title Patient will use RICE, understand self care strategies for home    Status Achieved           PT Long Term Goals - 12/31/14 1204    PT LONG TERM GOAL #1   Title She will be  able to do all HEP issued as of last visit   Time 8  visit 12   Period Weeks   Status New   PT LONG TERM GOAL #2   Title Patient will be able to reach to 130 deg (flexion and abduction) or more with pain minimal (less than 4/10)   Time 8   Period Weeks   Status New   PT LONG TERM GOAL #3   Title Patient will complete ADLs, grooming and dressing without lasting pain   Time 8   Period Weeks   Status New   PT LONG TERM GOAL #4   Title Patient will demo strength to 4/5 or more in LUE to allow return to work.    Time 6   Period Weeks   Status New   PT LONG TERM GOAL #5   Title She will be able to lift to waist and carry 10 pounds or more to increase home and work participation as MD allows   Time 6   Period Weeks   Status New   PT LONG TERM GOAL #6   Title Pt will score 42% or less on FOTO to demo functional improvement   Time 6   Period Weeks   Status New                Plan - 01/20/15 1135    Clinical Impression Statement Less pain post session in clinic and after with IFC.  ROM improving.  Mat be ready for more home exercises.     PT Next Visit Plan update home exercises if able.     Consulted and Agree with Plan of Care Patient        Problem List Patient Active Problem List   Diagnosis Date Noted  . Insomnia 10/29/2014  . Plantar fasciitis 12/02/2013  . Benign essential HTN 05/28/2013  . Hepatic steatosis 04/12/2011  . Back pain 06/16/2010  . HEMORRHOIDS, EXTERNAL 12/21/2009  . DEPRESSION, MAJOR, MODERATE 04/04/2007  . HAIR LOSS 08/23/2006  . OBESITY NOS 05/28/2006  . FATIGUE 05/28/2006  . GLUCOSE INTOLERANCE 05/23/2006  . Migraine 05/23/2006    [facility] 01/20/2015, 11:38 AM  [facility] [address] [place], [place], [address] Phone: [phone]   Fax:  [phone]  Name: Hailey Hopkins MRN: [id number] Date of Birth: August 27, 1975    Melvenia Needles, PTA 01/20/2015 11:38 AM Phone: [phone] Fax: [phone]

## 2015-01-24 ENCOUNTER — Ambulatory Visit: Payer: PRIVATE HEALTH INSURANCE | Admitting: Physical Therapy

## 2015-01-24 DIAGNOSIS — M6281 Muscle weakness (generalized): Secondary | ICD-10-CM | POA: Diagnosis not present

## 2015-01-24 DIAGNOSIS — M25612 Stiffness of left shoulder, not elsewhere classified: Secondary | ICD-10-CM

## 2015-01-24 DIAGNOSIS — M25512 Pain in left shoulder: Secondary | ICD-10-CM

## 2015-01-24 DIAGNOSIS — G8929 Other chronic pain: Secondary | ICD-10-CM

## 2015-01-24 DIAGNOSIS — Z9889 Other specified postprocedural states: Secondary | ICD-10-CM

## 2015-01-24 NOTE — Therapy (Signed)
[place], [place], [address] Phone: [phone]   Fax:  [phone]  Physical Therapy Treatment  Patient Details  Name: Hailey Hopkins MRN: [id number] Date of Birth: 05/24/1975 No Data Recorded  Encounter Date: 01/24/2015      PT End of Session - 01/24/15 1144    Visit Number 6   Number of Visits 12   Date for PT Re-Evaluation 02/25/15   PT Start Time 0945  Pt 13 min late   PT Stop Time 1030   PT Time Calculation (min) 45 min   Activity Tolerance Patient limited by pain   Behavior During Therapy [facility] for tasks assessed/performed      Past Medical History  Diagnosis Date  . Diverticular disease   . Depression     Past Surgical History  Procedure Laterality Date  . Abdominal hysterectomy      There were no vitals filed for this visit.  Visit Diagnosis:  Muscle weakness of left arm  Status post subacromial decompression  Shoulder stiffness, left  Chronic pain in left shoulder  Status post arthroscopy of shoulder      Subjective Assessment - 01/24/15 0946    Subjective I may have overdone it on Thursday.  Friday AM spasms, pain all weekend.  Barely slept.  Called MD but has not returned call today.    Currently in Pain? Yes   Pain Score 6    Pain Location Shoulder  clavicle   Pain Orientation Left;Anterior   Pain Descriptors / Indicators Aching   Pain Type Surgical pain   Pain Onset More than a month ago   Pain Frequency Constant             [facility] Adult PT Treatment/Exercise - 01/24/15 1158    Moist Heat Therapy   Number Minutes Moist Heat 15 Minutes   Moist Heat Location Shoulder   Electrical Stimulation   Electrical Stimulation Location shoulder   Electrical Stimulation Action IFC   Electrical Stimulation Parameters 19   Electrical Stimulation Goals Pain  during exercise.    Ultrasound   Ultrasound Location L distal clavicle and superior aspect    Ultrasound  Parameters 20%, 1.0 W/cm2, 3.3 Mhz   Manual Therapy   Soft tissue mobilization distal clavicle and into ant/lateral shoulder  light pressure    Passive ROM flexion, abduction and ER to tolerance   Other Manual Therapy oscillation, gr I mob                PT Education - 01/24/15 1144    Education provided No          PT Short Term Goals - 01/17/15 1328    PT SHORT TERM GOAL #1   Title She will be independent with inital HEP    Baseline can reproduce correctly without cues.   Time 3   Period Weeks   Status Achieved   PT SHORT TERM GOAL #2   Title Patient will be able to report min improvement in her ability to use L UE for personal care, grooming   Baseline AA arm to fix hair   Time 3   Period Weeks   Status Partially Met   PT SHORT TERM GOAL #3   Title Patient will use RICE, understand self care strategies for home    Status Achieved           PT Long Term Goals - 12/31/14 1204    PT LONG TERM GOAL #  1   Title She will be able to do all HEP issued as of last visit   Time 8  visit 12   Period Weeks   Status New   PT LONG TERM GOAL #2   Title Patient will be able to reach to 130 deg (flexion and abduction) or more with pain minimal (less than 4/10)   Time 8   Period Weeks   Status New   PT LONG TERM GOAL #3   Title Patient will complete ADLs, grooming and dressing without lasting pain   Time 8   Period Weeks   Status New   PT LONG TERM GOAL #4   Title Patient will demo strength to 4/5 or more in LUE to allow return to work.    Time 6   Period Weeks   Status New   PT LONG TERM GOAL #5   Title She will be able to lift to waist and carry 10 pounds or more to increase home and work participation as MD allows   Time 6   Period Weeks   Status New   PT LONG TERM GOAL #6   Title Pt will score 42% or less on FOTO to demo functional improvement   Time 6   Period Weeks   Status New               Plan - 01/24/15 1146    Clinical Impression  Statement Patient will call MD again today, pain increased since last week, sharp pain along L clavicle and into axilla.  Decr AROM.  Modalities only today.   PT Next Visit Plan update home exercises if able.  resume ther ex   PT Home Exercise Plan isometrics and AAROM    Consulted and Agree with Plan of Care Patient        Problem List Patient Active Problem List   Diagnosis Date Noted  . Insomnia 10/29/2014  . Plantar fasciitis 12/02/2013  . Benign essential HTN 05/28/2013  . Hepatic steatosis 04/12/2011  . Back pain 06/16/2010  . HEMORRHOIDS, EXTERNAL 12/21/2009  . DEPRESSION, MAJOR, MODERATE 04/04/2007  . HAIR LOSS 08/23/2006  . OBESITY NOS 05/28/2006  . FATIGUE 05/28/2006  . GLUCOSE INTOLERANCE 05/23/2006  . Migraine 05/23/2006    Tanyon Alipio 01/24/2015, 12:01 PM  [facility] [address] [place], [place], [address] Phone: [phone]   Fax:  [phone]  Name: VICY MEDICO MRN: [id number] Date of Birth: [birth year]  Raeford Razor, PT 01/24/2015 12:01 PM Phone: [phone] Fax: [phone]

## 2015-01-27 ENCOUNTER — Ambulatory Visit: Payer: PRIVATE HEALTH INSURANCE | Attending: Family Medicine | Admitting: Physical Therapy

## 2015-01-27 DIAGNOSIS — Z9889 Other specified postprocedural states: Secondary | ICD-10-CM | POA: Diagnosis present

## 2015-01-27 DIAGNOSIS — G8929 Other chronic pain: Secondary | ICD-10-CM | POA: Insufficient documentation

## 2015-01-27 DIAGNOSIS — M25612 Stiffness of left shoulder, not elsewhere classified: Secondary | ICD-10-CM | POA: Insufficient documentation

## 2015-01-27 DIAGNOSIS — M25512 Pain in left shoulder: Secondary | ICD-10-CM | POA: Diagnosis present

## 2015-01-27 DIAGNOSIS — M6281 Muscle weakness (generalized): Secondary | ICD-10-CM | POA: Diagnosis not present

## 2015-01-27 DIAGNOSIS — R293 Abnormal posture: Secondary | ICD-10-CM | POA: Insufficient documentation

## 2015-01-27 NOTE — Therapy (Signed)
[place], [place], [address] Phone: [phone]   Fax:  [phone]  Physical Therapy Treatment  Patient Details  Name: Hailey Hopkins MRN: [id number] Date of Birth: Apr 30, 1975 No Data Recorded  Encounter Date: 01/27/2015      PT End of Session - 01/27/15 1326    Visit Number 7   Number of Visits 12   Date for PT Re-Evaluation 02/25/15   PT Start Time 1138   PT Stop Time 1230   PT Time Calculation (min) 52 min   Activity Tolerance Patient tolerated treatment well;Patient limited by pain   Behavior During Therapy [facility] for tasks assessed/performed      Past Medical History  Diagnosis Date  . Diverticular disease   . Depression     Past Surgical History  Procedure Laterality Date  . Abdominal hysterectomy      There were no vitals filed for this visit.  Visit Diagnosis:  Muscle weakness of left arm  Shoulder stiffness, left  Chronic pain in left shoulder  Status post arthroscopy of shoulder  Stiffness of left shoulder joint      Subjective Assessment - 01/27/15 1142    Subjective 6/10 pain with touching.  sternum/ clavicle     Currently in Pain? Yes   Pain Score 2   up to 6/10   Pain Location Shoulder   Pain Orientation Left   Pain Radiating Towards sternum, catched, arm pit constant and varies.     Pain Frequency Constant   Aggravating Factors  sleeping, movement.     Pain Relieving Factors medication, rest, heat, ice.     Pain Score 8   Pain Location Chest   Pain Frequency Intermittent   Aggravating Factors  reaching. making a poney tale.     Pain Relieving Factors meds help a little                         [facility] Adult PT Treatment/Exercise - 01/27/15 1138    Shoulder Exercises: ROM/Strengthening   Other ROM/Strengthening Exercises table slides, flexion   Shoulder Exercises: Isometric Strengthening   Extension Limitations 10 X 10   External Rotation  Limitations 10 X 10   Internal Rotation --  10 X 10   Manual Therapy   Passive ROM Flexion, ER abduction   Other Manual Therapy 113 flexion, abduction 85, ER 45 degrees visually  measured.                PT Education - 01/27/15 1327    Education provided Yes   Education Details impingement explained posture importance   Person(s) Educated Patient   Methods Explanation;Demonstration;Verbal cues   Comprehension Verbalized understanding;Returned demonstration          PT Short Term Goals - 01/17/15 1328    PT SHORT TERM GOAL #1   Title She will be independent with inital HEP    Baseline can reproduce correctly without cues.   Time 3   Period Weeks   Status Achieved   PT SHORT TERM GOAL #2   Title Patient will be able to report min improvement in her ability to use L UE for personal care, grooming   Baseline AA arm to fix hair   Time 3   Period Weeks   Status Partially Met   PT SHORT TERM GOAL #3   Title Patient will use RICE, understand self care strategies for home    Status Achieved  PT Long Term Goals - 01/27/15 1328    PT LONG TERM GOAL #1   Title She will be able to do all HEP issued as of last visit   Time 8   Period Weeks   Status On-going   PT LONG TERM GOAL #2   Title Patient will be able to reach to 130 deg (flexion and abduction) or more with pain minimal (less than 4/10)   Time 8   Period Weeks   Status On-going   PT LONG TERM GOAL #3   Title Patient will complete ADLs, grooming and dressing without lasting pain   Baseline more painful in the last week   Time 8   Period Weeks   Status On-going   PT LONG TERM GOAL #4   Title Patient will demo strength to 4/5 or more in LUE to allow return to work.    Time 6   Period Weeks   Status On-going               Plan - 01/27/15 1326    Clinical Impression Statement Medications help a little.  Gentle ROM and Isometrics today.  Modalities helpful.          Problem  List Patient Active Problem List   Diagnosis Date Noted  . Insomnia 10/29/2014  . Plantar fasciitis 12/02/2013  . Benign essential HTN 05/28/2013  . Hepatic steatosis 04/12/2011  . Back pain 06/16/2010  . HEMORRHOIDS, EXTERNAL 12/21/2009  . DEPRESSION, MAJOR, MODERATE 04/04/2007  . HAIR LOSS 08/23/2006  . OBESITY NOS 05/28/2006  . FATIGUE 05/28/2006  . GLUCOSE INTOLERANCE 05/23/2006  . Migraine 05/23/2006    Hopkins,Hailey 01/27/2015, 1:31 PM  [facility] [address] [place], [place], [address] Phone: [phone]   Fax:  [phone]  Name: Hailey Hopkins MRN: [id number] Date of Birth: February 27, 1976    Hailey Hopkins, PTA 01/27/2015 1:31 PM Phone: [phone] Fax: [phone]

## 2015-01-31 ENCOUNTER — Ambulatory Visit: Payer: PRIVATE HEALTH INSURANCE | Admitting: Physical Therapy

## 2015-01-31 DIAGNOSIS — M25612 Stiffness of left shoulder, not elsewhere classified: Secondary | ICD-10-CM

## 2015-01-31 DIAGNOSIS — M25512 Pain in left shoulder: Secondary | ICD-10-CM

## 2015-01-31 DIAGNOSIS — Z9889 Other specified postprocedural states: Secondary | ICD-10-CM

## 2015-01-31 DIAGNOSIS — G8929 Other chronic pain: Secondary | ICD-10-CM

## 2015-01-31 DIAGNOSIS — M6281 Muscle weakness (generalized): Secondary | ICD-10-CM | POA: Diagnosis not present

## 2015-01-31 NOTE — Therapy (Signed)
[place], [place], [address] Phone: [phone]   Fax:  [phone]  Physical Therapy Treatment  Patient Details  Name: Hailey Hopkins MRN: [id number] Date of Birth: 10-15-1975 No Data Recorded  Encounter Date: 01/31/2015      PT End of Session - 01/31/15 1307    Visit Number 8   Number of Visits 12   PT Start Time 3267   PT Stop Time 1245   PT Time Calculation (min) 57 min   Activity Tolerance Patient tolerated treatment well   Behavior During Therapy [facility] for tasks assessed/performed      Past Medical History  Diagnosis Date  . Diverticular disease   . Depression     Past Surgical History  Procedure Laterality Date  . Abdominal hysterectomy      There were no vitals filed for this visit.  Visit Diagnosis:  Muscle weakness of left arm  Shoulder stiffness, left  Chronic pain in left shoulder  Status post arthroscopy of shoulder      Subjective Assessment - 01/31/15 1154    Subjective 6/10 Same places plus down the back of arm.     Currently in Pain? Yes   Pain Score 6    Pain Location Shoulder   Pain Orientation Left   Pain Descriptors / Indicators --  pinch   Pain Radiating Towards mid arm, clavicle  posterior sarm last 2 days   Pain Frequency Intermittent   Aggravating Factors  sleeping movementpronation   Pain Relieving Factors rest, medsa heat ice                         [facility] Adult PT Treatment/Exercise - 01/31/15 1215    Shoulder Exercises: Supine   Other Supine Exercises cane chest press. horizontal abduction/Adduction, Flexion, ER/IR,, 15 degrees ER, 128 degrees AAROM LT   Shoulder Exercises: Pulleys   Flexion --  with e-stim  10 minutes.   Moist Heat Therapy   Number Minutes Moist Heat 15 Minutes   Moist Heat Location Shoulder   Electrical Stimulation   Electrical Stimulation Location shoulder   Electrical Stimulation Action IFC   Electrical Stimulation Parameters 15   Electrical Stimulation Goals Pain  with exercises                  PT Short Term Goals - 01/31/15 1311    PT SHORT TERM GOAL #1   Title She will be independent with inital HEP    Status Achieved   PT SHORT TERM GOAL #2   Title Patient will be able to report min improvement in her ability to use L UE for personal care, grooming   Baseline AA arm to fix hair   Time 3   Period Weeks   Status Partially Met   PT SHORT TERM GOAL #3   Title Patient will use RICE, understand self care strategies for home    Baseline understands and uses RICE at home   Time 3   Period Weeks   Status Achieved           PT Long Term Goals - 01/31/15 1312    PT LONG TERM GOAL #1   Title She will be able to do all HEP issued as of last visit   Time 8   Period Weeks   Status On-going   PT LONG TERM GOAL #2   Title Patient will be able to reach to 130  deg (flexion and abduction) or more with pain minimal (less than 4/10)   Baseline 7/10 pain,   ROM about 100 Actively today   Time 8   Period Weeks   Status On-going   PT LONG TERM GOAL #3   Title Patient will complete ADLs, grooming and dressing without lasting pain   Time 8   Period Weeks   Status On-going   PT LONG TERM GOAL #4   Title Patient will demo strength to 4/5 or more in LUE to allow return to work.    Time 6   Period Weeks   Status Unable to assess   PT LONG TERM GOAL #5   Title She will be able to lift to waist and carry 10 pounds or more to increase home and work participation as MD allows   Time 6   Period Weeks   Status On-going               Plan - 01/31/15 1308    Clinical Impression Statement Patient continues to be painful with reaching.  New pain posterior arm last 2 days.  128 supine flexion with cane.  Increased pain not sure reason.     PT Next Visit Plan Continue to push ROM.     PT Home Exercise Plan cane, ROM emphasis   Consulted and Agree with Plan of Care  Patient        Problem List Patient Active Problem List   Diagnosis Date Noted  . Insomnia 10/29/2014  . Plantar fasciitis 12/02/2013  . Benign essential HTN 05/28/2013  . Hepatic steatosis 04/12/2011  . Back pain 06/16/2010  . HEMORRHOIDS, EXTERNAL 12/21/2009  . DEPRESSION, MAJOR, MODERATE 04/04/2007  . HAIR LOSS 08/23/2006  . OBESITY NOS 05/28/2006  . FATIGUE 05/28/2006  . GLUCOSE INTOLERANCE 05/23/2006  . Migraine 05/23/2006    Eswin Worrell 01/31/2015, 1:14 PM  [facility] [address] [place], [place], [address] Phone: [phone]   Fax:  [phone]  Name: Hailey Hopkins MRN: [id number] Date of Birth: 1975/06/17    Melvenia Needles, PTA 01/31/2015 1:14 PM Phone: [phone] Fax: [phone]

## 2015-01-31 NOTE — Patient Instructions (Signed)
Keep stretching.  Do home exercises

## 2015-02-02 ENCOUNTER — Ambulatory Visit: Payer: PRIVATE HEALTH INSURANCE | Admitting: Physical Therapy

## 2015-02-02 DIAGNOSIS — M6281 Muscle weakness (generalized): Secondary | ICD-10-CM | POA: Diagnosis not present

## 2015-02-02 DIAGNOSIS — Z9889 Other specified postprocedural states: Secondary | ICD-10-CM

## 2015-02-02 DIAGNOSIS — M25512 Pain in left shoulder: Secondary | ICD-10-CM

## 2015-02-02 DIAGNOSIS — M25612 Stiffness of left shoulder, not elsewhere classified: Secondary | ICD-10-CM

## 2015-02-02 DIAGNOSIS — G8929 Other chronic pain: Secondary | ICD-10-CM

## 2015-02-02 NOTE — Therapy (Signed)
[place], [place], [address] Phone: [phone]   Fax:  [phone]  Physical Therapy Treatment  Patient Details  Name: Hailey Hopkins MRN: [id number] Date of Birth: 1975/07/14 No Data Recorded  Encounter Date: 02/02/2015      PT End of Session - 02/02/15 5102    Visit Number 9   Number of Visits 12   Date for PT Re-Evaluation 02/25/15   PT Start Time 0847   PT Stop Time 0930   PT Time Calculation (min) 43 min   Activity Tolerance Patient tolerated treatment well   Behavior During Therapy [facility] for tasks assessed/performed      Past Medical History  Diagnosis Date  . Diverticular disease   . Depression     Past Surgical History  Procedure Laterality Date  . Abdominal hysterectomy      There were no vitals filed for this visit.  Visit Diagnosis:  Muscle weakness of left arm  Shoulder stiffness, left  Chronic pain in left shoulder  Status post arthroscopy of shoulder  Stiffness of left shoulder joint      Subjective Assessment - 02/02/15 0853    Subjective 4/10 over the counter medication helpful with pain.  Doing about the same.     Currently in Pain? Yes   Pain Score 4    Pain Location Shoulder   Pain Orientation Left                         [facility] Adult PT Treatment/Exercise - 02/02/15 0855    Neck Exercises: Machines for Strengthening   UBE (Upper Arm Bike) L1 X 5 minutes, tried nustep but stopped with increased pain at sternum with shoulder exension.     Shoulder Exercises: Supine   Theraband Level (Shoulder External Rotation) --  10   Flexion AAROM  hands laced 8 reps   Other Supine Exercises supine cane  chest press.     Shoulder Exercises: Seated   Other Seated Exercises UE Ranger,  flexion, horizontal abduction add, circles, multiple reps,  Gradual increase of range.     Shoulder Exercises: Pulleys   Flexion --  5 minutes.   Shoulder Exercises:  ROM/Strengthening   Other ROM/Strengthening Exercises Wall ladder 140  degrees flexion.   Other ROM/Strengthening Exercises UE Rangermultiple positions.     Shoulder Exercises: Stretch   Wall Stretch - Flexion Limitations 27th rung on wall ladder                  PT Short Term Goals - 01/31/15 1311    PT SHORT TERM GOAL #1   Title She will be independent with inital HEP    Status Achieved   PT SHORT TERM GOAL #2   Title Patient will be able to report min improvement in her ability to use L UE for personal care, grooming   Baseline AA arm to fix hair   Time 3   Period Weeks   Status Partially Met   PT SHORT TERM GOAL #3   Title Patient will use RICE, understand self care strategies for home    Baseline understands and uses RICE at home   Time 3   Period Weeks   Status Achieved           PT Long Term Goals - 01/31/15 1312    PT LONG TERM GOAL #1   Title She will be able to do all HEP issued  as of last visit   Time 8   Period Weeks   Status On-going   PT LONG TERM GOAL #2   Title Patient will be able to reach to 130 deg (flexion and abduction) or more with pain minimal (less than 4/10)   Baseline 7/10 pain,   ROM about 100 Actively today   Time 8   Period Weeks   Status On-going   PT LONG TERM GOAL #3   Title Patient will complete ADLs, grooming and dressing without lasting pain   Time 8   Period Weeks   Status On-going   PT LONG TERM GOAL #4   Title Patient will demo strength to 4/5 or more in LUE to allow return to work.    Time 6   Period Weeks   Status Unable to assess   PT LONG TERM GOAL #5   Title She will be able to lift to waist and carry 10 pounds or more to increase home and work participation as MD allows   Time 6   Period Weeks   Status On-going               Plan - 02/02/15 1251    Clinical [place] next visit.  Range gradually improving again.  Patient more focused on home exercise.   PT Next Visit Plan Try  ball on wall, [place]  FOTO   PT Home Exercise Plan continue current.    Consulted and Agree with Plan of Care Patient        Problem List Patient Active Problem List   Diagnosis Date Noted  . Insomnia 10/29/2014  . Plantar fasciitis 12/02/2013  . Benign essential HTN 05/28/2013  . Hepatic steatosis 04/12/2011  . Back pain 06/16/2010  . HEMORRHOIDS, EXTERNAL 12/21/2009  . DEPRESSION, MAJOR, MODERATE 04/04/2007  . HAIR LOSS 08/23/2006  . OBESITY NOS 05/28/2006  . FATIGUE 05/28/2006  . GLUCOSE INTOLERANCE 05/23/2006  . Migraine 05/23/2006    Aviah Sorci 02/02/2015, 12:54 PM  [place], [place], [address] Phone: [phone]   Fax:  [phone]  Name: ANNIYAH MOOD MRN: [id number] Date of Birth: 03/08/1976    Melvenia Needles, PTA 02/02/2015 12:54 PM Phone: [phone] Fax: [phone]

## 2015-02-08 ENCOUNTER — Ambulatory Visit: Payer: PRIVATE HEALTH INSURANCE | Admitting: Physical Therapy

## 2015-02-08 DIAGNOSIS — M6281 Muscle weakness (generalized): Secondary | ICD-10-CM

## 2015-02-08 DIAGNOSIS — M25612 Stiffness of left shoulder, not elsewhere classified: Secondary | ICD-10-CM

## 2015-02-08 DIAGNOSIS — G8929 Other chronic pain: Secondary | ICD-10-CM

## 2015-02-08 DIAGNOSIS — Z9889 Other specified postprocedural states: Secondary | ICD-10-CM

## 2015-02-08 DIAGNOSIS — M25512 Pain in left shoulder: Secondary | ICD-10-CM

## 2015-02-08 NOTE — Therapy (Signed)
[facility] [address] [place], [place], [address] Phone: [phone]   Fax:  [phone]  Physical Therapy Treatment  Patient Details  Name: Hailey Hopkins MRN: [id number] Date of Birth: [birth year] No Data Recorded  Encounter Date: 02/08/2015      PT End of Session - 02/08/15 0930    Visit Number 10   Number of Visits 12   Date for PT Re-Evaluation 02/25/15   PT Start Time 0846   PT Stop Time 0936   PT Time Calculation (min) 50 min   Activity Tolerance Patient tolerated treatment well   Behavior During Therapy [facility] for tasks assessed/performed      Past Medical History  Diagnosis Date  . Diverticular disease   . Depression     Past Surgical History  Procedure Laterality Date  . Abdominal hysterectomy      There were no vitals filed for this visit.  Visit Diagnosis:  Muscle weakness of left arm  Shoulder stiffness, left  Chronic pain in left shoulder  Status post arthroscopy of shoulder  Stiffness of left shoulder joint  Status post subacromial decompression      Subjective Assessment - 02/08/15 0854    Subjective Pt saw MD yesterday, felt he was dismissive of her concerns re: pain.    Currently in Pain? Yes   Pain Score 4    Pain Location Shoulder   Pain Orientation Left   Pain Type Surgical pain   Pain Onset More than a month ago   Pain Frequency Intermittent            [facility] PT Assessment - 02/08/15 0916    AROM   Left Shoulder Flexion 136 Degrees  uses Rt. UE    Left Shoulder ABduction 110 Degrees             [facility] Adult PT Treatment/Exercise - 02/08/15 0901    Shoulder Exercises: Standing   External Rotation Strengthening;Left;20 reps;Theraband   Theraband Level (Shoulder External Rotation) Level 3 (Green)   Internal Rotation Strengthening;20 reps;Theraband   Theraband Level (Shoulder Internal Rotation) Level 3 (Green)   Internal Rotation Weight (lbs)     Flexion  Strengthening;Left;10 reps;Theraband   Theraband Level (Shoulder Flexion) Level 1 (Yellow)   ABduction Strengthening;Left;10 reps   Extension Strengthening;Both;20 reps;Theraband   Theraband Level (Shoulder Extension) Level 3 (Green)   Row Strengthening;Both;20 reps;Theraband   Theraband Level (Shoulder Row) Level 3 (Green)   Shoulder Exercises: ROM/Strengthening   UBE (Upper Arm Bike) 6 min, level 1 FW and backward.    Other ROM/Strengthening Exercises UE ranger flexion with step x 10, abduction/scaption. PT held [facility] junction to ease pain.    Other ROM/Strengthening Exercises abd, add x 10 with UE ranger   Cryotherapy   Number Minutes Cryotherapy 15 Minutes   Cryotherapy Location Shoulder   Type of Cryotherapy Ice pack                PT Education - 02/08/15 0941    Education provided Yes   Education Details kinesiotape   Person(s) Educated Patient   Methods Explanation;Demonstration   Comprehension Verbalized understanding;Returned demonstration          PT Short Term Goals - 02/08/15 0855    PT SHORT TERM GOAL #1   Title She will be independent with inital HEP    Status Achieved   PT SHORT TERM GOAL #2   Title Patient will be able to report min improvement in her ability to  use L UE for personal care, grooming   Status Achieved   PT SHORT TERM GOAL #3   Title Patient will use RICE, understand self care strategies for home    Status Achieved           PT Long Term Goals - 02/08/15 0855    PT LONG TERM GOAL #1   Title She will be able to do all HEP issued as of last visit   Status On-going   PT LONG TERM GOAL #2   Title Patient will be able to reach to 130 deg (flexion and abduction) or more with pain minimal (less than 4/10)   Status On-going   PT LONG TERM GOAL #3   Title Patient will complete ADLs, grooming and dressing without lasting pain   Status On-going   PT LONG TERM GOAL #4   Title Patient will demo strength to 4/5 or more in LUE to allow  return to work.    Status On-going   PT LONG TERM GOAL #5   Title She will be able to lift to waist and carry 10 pounds or more to increase home and work participation as MD allows   Status On-going   PT LONG TERM GOAL #6   Title Pt will score 42% or less on FOTO to demo functional improvement   Status Achieved               Plan - 02/08/15 0938    Clinical Impression Statement FOTO improved by 20%.  Pain limits overhead motion, catching sensation at prox clavicle ([place] junction), improved when pt holds and lifts her arm.  Still not sleeping.  Will benefit from more visits to continue to advance ROM and strength, function.     Pt will benefit from skilled therapeutic intervention in order to improve on the following deficits Decreased activity tolerance;Decreased mobility;Impaired flexibility;Decreased strength;Pain;Increased edema;Impaired UE functional use;Decreased endurance;Increased fascial restricitons;Decreased scar mobility   Rehab Potential Excellent   PT Frequency 2x / week   PT Duration 6 weeks   PT Treatment/Interventions Therapeutic exercise;Patient/family education;Manual techniques;Passive range of motion;Cryotherapy;Ultrasound;Moist Heat;Electrical Stimulation;Dry needling;Iontophoresis /ml Dexamethasone;Taping;Therapeutic activities;Vasopneumatic Device;ADLs/Self Care Home Management;Other (comment)   PT Next Visit Plan try another tape, manual?, cont strength within pain limits.    PT Home Exercise Plan continue current.    Consulted and Agree with Plan of Care Patient        Problem List Patient Active Problem List   Diagnosis Date Noted  . Insomnia 10/29/2014  . Plantar fasciitis 12/02/2013  . Benign essential HTN 05/28/2013  . Hepatic steatosis 04/12/2011  . Back pain 06/16/2010  . HEMORRHOIDS, EXTERNAL 12/21/2009  . DEPRESSION, MAJOR, MODERATE 04/04/2007  . HAIR LOSS 08/23/2006  . OBESITY NOS 05/28/2006  . FATIGUE 05/28/2006  . GLUCOSE INTOLERANCE  05/23/2006  . Migraine 05/23/2006    Hailey Hopkins 02/08/2015, 9:45 AM  [facility] [address] [place], [place], [address] Phone: [phone]   Fax:  [phone]  Name: Hailey Hopkins MRN: [id number] Date of Birth: 09/23/1975  Karie Mainland, PT 02/08/2015 9:46 AM Phone: [phone] Fax: [phone]

## 2015-02-10 ENCOUNTER — Ambulatory Visit: Payer: PRIVATE HEALTH INSURANCE | Admitting: Physical Therapy

## 2015-02-10 DIAGNOSIS — G8929 Other chronic pain: Secondary | ICD-10-CM

## 2015-02-10 DIAGNOSIS — Z9889 Other specified postprocedural states: Secondary | ICD-10-CM

## 2015-02-10 DIAGNOSIS — M6281 Muscle weakness (generalized): Secondary | ICD-10-CM | POA: Diagnosis not present

## 2015-02-10 DIAGNOSIS — M25612 Stiffness of left shoulder, not elsewhere classified: Secondary | ICD-10-CM

## 2015-02-10 DIAGNOSIS — M25512 Pain in left shoulder: Secondary | ICD-10-CM

## 2015-02-10 NOTE — Therapy (Signed)
[facility] [address] [place], [place], [address] Phone: [phone]   Fax:  [phone]  Physical Therapy Treatment  Patient Details  Name: Hailey ButteryFelicia G Hopkins MRN: [id number] Date of Birth: Apr 10, 1975 No Data Recorded  Encounter Date: 02/10/2015      PT End of Session - 02/10/15 1302    Visit Number 11   Number of Visits 12   Date for PT Re-Evaluation 02/25/15   PT Start Time 0848   PT Stop Time 0930   PT Time Calculation (min) 42 min   Activity Tolerance Patient tolerated treatment well   Behavior During Therapy [facility] for tasks assessed/performed      Past Medical History  Diagnosis Date  . Diverticular disease   . Depression     Past Surgical History  Procedure Laterality Date  . Abdominal hysterectomy      There were no vitals filed for this visit.  Visit Diagnosis:  Muscle weakness of left arm  Shoulder stiffness, left  Chronic pain in left shoulder  Status post arthroscopy of shoulder  Stiffness of left shoulder joint  Status post subacromial decompression      Subjective Assessment - 02/10/15 0842    Subjective 2/10 this morning.                           [facility]/Exercise - 02/10/15 0850    Self-Care   Self-Care Heat/Ice Application;Other Self-Care Comments   Other Self-Care Comments  isometrics, trial of figure 8 support simulation to see if helpful sterno clavicular junction.     Neck Exercises: Machines for Strengthening   UBE (Upper Arm Bike) l1.5 5.5 minutes   Shoulder Exercises: Isometric Strengthening   Flexion Limitations 10 X 10   Extension Limitations 10 X 10   External Rotation Limitations 10 X 10   ABduction Limitations 10 X10   Iontophoresis   Type of Iontophoresis Dexamethasone   Location Sternoclavicular junction   Dose 4 mg/ml, 1cc   Time 6 hour patch   Manual Therapy   Other Manual Therapy Mc connel taping to stabilize clavicle,  downward.  did not seem to help                  PT Short Term Goals - 02/08/15 0855    PT SHORT TERM GOAL #1   Title She will be independent with inital HEP    Status Achieved   PT SHORT TERM GOAL #2   Title Patient will be able to report min improvement in her ability to use L UE for personal care, grooming   Status Achieved   PT SHORT TERM GOAL #3   Title Patient will use RICE, understand self care strategies for home    Status Achieved           PT Long Term Goals - 02/08/15 0855    PT LONG TERM GOAL #1   Title She will be able to do all HEP issued as of last visit   Status On-going   PT LONG TERM GOAL #2   Title Patient will be able to reach to 130 deg (flexion and abduction) or more with pain minimal (less than 4/10)   Status On-going   PT LONG TERM GOAL #3   Title Patient will complete ADLs, grooming and dressing without lasting pain   Status On-going   PT LONG TERM GOAL #4   Title Patient will demo strength  to 4/5 or more in LUE to allow return to work.    Status On-going   PT LONG TERM GOAL #5   Title She will be able to lift to waist and carry 10 pounds or more to increase home and work participation as MD allows   Status On-going   PT LONG TERM GOAL #6   Title Pt will score 42% or less on FOTO to demo functional improvement   Status Achieved               Plan - 02/10/15 1302    Clinical Impression Statement Trial and error for pain control.  Exercises within pain tolerance only today.     PT Next Visit Plan see how ionto goes, it was buzzing really hard when she left.  (She is to remove if it was too strong)   PT Home Exercise Plan continue current.    Consulted and Agree with Plan of Care Patient        Problem List Patient Active Problem List   Diagnosis Date Noted  . Insomnia 10/29/2014  . Plantar fasciitis 12/02/2013  . Benign essential HTN 05/28/2013  . Hepatic steatosis 04/12/2011  . Back pain 06/16/2010  . HEMORRHOIDS,  EXTERNAL 12/21/2009  . DEPRESSION, MAJOR, MODERATE 04/04/2007  . HAIR LOSS 08/23/2006  . OBESITY NOS 05/28/2006  . FATIGUE 05/28/2006  . GLUCOSE INTOLERANCE 05/23/2006  . Migraine 05/23/2006    HARRIS,KAREN 02/10/2015, 1:07 PM  [facility] [address] [place], [place], [address] Phone: [phone]   Fax:  [phone]  Name: Hailey Hopkins MRN: [id number] Date of Birth: Jan 08, 1976    Liz Beach, PTA 02/10/2015 1:07 PM Phone: [phone] Fax: [phone]

## 2015-02-16 ENCOUNTER — Ambulatory Visit: Payer: PRIVATE HEALTH INSURANCE | Admitting: Physical Therapy

## 2015-02-16 DIAGNOSIS — M25612 Stiffness of left shoulder, not elsewhere classified: Secondary | ICD-10-CM

## 2015-02-16 DIAGNOSIS — R293 Abnormal posture: Secondary | ICD-10-CM

## 2015-02-16 DIAGNOSIS — G8929 Other chronic pain: Secondary | ICD-10-CM

## 2015-02-16 DIAGNOSIS — M6281 Muscle weakness (generalized): Secondary | ICD-10-CM

## 2015-02-16 DIAGNOSIS — Z9889 Other specified postprocedural states: Secondary | ICD-10-CM

## 2015-02-16 DIAGNOSIS — M25512 Pain in left shoulder: Secondary | ICD-10-CM

## 2015-02-16 NOTE — Therapy (Signed)
[facility] [address] [place], [place], [address] Phone: [phone]   Fax:  [phone]  Physical Therapy Treatment  Patient Details  Name: Larinda ButteryFelicia G Thorley MRN: [id number] Date of Birth: Jul 11, 1975 No Data Recorded  Encounter Date: 02/16/2015      PT End of Session - 02/16/15 0851    Visit Number 12   Number of Visits 12   Date for PT Re-Evaluation 02/25/15   PT Start Time 0845   PT Stop Time 0930   PT Time Calculation (min) 45 min   Activity Tolerance Patient tolerated treatment well   Behavior During Therapy [facility] for tasks assessed/performed      Past Medical History  Diagnosis Date  . Diverticular disease   . Depression     Past Surgical History  Procedure Laterality Date  . Abdominal hysterectomy      There were no vitals filed for this visit.  Visit Diagnosis:  Muscle weakness of left arm  Shoulder stiffness, left  Chronic pain in left shoulder  Status post arthroscopy of shoulder  Stiffness of left shoulder joint  Status post subacromial decompression  Abnormal posture      Subjective Assessment - 02/16/15 0848    Subjective "It hurts a little bit but not too bad" "I've been taking the neproxin like I'm supposed to"   Pain Score 2    Pain Location Shoulder   Pain Orientation Left   Pain Type Surgical pain   Pain Onset More than a month ago   Pain Frequency Intermittent                         [facility] Adult PT Treatment/Exercise - 02/16/15 0911    Neck Exercises: Machines for Strengthening   UBE (Upper Arm Bike) l1.5 5.5 minutes   Shoulder Exercises: Seated   Other Seated Exercises pulleys flexion and scaption x 6 minutes; table slides with pillow case in flexion and scaption x 4 minutes   Shoulder Exercises: Standing   External Rotation Strengthening;Left;20 reps;Theraband   Theraband Level (Shoulder External Rotation) Level 3 (Green)   Internal Rotation  Strengthening;20 reps;Theraband   Theraband Level (Shoulder Internal Rotation) Level 3 (Green)   Internal Rotation Weight (lbs)     Flexion Strengthening;Left;10 reps;Theraband   Theraband Level (Shoulder Flexion) Level 1 (Yellow)   ABduction Strengthening;Left;10 reps   Extension Strengthening;Both;20 reps;Theraband   Theraband Level (Shoulder Extension) Level 3 (Green)   Row Strengthening;Both;20 reps;Theraband   Theraband Level (Shoulder Row) Level 3 (Green)   Other Standing Exercises UE ranger flexion with forward step and scaption x 4 minutes; standing isometrics in all 4 directions 10x10                  PT Short Term Goals - 02/08/15 0855    PT SHORT TERM GOAL #1   Title She will be independent with inital HEP    Status Achieved   PT SHORT TERM GOAL #2   Title Patient will be able to report min improvement in her ability to use L UE for personal care, grooming   Status Achieved   PT SHORT TERM GOAL #3   Title Patient will use RICE, understand self care strategies for home    Status Achieved           PT Long Term Goals - 02/08/15 0855    PT LONG TERM GOAL #1   Title She will be able to do all  HEP issued as of last visit   Status On-going   PT LONG TERM GOAL #2   Title Patient will be able to reach to 130 deg (flexion and abduction) or more with pain minimal (less than 4/10)   Status On-going   PT LONG TERM GOAL #3   Title Patient will complete ADLs, grooming and dressing without lasting pain   Status On-going   PT LONG TERM GOAL #4   Title Patient will demo strength to 4/5 or more in LUE to allow return to work.    Status On-going   PT LONG TERM GOAL #5   Title She will be able to lift to waist and carry 10 pounds or more to increase home and work participation as MD allows   Status On-going   PT LONG TERM GOAL #6   Title Pt will score 42% or less on FOTO to demo functional improvement   Status Achieved               Plan - 02/16/15 1610     Clinical Impression Statement Patient presents with 2/10 pain in the left shoulder and is taking her NSAIDs as needed. She performed all exercises with no increase in pain. Patient experienced a rash from the iontophoresis patch last time and declined it this Tx.   PT Next Visit Plan continue ROM and shoulder strengthening        Problem List Patient Active Problem List   Diagnosis Date Noted  . Insomnia 10/29/2014  . Plantar fasciitis 12/02/2013  . Benign essential HTN 05/28/2013  . Hepatic steatosis 04/12/2011  . Back pain 06/16/2010  . HEMORRHOIDS, EXTERNAL 12/21/2009  . DEPRESSION, MAJOR, MODERATE 04/04/2007  . HAIR LOSS 08/23/2006  . OBESITY NOS 05/28/2006  . FATIGUE 05/28/2006  . GLUCOSE INTOLERANCE 05/23/2006  . Migraine 05/23/2006   Kenney Houseman, SPTA 02/16/2015 9:28 AM PHONE:[phone] [phone]  [facility] [address] [place], [place], [address] Phone: [phone]   Fax:  [phone]  Name: ONIYA MANDARINO MRN: [id number] Date of Birth: 16-Dec-1975

## 2015-02-23 ENCOUNTER — Ambulatory Visit: Payer: PRIVATE HEALTH INSURANCE | Admitting: Physical Therapy

## 2015-02-24 ENCOUNTER — Ambulatory Visit: Payer: Self-pay | Admitting: Family Medicine

## 2015-02-25 ENCOUNTER — Ambulatory Visit: Payer: PRIVATE HEALTH INSURANCE | Attending: Family Medicine | Admitting: Physical Therapy

## 2015-02-25 DIAGNOSIS — M25611 Stiffness of right shoulder, not elsewhere classified: Secondary | ICD-10-CM

## 2015-02-25 DIAGNOSIS — M25612 Stiffness of left shoulder, not elsewhere classified: Secondary | ICD-10-CM | POA: Diagnosis present

## 2015-02-25 DIAGNOSIS — M25511 Pain in right shoulder: Secondary | ICD-10-CM | POA: Diagnosis present

## 2015-02-25 DIAGNOSIS — M6281 Muscle weakness (generalized): Secondary | ICD-10-CM | POA: Diagnosis not present

## 2015-02-25 DIAGNOSIS — M25512 Pain in left shoulder: Secondary | ICD-10-CM | POA: Insufficient documentation

## 2015-02-25 DIAGNOSIS — R293 Abnormal posture: Secondary | ICD-10-CM | POA: Diagnosis present

## 2015-02-25 DIAGNOSIS — Z9889 Other specified postprocedural states: Secondary | ICD-10-CM | POA: Diagnosis present

## 2015-02-25 DIAGNOSIS — G8929 Other chronic pain: Secondary | ICD-10-CM

## 2015-02-25 NOTE — Therapy (Addendum)
[place], [place], [address] Phone: [phone]   Fax:  [phone]  Physical Therapy Treatment / Re-certification  Patient Details  Name: Hailey Hopkins MRN: [id number] Date of Birth: [birth year] No Data Recorded  Encounter Date: 02/25/2015      PT End of Session - 02/25/15 0925    Visit Number 13    Number of Visits 17   Date for PT Re-Evaluation 03/25/15   PT Start Time 0845   PT Stop Time 0930   PT Time Calculation (min) 45 min   Activity Tolerance Patient tolerated treatment well   Behavior During Therapy [facility] for tasks assessed/performed      Past Medical History  Diagnosis Date  . Diverticular disease   . Depression     Past Surgical History  Procedure Laterality Date  . Abdominal hysterectomy      There were no vitals filed for this visit.  Visit Diagnosis:  Muscle weakness of left arm  Shoulder stiffness, left  Chronic pain in left shoulder  Status post arthroscopy of shoulder  Stiffness of left shoulder joint  Status post subacromial decompression  Abnormal posture  Pain in right shoulder  Stiffness of right shoulder joint  Muscle weakness of right arm      Subjective Assessment - 02/25/15 0854    Subjective I'm having trouble sleeping and I've got a Migraine.   Currently in Pain? Yes   Pain Score 2    Pain Location Shoulder   Pain Orientation Left   Pain Type Surgical pain   Pain Onset More than a month ago   Aggravating Factors  sleeping movement, pronation   Pain Relieving Factors rest,            [facility] PT Assessment - 02/25/15 0913    Observation/Other Assessments   Focus on Therapeutic Outcomes (FOTO)  39%   AROM   Left Shoulder Flexion 154 Degrees   Left Shoulder ABduction 105 Degrees   Strength   Strength Assessment Site Shoulder   Right/Left Shoulder Right;Left   Right Shoulder Flexion 4+/5   Right Shoulder Extension 5/5   Right  Shoulder ABduction 5/5   Right Shoulder Internal Rotation 5/5   Right Shoulder External Rotation 5/5   Left Shoulder Flexion 4/5   Left Shoulder Extension 5/5   Left Shoulder ABduction 3+/5  secondary to pain   Left Shoulder Internal Rotation 4+/5   Left Shoulder External Rotation 5/5                     [facility] Adult PT Treatment/Exercise - 02/25/15 0945    Shoulder Exercises: Seated   Other Seated Exercises    Shoulder Exercises: ROM/Strengthening   UBE (Upper Arm Bike) 8 min, level 4 FW and backward.    Other ROM/Strengthening Exercises Pulleys Abduction, Scaption, Flexion    Other ROM/Strengthening Exercises Table slides with a pillowcase flexion, scaption  each way                  PT Short Term Goals - 02/25/15 0935    PT SHORT TERM GOAL #1   Title She will be independent with inital HEP    Baseline can reproduce correctly without cues.   Time 3   Period Weeks   Status Achieved   PT SHORT TERM GOAL #2   Title Patient will be able to report min improvement in her ability to use L UE for personal care, grooming  Baseline AA arm to fix hair   Time 3   Period Weeks   Status Achieved   PT SHORT TERM GOAL #3   Baseline understands and uses RICE at home   Time 3   Period Weeks   Status Achieved           PT Long Term Goals - 02/25/15 0935    PT LONG TERM GOAL #1   Title She will be able to do all HEP issued as of last visit   Time 8   Period Weeks   Status On-going   PT LONG TERM GOAL #2   Title Patient will be able to reach to 130 deg (flexion and abduction) or more with pain minimal (less than 4/10)   Baseline 7/10 pain,   ROM about 100 Actively today  flexion 154, abduction 104   Time 8   Period Weeks   Status Partially Met   PT LONG TERM GOAL #3   Title Patient will complete ADLs, grooming and dressing without lasting pain   Baseline more painful in the last week   Time 8   Period Weeks   Status Achieved   PT LONG TERM GOAL #4    Title Patient will demo strength to 4/5 or more in LUE to allow return to work.    Baseline Pain with endrange left shoulder flexion   Time 6   Period Weeks   Status On-going   PT LONG TERM GOAL #5   Title She will be able to lift to waist and carry 10 pounds or more to increase home and work participation as MD allows   Baseline able to do in clinic    Time 6   Period Weeks   Status On-going   PT LONG TERM GOAL #6   Title Pt will score 42% or less on FOTO to demo functional improvement   Time 6   Period Weeks   Status Achieved               Plan - 02/25/15 0933    Clinical Impression Statement Patient has improved FOTO score from 59 percent to a 39 percent on her FOTO score which means she is becoming much less limited. Strength measurements were taken (see flow chart). She has been reduced to one treatment a week for next four weeks for progress towards goals and independence with home exercise.. Patient has met LTG #3 and 6, and has met all STG.   PT Next Visit Plan continue ROM and shoulder strengthening        Problem List Patient Active Problem List   Diagnosis Date Noted  . Insomnia 10/29/2014  . Plantar fasciitis 12/02/2013  . Benign essential HTN 05/28/2013  . Hepatic steatosis 04/12/2011  . Back pain 06/16/2010  . HEMORRHOIDS, EXTERNAL 12/21/2009  . DEPRESSION, MAJOR, MODERATE 04/04/2007  . HAIR LOSS 08/23/2006  . OBESITY NOS 05/28/2006  . FATIGUE 05/28/2006  . GLUCOSE INTOLERANCE 05/23/2006  . Migraine 05/23/2006   Laury Axon, SPTA 02/25/2015 9:50 AM PHONE:[phone] [facility] [place], [place], [address] Phone: [phone]   Fax:  [phone]  Name: Hailey Hopkins MRN: [id number] Date of Birth: [birth year]  During this treatment session, the therapist was present, participating in and directing the treatment.   Kristoffer Leamon PT, DPT, LAT, ATC   02/25/2015  10:00 AM

## 2015-03-01 ENCOUNTER — Ambulatory Visit: Payer: PRIVATE HEALTH INSURANCE | Admitting: Physical Therapy

## 2015-03-01 DIAGNOSIS — M6281 Muscle weakness (generalized): Secondary | ICD-10-CM

## 2015-03-01 DIAGNOSIS — M25612 Stiffness of left shoulder, not elsewhere classified: Secondary | ICD-10-CM

## 2015-03-01 NOTE — Therapy (Signed)
[facility] [address] [place], [place], [address] Phone: [phone]   Fax:  [phone]  Physical Therapy Treatment  Patient Details  Name: Hailey Hopkins MRN: [id number] Date of Birth: 15-Jan-1976 No Data Recorded  Encounter Date: 03/01/2015      PT End of Session - 03/01/15 0924    Visit Number 14   Number of Visits 17   Date for PT Re-Evaluation 03/25/15   PT Start Time 0848   PT Stop Time 0928   PT Time Calculation (min) 40 min   Activity Tolerance Patient tolerated treatment well;No increased pain   Behavior During Therapy [facility] for tasks assessed/performed      Past Medical History  Diagnosis Date  . Diverticular disease   . Depression     Past Surgical History  Procedure Laterality Date  . Abdominal hysterectomy      There were no vitals filed for this visit.  Visit Diagnosis:  Muscle weakness of left arm  Shoulder stiffness, left  Stiffness of left shoulder joint      Subjective Assessment - 03/01/15 0851    Subjective Collar bone pain gets worse when she lifts arm higher.    Shoulder a 2.  No Migraine.  No radiating arm pain today.  Slept 2.5 hours last night with [place] pm.   Currently in Pain? Yes   Pain Score 2    Pain Location Shoulder   Pain Orientation Left   Pain Radiating Towards clavicle   Aggravating Factors  reaching, sleeping on it    Pain Relieving Factors preone sleeping hanging it off the edge of the bed.   Multiple Pain Sites No                         [facility] Adult PT Treatment/Exercise - 03/01/15 0855    Elbow Exercises   Elbow Flexion --  5 lbs 20 X supine,  Triceps 10 X 4 LBS elbow assist   Shoulder Exercises: Standing   Row Strengthening   Theraband Level (Shoulder Row) Level 3 (Green)  10 X  guarded initially   Other Standing Exercises UE ranger, ball on wall, body blade flexion, IR// ER adduction abduction,30 to 60 seconds each  wall push up 10 X    Shoulder Exercises: Isometric Strengthening   ABduction Limitations 10 X 10                  PT Short Term Goals - 02/25/15 0935    PT SHORT TERM GOAL #1   Title She will be independent with inital HEP    Baseline can reproduce correctly without cues.   Time 3   Period Weeks   Status Achieved   PT SHORT TERM GOAL #2   Title Patient will be able to report min improvement in her ability to use L UE for personal care, grooming   Baseline AA arm to fix hair   Time 3   Period Weeks   Status Achieved   PT SHORT TERM GOAL #3   Baseline understands and uses RICE at home   Time 3   Period Weeks   Status Achieved           PT Long Term Goals - 03/01/15 [id number]    PT LONG TERM GOAL #1   Time 8   Period Weeks   Status On-going   PT LONG TERM GOAL #2   Title Patient will be able to  reach to 130 deg (flexion and abduction) or more with pain minimal (less than 4/10)   Baseline 110 today   Time 8   Period Weeks   Status On-going   PT LONG TERM GOAL #3   Title Patient will complete ADLs, grooming and dressing without lasting pain   Time 8   Period Weeks   Status Unable to assess   PT LONG TERM GOAL #4   Title Patient will demo strength to 4/5 or more in LUE to allow return to work.    Time 6   Period Weeks   Status Unable to assess   PT LONG TERM GOAL #5   Title She will be able to lift to waist and carry 10 pounds or more to increase home and work participation as MD allows   Time 6   Period Weeks   Status Unable to assess               Plan - 03/01/15 0925    Clinical Impression Statement strengthening focus..  AROM 110 today less than last visit.  (154)  for flexion.  No pain increase at end of session the collar bone pain limits some exercises.        Problem List Patient Active Problem List   Diagnosis Date Noted  . Insomnia 10/29/2014  . Plantar fasciitis 12/02/2013  . Benign essential HTN 05/28/2013  . Hepatic steatosis 04/12/2011  . Back  pain 06/16/2010  . HEMORRHOIDS, EXTERNAL 12/21/2009  . DEPRESSION, MAJOR, MODERATE 04/04/2007  . HAIR LOSS 08/23/2006  . OBESITY NOS 05/28/2006  . FATIGUE 05/28/2006  . GLUCOSE INTOLERANCE 05/23/2006  . Migraine 05/23/2006    Hailey Hopkins 03/01/2015, 9:30 AM  [facility] [address] [place], [place], [address] Phone: [phone]   Fax:  [phone]  Name: Hailey Hopkins MRN: [id number] Date of Birth: October 16, 1975    Liz Beach, PTA 03/01/2015 9:30 AM Phone: [phone] Fax: [phone]

## 2015-03-04 ENCOUNTER — Encounter: Payer: Self-pay | Admitting: Physical Therapy

## 2015-03-08 ENCOUNTER — Ambulatory Visit: Payer: PRIVATE HEALTH INSURANCE | Admitting: Physical Therapy

## 2015-03-08 DIAGNOSIS — M6281 Muscle weakness (generalized): Secondary | ICD-10-CM

## 2015-03-08 DIAGNOSIS — M25612 Stiffness of left shoulder, not elsewhere classified: Secondary | ICD-10-CM

## 2015-03-08 DIAGNOSIS — M25512 Pain in left shoulder: Secondary | ICD-10-CM

## 2015-03-08 DIAGNOSIS — G8929 Other chronic pain: Secondary | ICD-10-CM

## 2015-03-08 NOTE — Therapy (Signed)
[facility] [address] [place], [place], [address] Phone: [phone]   Fax:  [phone]  Physical Therapy Treatment  Patient Details  Name: Hailey Hopkins MRN: [id number] Date of Birth: 1975/07/15 No Data Recorded  Encounter Date: 03/08/2015      PT End of Session - 03/08/15 1251    Visit Number 15   Number of Visits 17   Date for PT Re-Evaluation 03/25/15   PT Start Time 1145   PT Stop Time 1240   PT Time Calculation (min) 55 min   Activity Tolerance Patient tolerated treatment well   Behavior During Therapy [facility] for tasks assessed/performed      Past Medical History  Diagnosis Date  . Diverticular disease   . Depression     Past Surgical History  Procedure Laterality Date  . Abdominal hysterectomy      There were no vitals filed for this visit.  Visit Diagnosis:  Muscle weakness of left arm  Shoulder stiffness, left  Stiffness of left shoulder joint  Chronic pain in left shoulder      Subjective Assessment - 03/08/15 1154    Subjective "over the weekend I had alot of soreness in the shoulder, which she reports she is unsure of what caused it"    Currently in Pain? Yes   Pain Score 2   last took naproxen 5:30am   Pain Location Shoulder   Pain Orientation Left   Pain Type Surgical pain   Pain Onset More than a month ago   Pain Frequency Intermittent   Aggravating Factors  reaching/ laying down   Pain Relieving Factors sleeping prone,             [facility] PT Assessment - 03/08/15 0001    AROM   Left Shoulder Extension 40 Degrees   Left Shoulder Flexion 125 Degrees   Left Shoulder ABduction 94 Degrees   Left Shoulder Internal Rotation 70 Degrees   Strength   Right Shoulder Flexion 4+/5   Right Shoulder Extension 5/5   Right Shoulder ABduction 5/5   Right Shoulder Internal Rotation 5/5   Right Shoulder External Rotation 5/5   Left Shoulder Flexion 4/5   Left Shoulder Extension 5/5   Left Shoulder ABduction 3+/5   Left Shoulder Internal Rotation 4+/5   Left Shoulder External Rotation 5/5                     [facility] Adult PT Treatment/Exercise - 03/08/15 1159    Shoulder Exercises: Seated   Retraction AROM;Strengthening;Both;10 reps   Shoulder Exercises: Pulleys   Flexion 2 minutes   ABduction 2 minutes   Shoulder Exercises: ROM/Strengthening   UBE (Upper Arm Bike) L1 x 7 min  alt direction every    Shoulder Exercises: Stretch   Other Shoulder Stretches SCM stretching 2 x 30 sec   Moist Heat Therapy   Number Minutes Moist Heat 10 Minutes   Moist Heat Location Other (comment)  shoulder/ cervical region on L   Manual Therapy   Soft tissue mobilization Soft tissue rolling, and stretching of SCM   Myofascial Release manual trigger point release of distal/ proximal ends of SCM   Neck Exercises: Stretches   Upper Trapezius Stretch 1 rep;30 seconds          Trigger Point Dry Needling - 03/08/15 1246    Consent Given? Yes   Education Handout Provided --  provided previously   Muscles Treated Upper Body Sternocleidomastoid   Sternocleidomastoid  Response Twitch response elicited;Palpable increased muscle length              PT Education - 03/08/15 1251    Education provided Yes   Education Details anatomy of the Sternocliedomasstoid muscle   Person(s) Educated Patient   Methods Explanation   Comprehension Verbalized understanding          PT Short Term Goals - 02/25/15 0935    PT SHORT TERM GOAL #1   Title She will be independent with inital HEP    Baseline can reproduce correctly without cues.   Time 3   Period Weeks   Status Achieved   PT SHORT TERM GOAL #2   Title Patient will be able to report min improvement in her ability to use L UE for personal care, grooming   Baseline AA arm to fix hair   Time 3   Period Weeks   Status Achieved   PT SHORT TERM GOAL #3   Baseline understands and uses RICE at home   Time 3   Period  Weeks   Status Achieved           PT Long Term Goals - 03/08/15 1255    PT LONG TERM GOAL #1   Title She will be able to do all HEP issued as of last visit   Time 8   Period Weeks   Status On-going   PT LONG TERM GOAL #2   Title Patient will be able to reach to 130 deg (flexion and abduction) or more with pain minimal (less than 4/10)   Baseline 110 today   Time 8   Period Weeks   Status On-going   PT LONG TERM GOAL #3   Title Patient will complete ADLs, grooming and dressing without lasting pain   Baseline more painful in the last week   Time 8   Period Weeks   Status On-going   PT LONG TERM GOAL #4   Title Patient will demo strength to 4/5 or more in LUE to allow return to work.    Baseline Pain with endrange left shoulder flexion   Time 6   Period Weeks   Status On-going   PT LONG TERM GOAL #5   Title She will be able to lift to waist and carry 10 pounds or more to increase home and work participation as MD allows   Baseline able to do in clinic    Time 6   Period Weeks   Status On-going   PT LONG TERM GOAL #6   Title Pt will score 42% or less on FOTO to demo functional improvement   Time 6   Period Weeks   Status Achieved               Plan - 03/08/15 1252    Clinical Impression Statement Hailey Hopkins continues to report pain inthe proximal clavicle that is worse with shoulder and head movement. The SCM demonstrates tightness with pain during stretching of the L SCM that radiates to the Clavical. she provided consent for DN of the SCM which multiple twtiches were elicited. opted for MHP following todays session to reduce soreness from DN.  pt reports she plans to see the doctor tomorrow.    PT Next Visit Plan assess response to SCM dry needling, continue ROM and shoulder strengthening, assess response from phsyician.    Consulted and Agree with Plan of Care Patient        Problem List Patient Active Problem List  Diagnosis Date Noted  . Insomnia  10/29/2014  . Plantar fasciitis 12/02/2013  . Benign essential HTN 05/28/2013  . Hepatic steatosis 04/12/2011  . Back pain 06/16/2010  . HEMORRHOIDS, EXTERNAL 12/21/2009  . DEPRESSION, MAJOR, MODERATE 04/04/2007  . HAIR LOSS 08/23/2006  . OBESITY NOS 05/28/2006  . FATIGUE 05/28/2006  . GLUCOSE INTOLERANCE 05/23/2006  . Migraine 05/23/2006   Lulu RidingKristoffer Ky Moskowitz PT, DPT, LAT, ATC  03/08/2015  12:57 PM     [facility] [address] [place], [place], [address] Phone: [phone]   Fax:  [phone]  Name: Hailey Hopkins MRN: [id number] Date of Birth: Jun 30, 1975

## 2015-03-15 ENCOUNTER — Ambulatory Visit: Payer: PRIVATE HEALTH INSURANCE | Admitting: Physical Therapy

## 2015-03-22 ENCOUNTER — Ambulatory Visit: Payer: PRIVATE HEALTH INSURANCE | Admitting: Physical Therapy

## 2015-03-22 DIAGNOSIS — G8929 Other chronic pain: Secondary | ICD-10-CM

## 2015-03-22 DIAGNOSIS — M6281 Muscle weakness (generalized): Secondary | ICD-10-CM

## 2015-03-22 DIAGNOSIS — M25612 Stiffness of left shoulder, not elsewhere classified: Secondary | ICD-10-CM

## 2015-03-22 DIAGNOSIS — M25512 Pain in left shoulder: Secondary | ICD-10-CM

## 2015-03-22 NOTE — Therapy (Addendum)
[place], [place], [address] Phone: [phone]   Fax:  [phone]  Physical Therapy Treatment / progress note  Patient Details  Name: Hailey Hopkins MRN: [id number] Date of Birth: Feb 26, 1976 No Data Recorded  Encounter Date: 03/22/2015      PT End of Session - 03/22/15 1002    Visit Number 16   Number of Visits 17   Date for PT Re-Evaluation 03/25/15   PT Start Time 0930   PT Stop Time 1000   PT Time Calculation (min) 30 min   Activity Tolerance Patient tolerated treatment well   Behavior During Therapy [facility] for tasks assessed/performed      Past Medical History  Diagnosis Date  . Diverticular disease   . Depression     Past Surgical History  Procedure Laterality Date  . Abdominal hysterectomy      There were no vitals filed for this visit.  Visit Diagnosis:  Muscle weakness of left arm  Shoulder stiffness, left  Stiffness of left shoulder joint  Chronic pain in left shoulder      Subjective Assessment - 03/22/15 0934    Subjective "I am still, feeling the same pain. with swelling that continues to go into my hand.    Currently in Pain? Yes   Pain Score 2   last took naproxen at 7:30 am   Pain Location Shoulder   Pain Orientation Left   Pain Type Surgical pain   Pain Onset More than a month ago   Pain Frequency Intermittent   Aggravating Factors  reaching, laying down, any movement   Pain Relieving Factors Naproxen            [facility] PT Assessment - 03/22/15 0942    Observation/Other Assessments   Focus on Therapeutic Outcomes (FOTO)  33% limited   AROM   Left Shoulder Extension 44 Degrees   Left Shoulder Flexion 112 Degrees  increased pain to a 6/10 during/ following movement   Left Shoulder ABduction 96 Degrees  pain increased to a 6/10   Left Shoulder Internal Rotation --  to stomach   Left Shoulder External Rotation 60 Degrees  4/10 pain wiht pulling at  the greater tubercle during moveme   Strength   Left Shoulder Flexion 4-/5   Left Shoulder Extension 5/5   Left Shoulder ABduction 3+/5   Left Shoulder Internal Rotation 4+/5   Left Shoulder External Rotation 4-/5   Palpation   Palpation comment tenderness along the clavical with increased point tenderness along the medial aspect, and tightness into the SCM and surrounding musculature. tenderness at the greater tubercle                     [facility] Adult PT Treatment/Exercise - 03/22/15 0001    Self-Care   Other Self-Care Comments  anatomy education of shoulder including rotator cuff, clavicle, and surrounding structures                PT Education - 03/22/15 1001    Education provided Yes   Education Details POC and HEP review   Person(s) Educated Patient   Methods Explanation   Comprehension Verbalized understanding          PT Short Term Goals - 02/25/15 0935    PT SHORT TERM GOAL #1   Title She will be independent with inital HEP    Baseline can reproduce correctly without cues.   Time 3   Period Weeks  Status Achieved   PT SHORT TERM GOAL #2   Title Patient will be able to report min improvement in her ability to use L UE for personal care, grooming   Baseline AA arm to fix hair   Time 3   Period Weeks   Status Achieved   PT SHORT TERM GOAL #3   Baseline understands and uses RICE at home   Time 3   Period Weeks   Status Achieved           PT Long Term Goals - 03/22/15 1008    PT LONG TERM GOAL #1   Title She will be able to do all HEP issued as of last visit   Time 8   Period Weeks   Status On-going   PT LONG TERM GOAL #2   Title Patient will be able to reach to 130 deg (flexion and abduction) or more with pain minimal (less than 4/10)   Time 8   Period Weeks   Status On-going   PT LONG TERM GOAL #3   Title Patient will complete ADLs, grooming and dressing without lasting pain   Baseline more painful in the last week   Time 8    Period Weeks   Status On-going   PT LONG TERM GOAL #4   Title Patient will demo strength to 4/5 or more in LUE to allow return to work.    Baseline Pain with endrange left shoulder flexion   Time 6   Period Weeks   Status On-going   PT LONG TERM GOAL #5   Title She will be able to lift to waist and carry 10 pounds or more to increase home and work participation as MD allows   Baseline able to do in clinic    Time 6   Period Weeks   Status On-going   PT LONG TERM GOAL #6   Title Pt will score 42% or less on FOTO to demo functional improvement   Time 6   Period Weeks   Status Achieved               Plan - 03/22/15 O[place] continues to demonstrate pain in the L shoulder into the clavicle to the [place] joint. She has demonstrated regression of strength and AROM with pain during and following assessment especially with testing of flexion/ abudction and external rotation. She reports that nothing seems to give her relief and everything seems to bother her shoulder. Discussed with pt that at this point due to regression of symptoms and function that it would be in her best interest to return her physician for further assessment. held off on exercises today based on findings, and pt's response during re-assessment. pt reported that she plans to get a second opinion, and would like to hold off being discharged until she gets her second opinion.    PT Home Exercise Plan HEP review   Consulted and Agree with Plan of Care Patient        Problem List Patient Active Problem List   Diagnosis Date Noted  . Insomnia 10/29/2014  . Plantar fasciitis 12/02/2013  . Benign essential HTN 05/28/2013  . Hepatic steatosis 04/12/2011  . Back pain 06/16/2010  . HEMORRHOIDS, EXTERNAL 12/21/2009  . DEPRESSION, MAJOR, MODERATE 04/04/2007  . HAIR LOSS 08/23/2006  . OBESITY NOS 05/28/2006  . FATIGUE 05/28/2006  . GLUCOSE INTOLERANCE 05/23/2006  . Migraine  05/23/2006   Milliana Reddoch PT, DPT, LAT,  ATC  03/22/2015  10:15 AM     [facility] [address] [place], [place], [address] Phone: [phone]   Fax:  [phone]  Name: Hailey Hopkins MRN: [id number] Date of Birth: 11/27/1975    PHYSICAL THERAPY DISCHARGE SUMMARY  Visits from Start of Care: 16  Current functional level related to goals / functional outcomes: See goals   Remaining deficits: Unknown due to pt not returning   Education / Equipment: HEP, theraband  Plan:                                                    Patient goals were not met. Patient is being discharged due to not returning since the last visit.  ?????       Ramaya Guile PT, DPT, LAT, ATC  05/09/2015  9:58 AM

## 2015-03-29 ENCOUNTER — Ambulatory Visit: Payer: PRIVATE HEALTH INSURANCE | Admitting: Physical Therapy

## 2015-04-24 ENCOUNTER — Encounter (HOSPITAL_COMMUNITY): Payer: Self-pay | Admitting: *Deleted

## 2015-04-24 ENCOUNTER — Emergency Department (HOSPITAL_COMMUNITY): Payer: 59

## 2015-04-24 ENCOUNTER — Emergency Department (HOSPITAL_COMMUNITY)
Admission: EM | Admit: 2015-04-24 | Discharge: 2015-04-24 | Disposition: A | Discharge status: Home / Self Care | Payer: 59 | Attending: Emergency Medicine | Admitting: Emergency Medicine

## 2015-04-24 DIAGNOSIS — Z79899 Other long term (current) drug therapy: Secondary | ICD-10-CM | POA: Insufficient documentation

## 2015-04-24 DIAGNOSIS — N23 Unspecified renal colic: Secondary | ICD-10-CM | POA: Diagnosis not present

## 2015-04-24 DIAGNOSIS — F329 Major depressive disorder, single episode, unspecified: Secondary | ICD-10-CM | POA: Diagnosis not present

## 2015-04-24 DIAGNOSIS — N132 Hydronephrosis with renal and ureteral calculous obstruction: Secondary | ICD-10-CM | POA: Diagnosis not present

## 2015-04-24 DIAGNOSIS — R10811 Right upper quadrant abdominal tenderness: Secondary | ICD-10-CM | POA: Diagnosis not present

## 2015-04-24 DIAGNOSIS — K8070 Calculus of gallbladder and bile duct without cholecystitis without obstruction: Secondary | ICD-10-CM | POA: Insufficient documentation

## 2015-04-24 DIAGNOSIS — Z880 Allergy status to penicillin: Secondary | ICD-10-CM | POA: Insufficient documentation

## 2015-04-24 DIAGNOSIS — Z3202 Encounter for pregnancy test, result negative: Secondary | ICD-10-CM | POA: Insufficient documentation

## 2015-04-24 DIAGNOSIS — Z791 Long term (current) use of non-steroidal anti-inflammatories (NSAID): Secondary | ICD-10-CM | POA: Diagnosis not present

## 2015-04-24 DIAGNOSIS — R1011 Right upper quadrant pain: Secondary | ICD-10-CM | POA: Diagnosis present

## 2015-04-24 DIAGNOSIS — K760 Fatty (change of) liver, not elsewhere classified: Secondary | ICD-10-CM | POA: Diagnosis not present

## 2015-04-24 DIAGNOSIS — R109 Unspecified abdominal pain: Secondary | ICD-10-CM

## 2015-04-24 LAB — CBC WITH DIFFERENTIAL/PLATELET
Basophils Absolute: 0 10*3/uL (ref 0.0–0.1)
Basophils Relative: 1 %
Eosinophils Absolute: 0.1 10*3/uL (ref 0.0–0.7)
Eosinophils Relative: 1 %
HEMATOCRIT: 41.4 % (ref 36.0–46.0)
HEMOGLOBIN: 14.5 g/dL (ref 12.0–15.0)
LYMPHS PCT: 32 %
Lymphs Abs: 2.3 10*3/uL (ref 0.7–4.0)
MCH: 31.7 pg (ref 26.0–34.0)
MCHC: 35 g/dL (ref 30.0–36.0)
MCV: 90.6 fL (ref 78.0–100.0)
MONOS PCT: 7 %
Monocytes Absolute: 0.5 10*3/uL (ref 0.1–1.0)
NEUTROS ABS: 4.2 10*3/uL (ref 1.7–7.7)
NEUTROS PCT: 59 %
Platelets: 268 10*3/uL (ref 150–400)
RBC: 4.57 MIL/uL (ref 3.87–5.11)
RDW: 12.2 % (ref 11.5–15.5)
WBC: 7.1 10*3/uL (ref 4.0–10.5)

## 2015-04-24 LAB — URINALYSIS, ROUTINE W REFLEX MICROSCOPIC
Bilirubin Urine: NEGATIVE
GLUCOSE, UA: NEGATIVE mg/dL
Ketones, ur: NEGATIVE mg/dL
LEUKOCYTES UA: NEGATIVE
Nitrite: NEGATIVE
PH: 6 (ref 5.0–8.0)
Protein, ur: NEGATIVE mg/dL
SPECIFIC GRAVITY, URINE: 1.015 (ref 1.005–1.030)

## 2015-04-24 LAB — HEPATIC FUNCTION PANEL
ALT: 21 U/L (ref 14–54)
AST: 25 U/L (ref 15–41)
Albumin: 4 g/dL (ref 3.5–5.0)
Alkaline Phosphatase: 58 U/L (ref 38–126)
BILIRUBIN DIRECT: 0.2 mg/dL (ref 0.1–0.5)
BILIRUBIN INDIRECT: 0.5 mg/dL (ref 0.3–0.9)
Total Bilirubin: 0.7 mg/dL (ref 0.3–1.2)
Total Protein: 7.6 g/dL (ref 6.5–8.1)

## 2015-04-24 LAB — LIPASE, BLOOD: Lipase: 37 U/L (ref 11–51)

## 2015-04-24 LAB — URINE MICROSCOPIC-ADD ON

## 2015-04-24 LAB — POC URINE PREG, ED: PREG TEST UR: NEGATIVE

## 2015-04-24 LAB — BASIC METABOLIC PANEL
Anion gap: 9 (ref 5–15)
BUN: 11 mg/dL (ref 6–20)
CO2: 21 mmol/L — ABNORMAL LOW (ref 22–32)
CREATININE: 1.03 mg/dL — AB (ref 0.44–1.00)
Calcium: 9 mg/dL (ref 8.9–10.3)
Chloride: 108 mmol/L (ref 101–111)
GFR calc Af Amer: >60 mL/min (ref >60)
Glucose, Bld: 130 mg/dL — ABNORMAL HIGH (ref 65–99)
POTASSIUM: 3.7 mmol/L (ref 3.5–5.1)
SODIUM: 138 mmol/L (ref 135–145)

## 2015-04-24 MED ORDER — MORPHINE SULFATE (PF) 4 MG/ML IV SOLN
4.0000 mg | Freq: Once | INTRAVENOUS | Status: AC
  Administered 2015-04-24: 4 mg via INTRAVENOUS
  Filled 2015-04-24: qty 1

## 2015-04-24 MED ORDER — HYDROCODONE-ACETAMINOPHEN 5-325 MG PO TABS: 2.0000 | ORAL_TABLET | Freq: Four times a day (QID) | ORAL | Status: AC | PRN

## 2015-04-24 MED ORDER — ONDANSETRON HCL 4 MG/2ML IJ SOLN
4.0000 mg | Freq: Once | INTRAMUSCULAR | Status: AC
  Administered 2015-04-24: 4 mg via INTRAVENOUS
  Filled 2015-04-24: qty 2

## 2015-04-24 MED ORDER — SODIUM CHLORIDE 0.9 % IV BOLUS (SEPSIS)
1000.0000 mL | Freq: Once | INTRAVENOUS | Status: AC
  Administered 2015-04-24: 1000 mL via INTRAVENOUS

## 2015-04-24 MED ORDER — TAMSULOSIN HCL 0.4 MG PO CAPS: 0.4000 mg | ORAL_CAPSULE | Freq: Every day | ORAL | Status: AC

## 2015-04-24 NOTE — Discharge Instructions (Signed)
Take motrin 800 mg every 6 hrs for pain.   Take vicodin for severe pain.   Take flomax daily.   See surgery for gallstones.   See urologist for kidney stone.   Return to ER if you have severe abdominal pain, vomiting, fever.

## 2015-04-24 NOTE — ED Provider Notes (Signed)
CSN: [id number]     Arrival date & time 04/24/15  1843 History   First MD Initiated Contact with Patient 04/24/15 1912     Chief Complaint  Patient presents with  . Abdominal Pain     (Consider location/radiation/quality/duration/timing/severity/associated sxs/prior Treatment) The history is provided by the patient.  Hailey Hopkins is a 40 y.o. female hx of depression, hysterectomy who presented with right upper quadrant pain. She had dinner around 4 PM and had some peas and fried chicken. She was resting and around 6 pm, had acute onset of right upper quadrant pain that is sharp and radiated to her back. She then had several episodes of vomiting. Denies any diarrhea or fevers. Denies any urinary symptoms or constipation or diarrhea. Denies hx of appendectomy or cholecystectomy.    Past Medical History  Diagnosis Date  . Diverticular disease   . Depression    Past Surgical History  Procedure Laterality Date  . Abdominal hysterectomy    . Rotator cuff repair     No family history on file. Social History  Substance Use Topics  . Smoking status: Never Smoker   . Smokeless tobacco: Never Used  . Alcohol Use: Yes     Comment: Once a month   OB History    No data available     Review of Systems  Gastrointestinal: Positive for vomiting and abdominal pain.  All other systems reviewed and are negative.     Allergies  Penicillins  Home Medications   Prior to Admission medications   Medication Sig Start Date End Date Taking? Authorizing Provider  acetaminophen (TYLENOL) 500 MG tablet Take 1,000 mg by mouth every 6 (six) hours as needed. For pain.   Yes Historical Provider, MD  buPROPion (WELLBUTRIN XL) 300 MG 24 hr tablet Take 1 tablet (300 mg total) by mouth every morning. 12/02/13  Yes Elenora Gamma, MD  hydrochlorothiazide (MICROZIDE) 12.5 MG capsule Take 1 capsule (12.5 mg total) by mouth daily. 05/28/13  Yes Elenora Gamma, MD  loratadine (CLARITIN) 10 MG tablet  Take 1 tablet (10 mg total) by mouth daily. Patient taking differently: Take 10 mg by mouth daily as needed for allergies.  08/22/12  Yes Josalyn Funches, MD  meloxicam (MOBIC) 7.5 MG tablet Take 7.5 mg by mouth daily.   Yes Historical Provider, MD  methocarbamol (ROBAXIN) 500 MG tablet Take 500 mg by mouth 2 (two) times daily as needed. Muscle spasms 01/24/15  Yes Historical Provider, MD  naproxen (NAPROSYN) 500 MG tablet Take 500 mg by mouth 2 (two) times daily as needed for moderate pain.   Yes Historical Provider, MD  topiramate (TOPAMAX) 100 MG tablet Take 1 tablet (100 mg total) by mouth daily. 12/03/13  Yes Elenora Gamma, MD  zolpidem (AMBIEN) 10 MG tablet Take 10 mg by mouth at bedtime as needed for sleep.   Yes Historical Provider, MD  HYDROcodone-acetaminophen (NORCO/VICODIN) 5-325 MG tablet Take 2 tablets by mouth every 6 (six) hours as needed. 04/24/15   Richardean Canal, MD  tamsulosin (FLOMAX) 0.4 MG CAPS capsule Take 1 capsule (0.4 mg total) by mouth daily. 04/24/15   Richardean Canal, MD  zolpidem (AMBIEN) 5 MG tablet Take 1 tablet (5 mg total) by mouth at bedtime as needed for sleep. Patient not taking: Reported on 04/24/2015 10/27/14   Kathee Delton, MD   BP 126/72 mmHg  Pulse 82  Temp(Src) 98.5 F (36.9 C) (Oral)  Resp 18  SpO2 97% Physical Exam  Constitutional: She is oriented to person, place, and time.  Uncomfortable   HENT:  Head: Normocephalic.  Mouth/Throat: Oropharynx is clear and moist.  Eyes: Conjunctivae are normal. Pupils are equal, round, and reactive to light.  Neck: Normal range of motion. Neck supple.  Cardiovascular: Normal rate, regular rhythm and normal heart sounds.   Pulmonary/Chest: Effort normal and breath sounds normal. No respiratory distress. She has no wheezes. She has no rales.  Abdominal: Soft. Bowel sounds are normal.  + RUQ tenderness, mild murphy's sign. No CVAT   Musculoskeletal: Normal range of motion. She exhibits no edema or tenderness.   Neurological: She is alert and oriented to person, place, and time.  Skin: Skin is warm.  Psychiatric: She has a normal mood and affect. Her behavior is normal. Judgment and thought content normal.  Nursing note and vitals reviewed.   ED Course  Procedures (including critical care time) Labs Review Labs Reviewed  URINALYSIS, ROUTINE W REFLEX MICROSCOPIC (NOT AT [facility]) - Abnormal; Notable for the following:    APPearance CLOUDY (*)    Hgb urine dipstick MODERATE (*)    All other components within normal limits  BASIC METABOLIC PANEL - Abnormal; Notable for the following:    CO2 21 (*)    Glucose, Bld 130 (*)    Creatinine, Ser 1.03 (*)    All other components within normal limits  URINE MICROSCOPIC-ADD ON - Abnormal; Notable for the following:    Squamous Epithelial / LPF 6-30 (*)    Bacteria, UA MANY (*)    All other components within normal limits  CBC WITH DIFFERENTIAL/PLATELET  HEPATIC FUNCTION PANEL  LIPASE, BLOOD  POC URINE PREG, ED    Imaging Review US Abdomen Limited  04/24/2015  CLINICAL DATA:  Acute onset of right upper quadrant pain for 1 hour. EXAM: US ABDOMEN LIMITED - RIGHT UPPER QUADRANT COMPARISON:  CT 04/04/2012 FINDINGS: Gallbladder: Partially distended. No gallstones or wall thickening visualized, wall thickness of 2 mm. Probable intraluminal sludge. No sonographic Murphy sign noted by sonographer. Common bile duct: Diameter: 3 mm, normal. Liver: No focal lesion identified. Diffusely increased and heterogeneous in parenchymal echogenicity. Normal directional flow in the main portal vein. IMPRESSION: 1. Probable gallbladder sludge, no gallstones or findings of acute cholecystitis. Partially distended gallbladder is likely secondary to recent (2 hours) p.o. intake. 2. Hepatic steatosis. Electronically Signed   By: Rubye Oaks M.D.   On: 04/24/2015 20:17   Ct Renal Stone Study  04/24/2015  CLINICAL DATA:  Sudden onset of right flank pain today. EXAM: CT ABDOMEN  AND PELVIS WITHOUT CONTRAST TECHNIQUE: Multidetector CT imaging of the abdomen and pelvis was performed following the standard protocol without IV contrast. COMPARISON:  None. FINDINGS: Lower chest:  The included lung bases are clear. Liver: Decreased density consistent with steatosis. No focal lesion allowing for lack contrast. Hepatobiliary: Gallbladder physiologically distended, no calcified stone. No biliary dilatation. Pancreas: No ductal dilatation or inflammation. Spleen: Normal. Adrenal glands: No nodule. Kidneys: Obstructing 3 mm stone at the right ureterovesicular junction with moderate proximal hydroureteronephrosis. Least 4 additional nonobstructing punctate stones are seen in the right kidney. No left hydronephrosis, least 2 punctate nonobstructing stones are seen in the left kidney. Stomach/Bowel: Stomach distended with ingested contents. There are no dilated or thickened small bowel loops. Small-moderate volume of stool throughout the colon without colonic wall thickening. Minimal diverticulosis of the sigmoid colon without diverticulitis. The appendix is normal. Vascular/Lymphatic: No retroperitoneal adenopathy. Abdominal aorta is normal in caliber. Reproductive: Uterus  is surgically absent. Ovaries tentatively identified and normal in size. Bladder: Physiologically distended. Other: No free air, free fluid, or intra-abdominal fluid collection. Musculoskeletal: There are no acute or suspicious osseous abnormalities. Degenerative disc disease at L1-L2. IMPRESSION: 1. Obstructing 3 mm stone at the right ureterovesicular junction with moderate proximal hydroureteronephrosis. 2. Additional punctate nonobstructing stones in both kidneys. 3. Hepatic steatosis. Electronically Signed   By: Rubye Oaks M.D.   On: 04/24/2015 22:54   I have personally reviewed and evaluated these images and lab results as part of my medical decision-making.   EKG Interpretation None      MDM   Final diagnoses:   Right flank pain  Renal colic on right side  Calculus of gallbladder and bile duct without cholecystitis or obstruction    Hailey Hopkins is a 40 y.o. female here with RUQ pain. Consider acute chole vs biliary colic. Will get labs, RUQ [place], UA. Will give pain meds and reassess.   11:22 PM US showed possible sludge. UA + blood but she had hysterectomy so will get CT renal stone. CT showed 3 mm stone with mod R hydro. Pain controlled. HR and BP improved. Likely biliary vs renal colic. Will dc home with vicodin, motrin, flomax, urology and surgery f/u.     Richardean Canal, MD 04/24/15 [phone]

## 2015-04-24 NOTE — ED Notes (Addendum)
Patient states approximately 45 minutes PTA she began having RUQ pain acutely.  Patient states vomiting started with onset of pain.  Patient states prior to onset of pain/vomiting, she had a meal consisting of black eye peas and fried chicken.  Patient denies fever and diarrhea.  Patient states prior to acute onset of pain, she felt well today.  Patient denies urinary frequency, urgency, hematuria and dysuria.  Patient states she has her appendix and gall bladder still.

## 2015-04-24 NOTE — ED Notes (Signed)
Bed: WA10 Expected date:  Expected time:  Means of arrival:  Comments: 

## 2015-05-31 ENCOUNTER — Ambulatory Visit: Payer: PRIVATE HEALTH INSURANCE | Attending: Surgical

## 2015-05-31 DIAGNOSIS — M25512 Pain in left shoulder: Secondary | ICD-10-CM | POA: Diagnosis present

## 2015-05-31 DIAGNOSIS — R293 Abnormal posture: Secondary | ICD-10-CM | POA: Insufficient documentation

## 2015-05-31 DIAGNOSIS — M25612 Stiffness of left shoulder, not elsewhere classified: Secondary | ICD-10-CM | POA: Diagnosis not present

## 2015-05-31 DIAGNOSIS — M6281 Muscle weakness (generalized): Secondary | ICD-10-CM | POA: Insufficient documentation

## 2015-05-31 DIAGNOSIS — Z9889 Other specified postprocedural states: Secondary | ICD-10-CM | POA: Diagnosis present

## 2015-05-31 DIAGNOSIS — G8929 Other chronic pain: Secondary | ICD-10-CM | POA: Diagnosis present

## 2015-05-31 NOTE — Therapy (Signed)
[facility] [address] [place], [place], [address] Phone: [phone]   Fax:  [phone]  Physical Therapy Evaluation  Patient Details  Name: Hailey Hopkins MRN: [id number] Date of Birth: [birth year] Referring Provider: Jones Broomhandler, Justin, MD  Encounter Date: 05/31/2015      PT End of Session - 05/31/15 1607    Visit Number 1   Date for PT Re-Evaluation 07/26/15   Authorization Type Workers Comp   Authorization - Visit Number 1   Authorization - Number of Visits 10   PT Start Time 1530   PT Stop Time 1605   PT Time Calculation (min) 35 min   Activity Tolerance Patient tolerated treatment well   Behavior During Therapy [facility] for tasks assessed/performed      Past Medical History  Diagnosis Date  . Diverticular disease   . Depression     Past Surgical History  Procedure Laterality Date  . Abdominal hysterectomy    . Rotator cuff repair      There were no vitals filed for this visit.  Visit Diagnosis:  Shoulder stiffness, left - Plan: PT plan of care cert/re-cert  Muscle weakness of left arm - Plan: PT plan of care cert/re-cert  Chronic pain in left shoulder - Plan: PT plan of care cert/re-cert      Subjective Assessment - 05/31/15 1535    Subjective Pt presents to PT after Lt shoulder partial rotator cuff tear repair and distal clavicle excision.  Pt sustained a work injury 04/2014 and had surgery 11/2015.  [facility] until 02/2015 when she stopped going.  She has been doing her exercises at home and MD would like her to return to PT to address pain.     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  Lt shoulder surgery 12/14/2014 for partial rotator cuff repair and distal clavicle excision   Diagnostic tests injection: Cortizone 04/2015   Patient Stated Goals reuce pain, return to normal use of Lt UE   Currently in Pain? Yes   Pain Score 5    Pain  Location Shoulder   Pain Orientation Left   Pain Descriptors / Indicators Aching;Sharp   Pain Type Chronic pain   Pain Onset More than a month ago   Pain Frequency Constant   Aggravating Factors  reaching to close car door, arm hanging at side, use of Lt UE with home and work tasks   Pain Relieving Factors ibuprofen, ice, heat, rest            [facility] PT Assessment - 05/31/15 0001    Assessment   Medical Diagnosis post-op stiffness of Lt shoulder, Lt pectoralis strain, distal clavicle excision, repair of partial tear of rotator cuff and bone spur   Referring Provider Jones Broomhandler, Justin, MD   Onset Date/Surgical Date 12/14/14   Hand Dominance Right   Next MD Visit 06/22/15 Dr [address] Filterhandler  [facility]   Precautions   Precautions None   Precaution Comments limited to lifting 5# at work   Restrictions   Weight Bearing Restrictions No   Balance Screen   Has the patient fallen in the past 6 months No   Has the patient had a decrease in activity level because of a fear of falling?  No   Is the patient reluctant to leave their home because of a fear of falling?  No   Home [employer] managernvironment   Living Environment Private residence  Prior Function   Level of Independence Independent   Vocation Full time employment   Health and safety inspector at [employer]   Overall Cognitive Status Within Functional Limits for tasks assessed   Observation/Other Assessments   Focus on Therapeutic Outcomes (FOTO)  46% limitation   Posture/Postural Control   Posture/Postural Control Postural limitations   Postural Limitations Rounded Shoulders;Forward head   ROM / Strength   AROM / PROM / Strength AROM;PROM;Strength   AROM   Overall AROM  Deficits   Overall AROM Comments Rt shoulder full AROM without pain   AROM Assessment Site Shoulder   Right/Left Shoulder Left   Left Shoulder Flexion 110 Degrees   Left Shoulder ABduction 94 Degrees   Left  Shoulder Internal Rotation --  lacking 6 inches vs Rt (lateral lumbar area, not midline)   Left Shoulder External Rotation --  T1 with sharp pain reported   PROM   Overall PROM  Deficits   PROM Assessment Site Shoulder   Right/Left Shoulder Left   Left Shoulder Flexion 120 Degrees   Left Shoulder ABduction 80 Degrees   Left Shoulder Internal Rotation 61 Degrees   Left Shoulder External Rotation 27 Degrees   Strength   Overall Strength Deficits   Overall Strength Comments Rt shoulder strength 5/5   Strength Assessment Site Shoulder   Right/Left Shoulder Left   Left Shoulder Flexion 4/5   Left Shoulder ABduction 4-/5   Left Shoulder Internal Rotation 4+/5   Left Shoulder External Rotation 4/5   Palpation   Palpation comment Pt with diffuse palpable tenderness and active trigger points over Lt pectoralis, upper  trap, axilla and distal clavicle                           PT Education - 05/31/15 1606    Education provided Yes   Education Details HEP: corner stretch and supine on foam roll   Person(s) Educated Patient   Methods Explanation;Demonstration;Handout   Comprehension Verbalized understanding;Returned demonstration          PT Short Term Goals - 05/31/15 1614    PT SHORT TERM GOAL #1   Title She will be independent with inital HEP    Time 4   Period Weeks   Status New   PT SHORT TERM GOAL #2   Title report a 25% reduction in Lt shoulder pain with use at home and work   Time 4   Period Weeks   Status New   PT SHORT TERM GOAL #3   Title demonstrate Lt shoulder AROM IR to L2 at midline to improve use with self-care   Time 4   Period Weeks   Status New           PT Long Term Goals - 05/31/15 1530    PT LONG TERM GOAL #1   Title be independent in advanced HEP   Time 8   Period Weeks   Status New   PT LONG TERM GOAL #2   Title improve FOTO to < or = to 46% limitation   Time 8   Period Weeks   Status New   PT LONG TERM GOAL #3    Title report a 50% reduction in Lt shoulder pain with work and home tasks   Time 8   Period Weeks   Status New   PT LONG TERM GOAL #4   Title demonstrate 4+/5 Lt shoulder strength throughout to  improve endurance for Lt UE use   PT LONG TERM GOAL #5   Title able to lift to waist and carry 10 pounds or more to increase home and work participation as MD allows   Time 8   Period Weeks   Status New               Plan - 05/31/15 1608    Clinical Impression Statement Pt reports to PT 6 months s/p Lt shoulder surgery for distal clavicle excision and repair of partial tear of rotator cuff 11/2014.  Pt sustained injury at work 11/2014.  Pt had PT until 02/2015 and has been exercising at home since this time.  Pt reports consistent Lt shoulder pain with use, limited Lt shoulder AROM and strength.  FOTO score is 46% limitation.  Pt with diffuse palpable tenderness over Lt  pectoralis, UT, axilla and anterior glenohumeral joint.  Pt will benefit from skilled PT for Lt shoulder AROM progression and pain management to allow for strength and use progression.     Pt will benefit from skilled therapeutic intervention in order to improve on the following deficits Postural dysfunction;Decreased strength;Impaired flexibility;Pain;Impaired UE functional use;Decreased activity tolerance;Decreased range of motion   Rehab Potential Good   PT Frequency 2x / week   PT Duration 8 weeks   PT Treatment/Interventions ADLs/Self Care Home Management;Cryotherapy;Electrical Stimulation;Iontophoresis /ml Dexamethasone;Moist Heat;Therapeutic exercise;Therapeutic activities;Functional mobility training;Ultrasound;Neuromuscular re-education;Patient/family education;Manual techniques;Taping;Dry needling;Passive range of motion   PT Next Visit Plan Lt shoulder AROM, strength, modalities and manual.  Try foam roll for pec stretch   Consulted and Agree with Plan of Care Patient         Problem List Patient Active Problem  List   Diagnosis Date Noted  . Insomnia 10/29/2014  . Plantar fasciitis 12/02/2013  . Benign essential HTN 05/28/2013  . Hepatic steatosis 04/12/2011  . Back pain 06/16/2010  . HEMORRHOIDS, EXTERNAL 12/21/2009  . DEPRESSION, MAJOR, MODERATE 04/04/2007  . HAIR LOSS 08/23/2006  . OBESITY NOS 05/28/2006  . FATIGUE 05/28/2006  . GLUCOSE INTOLERANCE 05/23/2006  . Migraine 05/23/2006    Hailey Hopkins, PT 05/31/2015, 4:18 PM  [facility] [address], [address] [place], [place], [address] Phone: [phone]   Fax:  [phone]  Name: Hailey Hopkins MRN: [id number] Date of Birth: 1975-04-30

## 2015-05-31 NOTE — Patient Instructions (Signed)
Flexibility: Corner Stretch    Standing in corner with hands just above shoulder level and feet ____ inches from corner, lean forward until a comfortable stretch is felt across chest. Hold _10___ seconds. Repeat __3-5__ times per set. Do ____ sets per session. Do _3___ sessions per day.  http://orth.exer.us/343   Copyright  VHI. All rights reserved.  Pectoralis Stretch: Supine (Towel Roll)    Lie with rolled towel under spine, letting shoulders relax toward floor. Lie _1-5__ minutes. Do _2__ times per day.  http://ss.exer.us/355   Copyright  VHI. All rights reserved.  [facility] [address], [address] [place], [place] [address] Phone # [phone] Fax [phone]

## 2015-06-06 ENCOUNTER — Encounter: Payer: Self-pay | Admitting: Physical Therapy

## 2015-06-06 ENCOUNTER — Ambulatory Visit: Payer: PRIVATE HEALTH INSURANCE | Admitting: Physical Therapy

## 2015-06-06 DIAGNOSIS — G8929 Other chronic pain: Secondary | ICD-10-CM

## 2015-06-06 DIAGNOSIS — M25612 Stiffness of left shoulder, not elsewhere classified: Secondary | ICD-10-CM

## 2015-06-06 DIAGNOSIS — M25512 Pain in left shoulder: Secondary | ICD-10-CM

## 2015-06-06 DIAGNOSIS — M6281 Muscle weakness (generalized): Secondary | ICD-10-CM

## 2015-06-06 NOTE — Patient Instructions (Signed)

## 2015-06-06 NOTE — Therapy (Signed)
[facility] [address], [address] [place], [place], [address] Phone: [phone]   Fax:  [phone]  Physical Therapy Treatment  Patient Details  Name: Hailey ButteryFelicia G Defilippo MRN: [id number] Date of Birth: 1976-01-30 Referring Provider: Jones Broomhandler, Justin, MD  Encounter Date: 06/06/2015      PT End of Session - 06/06/15 1511    Visit Number 2   Date for PT Re-Evaluation 07/26/15   Authorization Type Workers Comp   Authorization - Visit Number 2   Authorization - Number of Visits 10   PT Start Time 1445   PT Stop Time 1530   PT Time Calculation (min) 45 min   Activity Tolerance Patient tolerated treatment well   Behavior During Therapy [facility] for tasks assessed/performed      Past Medical History  Diagnosis Date  . Diverticular disease   . Depression     Past Surgical History  Procedure Laterality Date  . Abdominal hysterectomy    . Rotator cuff repair      There were no vitals filed for this visit.  Visit Diagnosis:  Shoulder stiffness, left  Muscle weakness of left arm  Chronic pain in left shoulder  Stiffness of left shoulder joint      Subjective Assessment - 06/06/15 1507    Subjective Pain is constant in left shoulder rated as 3/10, pt takes daily painmeds.   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  Lt shoulder surgery 12/14/2014 for partial rotator cuff repair and distal clavicle excision   Diagnostic tests injection: Cortizone 04/2015   Patient Stated Goals reduce pain, return to normal use of Lt UE   Currently in Pain? Yes   Pain Score 3    Pain Location Shoulder   Pain Orientation Left   Pain Descriptors / Indicators Aching;Sharp   Pain Type Chronic pain   Pain Onset More than a month ago   Pain Frequency Constant   Aggravating Factors  reaching to close car door, arm hanging at side, use of Lt UE with home and work task   Pain Relieving Factors ibuprofen, ice, heat, rest    Multiple Pain Sites No                         [facility] Adult PT Treatment/Exercise - 06/06/15 0001    Exercises   Exercises Shoulder   Shoulder Exercises: Supine   Other Supine Exercises Foam Roll x 3 min, snowangel in avail. range with Lt 2x10, bil flexion 90degrees 2 x10   Other Supine Exercises Overhead doorstretch standing on 6" step with gentle traction x 10   Shoulder Exercises: Pulleys   Flexion 3 minutes   ABduction 3 minutes   Shoulder Exercises: Stretch   Wall Stretch - ABduction Other (comment)  10 in available range   Modalities   Modalities Iontophoresis   Iontophoresis   Type of Iontophoresis Dexamethasone   Location left ant shoulder   Dose 1ml   Time 6hour patch   Manual Therapy   Manual Therapy Soft tissue mobilization   Manual therapy comments gentle softtissue work to left shoulder with PROM   Soft tissue mobilization pectoalis and deltoid and ant capsule                PT Education - 06/06/15 1523    Education provided Yes   Education Details Iontophoresis Precautions & contraindications   Person(s) Educated Patient   Methods Explanation;Handout   Comprehension Verbalized understanding  PT Short Term Goals - 06/06/15 1514    PT SHORT TERM GOAL #1   Title She will be independent with inital HEP    Baseline can reproduce correctly without cues.   Time 4   Period Weeks   Status On-going   PT SHORT TERM GOAL #2   Title report a 25% reduction in Lt shoulder pain with use at home and work   Baseline AA arm to fix hair   Time 4   Period Weeks   Status On-going   PT SHORT TERM GOAL #3   Title demonstrate Lt shoulder AROM IR to L2 at midline to improve use with self-care   Baseline understands and uses RICE at home   Time 4   Period Weeks   Status On-going           PT Long Term Goals - 05/31/15 1530    PT LONG TERM GOAL #1   Title be independent in advanced HEP   Time 8   Period Weeks   Status New   PT  LONG TERM GOAL #2   Title improve FOTO to < or = to 46% limitation   Time 8   Period Weeks   Status New   PT LONG TERM GOAL #3   Title report a 50% reduction in Lt shoulder pain with work and home tasks   Time 8   Period Weeks   Status New   PT LONG TERM GOAL #4   Title demonstrate 4+/5 Lt shoulder strength throughout to improve endurance for Lt UE use   PT LONG TERM GOAL #5   Title able to lift to waist and carry 10 pounds or more to increase home and work participation as MD allows   Time 8   Period Weeks   Status New               Plan - 06/06/15 1512    Clinical Impression Statement Pt with limited ROM in left shoulder and pain at available end range. Pt very sensitive to touch in left shoulder area.   Pt will benefit from skilled therapeutic intervention in order to improve on the following deficits Postural dysfunction;Decreased strength;Impaired flexibility;Pain;Impaired UE functional use;Decreased activity tolerance;Decreased range of motion   Rehab Potential Good   Clinical Impairments Affecting Rehab Potential 11/2014 Lt shoulder surgery of partail tear and distal clavicle excision   PT Frequency 2x / week   PT Duration 8 weeks   PT Treatment/Interventions ADLs/Self Care Home Management;Cryotherapy;Electrical Stimulation;Iontophoresis /ml Dexamethasone;Moist Heat;Therapeutic exercise;Therapeutic activities;Functional mobility training;Ultrasound;Neuromuscular re-education;Patient/family education;Manual techniques;Taping;Dry needling;Passive range of motion   PT Next Visit Plan Lt shoulder AROM, strength, modalities and manual.  Try foam roll for pec stretch   Consulted and Agree with Plan of Care Patient        Problem List Patient Active Problem List   Diagnosis Date Noted  . Insomnia 10/29/2014  . Plantar fasciitis 12/02/2013  . Benign essential HTN 05/28/2013  . Hepatic steatosis 04/12/2011  . Back pain 06/16/2010  . HEMORRHOIDS, EXTERNAL 12/21/2009   . DEPRESSION, MAJOR, MODERATE 04/04/2007  . HAIR LOSS 08/23/2006  . OBESITY NOS 05/28/2006  . FATIGUE 05/28/2006  . GLUCOSE INTOLERANCE 05/23/2006  . Migraine 05/23/2006    NAUMANN-HOUEGNIFIO,Jaylynn Siefert PTA 06/06/2015, 3:37 PM  [facility] [address], [address] [place], [place], [address] Phone: [phone]   Fax:  [phone]  Name: HUMNA MOOREHOUSE MRN: [id number] Date of Birth: 1975-12-28

## 2015-06-07 MED FILL — MELOXICAM 15 MG TABLET: 15 | 30 days supply | Qty: 30 | Fill #0

## 2015-06-09 ENCOUNTER — Encounter: Payer: Self-pay | Admitting: Physical Therapy

## 2015-06-09 ENCOUNTER — Ambulatory Visit: Payer: PRIVATE HEALTH INSURANCE | Admitting: Physical Therapy

## 2015-06-09 DIAGNOSIS — M6281 Muscle weakness (generalized): Secondary | ICD-10-CM

## 2015-06-09 DIAGNOSIS — M25612 Stiffness of left shoulder, not elsewhere classified: Secondary | ICD-10-CM

## 2015-06-09 DIAGNOSIS — G8929 Other chronic pain: Secondary | ICD-10-CM

## 2015-06-09 DIAGNOSIS — Z9889 Other specified postprocedural states: Secondary | ICD-10-CM

## 2015-06-09 DIAGNOSIS — R293 Abnormal posture: Secondary | ICD-10-CM

## 2015-06-09 DIAGNOSIS — M25512 Pain in left shoulder: Secondary | ICD-10-CM

## 2015-06-09 NOTE — Therapy (Signed)
[facility] [address] [place], [place], [address] Phone: [phone]   Fax:  [phone]  Physical Therapy Treatment  Patient Details  Name: Hailey Hopkins MRN: [id number] Date of Birth: 1975-09-13 Referring Provider: Jones Broom, MD  Encounter Date: 06/09/2015      PT End of Session - 06/09/15 0833    Visit Number 3   Date for PT Re-Evaluation 07/26/15   Authorization Type Workers Comp   Authorization - Visit Number 3   PT Start Time 0804   PT Stop Time 0858   PT Time Calculation (min) 54 min   Activity Tolerance Patient tolerated treatment well   Behavior During Therapy [facility] for tasks assessed/performed      Past Medical History  Diagnosis Date  . Diverticular disease   . Depression     Past Surgical History  Procedure Laterality Date  . Abdominal hysterectomy    . Rotator cuff repair      There were no vitals filed for this visit.  Visit Diagnosis:  Shoulder stiffness, left  Muscle weakness of left arm  Chronic pain in left shoulder  Stiffness of left shoulder joint  Status post arthroscopy of shoulder  Status post subacromial decompression  Abnormal posture      Subjective Assessment - 06/09/15 0812    Subjective Pt had injection into left shoulder joint under Ultrasound to break down scartissue.  MD wishes her to have PT today. 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  Lt shoulder surgery 12/14/2014 for partial rotator cuff repair and distal clavicle excision   Diagnostic tests injection: Cortizone 04/2015   Patient Stated Goals reduce pain, return to normal use of Lt UE   Currently in Pain? Yes   Pain Score 6    Pain Location Shoulder   Pain Orientation Left   Pain Descriptors / Indicators Aching   Pain Type Chronic pain   Pain Onset More than a month ago   Pain Frequency Constant    Aggravating Factors  reaching to close car door, arm hanging at side, use of Lt UE with home and work task   Pain Relieving Factors ibuprofen, ice, heat, rest   Multiple Pain Sites No                         [facility] Adult PT Treatment/Exercise - 06/09/15 0001    Posture/Postural Control   Posture/Postural Control Postural limitations   Postural Limitations Rounded Shoulders;Forward head   Exercises   Exercises Shoulder   Shoulder Exercises: Supine   Other Supine Exercises Foam Roll x 3 min, left UE supported on pillow   Shoulder Exercises: Seated   Other Seated Exercises tableslides x 10 with 3 sec hold   Shoulder Exercises: Standing   Other Standing Exercises AAROM into flexion at wall advised to practice at work and at home   Shoulder Exercises: Pulleys   Flexion 3 minutes   Modalities   Modalities Iontophoresis;Moist Heat;Cryotherapy   Moist Heat Therapy   Number Minutes Moist Heat 15 Minutes   Moist Heat Location Other (comment)  Chest   Cryotherapy   Number Minutes Cryotherapy 15 Minutes   Cryotherapy Location Shoulder  Lt   Type of Cryotherapy Ice pack   Iontophoresis   Type of Iontophoresis Dexamethasone   Location left ant shoulder   Dose 1ml   Time 6hour patch  Manual Therapy   Manual Therapy Soft tissue mobilization   Manual therapy comments gentle softtissue work to left shoulder with PROM   Soft tissue mobilization pectoralis and deltoid and ant capsule, with PROM and ossilitation                  PT Short Term Goals - 06/06/15 1514    PT SHORT TERM GOAL #1   Title She will be independent with inital HEP    Baseline can reproduce correctly without cues.   Time 4   Period Weeks   Status On-going   PT SHORT TERM GOAL #2   Title report a 25% reduction in Lt shoulder pain with use at home and work   Baseline AA arm to fix hair   Time 4   Period Weeks   Status On-going   PT SHORT TERM GOAL #3   Title demonstrate Lt shoulder AROM  IR to L2 at midline to improve use with self-care   Baseline understands and uses RICE at home   Time 4   Period Weeks   Status On-going           PT Long Term Goals - 05/31/15 1530    PT LONG TERM GOAL #1   Title be independent in advanced HEP   Time 8   Period Weeks   Status New   PT LONG TERM GOAL #2   Title improve FOTO to < or = to 46% limitation   Time 8   Period Weeks   Status New   PT LONG TERM GOAL #3   Title report a 50% reduction in Lt shoulder pain with work and home tasks   Time 8   Period Weeks   Status New   PT LONG TERM GOAL #4   Title demonstrate 4+/5 Lt shoulder strength throughout to improve endurance for Lt UE use   PT LONG TERM GOAL #5   Title able to lift to waist and carry 10 pounds or more to increase home and work participation as MD allows   Time 8   Period Weeks   Status New               Plan - 06/09/15 [id number]    Clinical Impression Statement Pt with limited ROM Lt shoulder and pain due to procedure yesterday. Pt able to tolerate gentle activities and manual therapy. Pt will continue to benefit from PT for pain control, increase ROM and strength   Pt will benefit from skilled therapeutic intervention in order to improve on the following deficits Postural dysfunction;Decreased strength;Impaired flexibility;Pain;Impaired UE functional use;Decreased activity tolerance;Decreased range of motion   Rehab Potential Good   Clinical Impairments Affecting Rehab Potential 11/2014 Lt shoulder surgery of partail tear and distal clavicle excision   PT Frequency 2x / week   PT Duration 8 weeks   PT Next Visit Plan PROM Lt shoulder, gentle strengthening  modalities and manual.  Try foam roll for pec stretch   Consulted and Agree with Plan of Care Patient        Problem List Patient Active Problem List   Diagnosis Date Noted  . Insomnia 10/29/2014  . Plantar fasciitis 12/02/2013  . Benign essential HTN 05/28/2013  . Hepatic steatosis 04/12/2011   . Back pain 06/16/2010  . HEMORRHOIDS, EXTERNAL 12/21/2009  . DEPRESSION, MAJOR, MODERATE 04/04/2007  . HAIR LOSS 08/23/2006  . OBESITY NOS 05/28/2006  . FATIGUE 05/28/2006  . GLUCOSE INTOLERANCE 05/23/2006  . Migraine 05/23/2006  NAUMANN-HOUEGNIFIO,Zohar Maroney PTA 06/09/2015, 9:58 AM  [facility] [address], [address] [place], [place], [address] Phone: [phone]   Fax:  [phone]  Name: Hailey Hopkins MRN: [id number] Date of Birth: 1975-07-24

## 2015-06-13 ENCOUNTER — Ambulatory Visit: Payer: PRIVATE HEALTH INSURANCE | Admitting: Physical Therapy

## 2015-06-13 ENCOUNTER — Encounter: Payer: Self-pay | Admitting: Physical Therapy

## 2015-06-13 DIAGNOSIS — M25612 Stiffness of left shoulder, not elsewhere classified: Secondary | ICD-10-CM

## 2015-06-13 DIAGNOSIS — M6281 Muscle weakness (generalized): Secondary | ICD-10-CM

## 2015-06-13 DIAGNOSIS — M25512 Pain in left shoulder: Secondary | ICD-10-CM

## 2015-06-13 DIAGNOSIS — R293 Abnormal posture: Secondary | ICD-10-CM

## 2015-06-13 DIAGNOSIS — Z9889 Other specified postprocedural states: Secondary | ICD-10-CM

## 2015-06-13 DIAGNOSIS — G8929 Other chronic pain: Secondary | ICD-10-CM

## 2015-06-13 NOTE — Therapy (Signed)
[facility] [address], [address] [place], [place], [address] Phone: [phone]   Fax:  [phone]  Physical Therapy Treatment  Patient Details  Name: Hailey Hopkins MRN: [id number] Date of Birth: 01-Aug-1975 Referring Provider: Jones Broomhandler, Justin, MD  Encounter Date: 06/13/2015      PT End of Session - 06/13/15 1548    Visit Number 4   Date for PT Re-Evaluation 07/26/15   Authorization Type Workers Comp   Authorization - Visit Number 4   Authorization - Number of Visits 10   PT Start Time 1527   PT Stop Time 1630   PT Time Calculation (min) 63 min   Activity Tolerance Patient tolerated treatment well   Behavior During Therapy [facility] for tasks assessed/performed      Past Medical History  Diagnosis Date  . Diverticular disease   . Depression     Past Surgical History  Procedure Laterality Date  . Abdominal hysterectomy    . Rotator cuff repair      There were no vitals filed for this visit.  Visit Diagnosis:  Shoulder stiffness, left  Muscle weakness of left arm  Chronic pain in left shoulder  Stiffness of left shoulder joint  Status post arthroscopy of shoulder  Abnormal posture  Status post subacromial decompression      Subjective Assessment - 06/13/15 1543    Subjective Pt reports her pain as 4/10 today after working.   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  Lt shoulder surgery 12/14/2014 for partial rotator cuff repair and distal clavicle excision. March 15th injection into left shoulder under Ultrasound to break up scar tissue   Diagnostic tests injection: Cortizone 04/2015   Patient Stated Goals reduce pain, return to normal use of Lt UE   Currently in Pain? Yes   Pain Score 4    Pain Location Shoulder   Pain Orientation Left   Pain Descriptors / Indicators Aching   Pain Type Chronic pain   Pain Onset More than a month ago   Pain Frequency Constant   Aggravating  Factors  reaching to close car door, arm hanging at side, use of Lt UE with hjome and work task     Pain Relieving Factors ibuprofen, ice, heat, rest   Multiple Pain Sites No                         [facility] Adult PT Treatment/Exercise - 06/13/15 0001    Posture/Postural Control   Posture/Postural Control Postural limitations   Postural Limitations Rounded Shoulders;Forward head   Exercises   Exercises Shoulder   Shoulder Exercises: Supine   Other Supine Exercises Foam Roll  attemted, but pt's with pain in clavicle area today, discontinued   Shoulder Exercises: Seated   Other Seated Exercises tableslides x 10 with 3 sec hold   Shoulder Exercises: Standing   Other Standing Exercises AAROM into flexion and abduction at wall 1 x 10   Shoulder Exercises: Pulleys   Flexion 3 minutes   ABduction 3 minutes   Modalities   Modalities Iontophoresis;Moist Heat;Cryotherapy;Ultrasound   Ultrasound   Ultrasound Location Lt pectoralis   Ultrasound Parameters 50%, 1Mhz, 0.8Wcm   Ultrasound Goals Pain   Iontophoresis   Type of Iontophoresis Dexamethasone   Location left ant shoulder   Dose 1ml   Time 6hour patch   Manual Therapy   Manual Therapy Soft tissue mobilization   Manual therapy comments gentle softtissue work to left  shoulder with PROM   Soft tissue mobilization pectoralis and deltoid and ant capsule, with PROM and ossilitation                  PT Short Term Goals - 06/13/15 1559    PT SHORT TERM GOAL #1   Title She will be independent with inital HEP    Baseline can reproduce correctly without cues.   Time 4   Period Weeks   Status On-going   PT SHORT TERM GOAL #2   Title report a 25% reduction in Lt shoulder pain with use at home and work   Baseline AA arm to fix hair   Time 4   Period Weeks   Status On-going   PT SHORT TERM GOAL #3   Title demonstrate Lt shoulder AROM IR to L2 at midline to improve use with self-care   Baseline understands and  uses RICE at home   Time 4   Period Weeks   Status On-going           PT Long Term Goals - 05/31/15 1530    PT LONG TERM GOAL #1   Title be independent in advanced HEP   Time 8   Period Weeks   Status New   PT LONG TERM GOAL #2   Title improve FOTO to < or = to 46% limitation   Time 8   Period Weeks   Status New   PT LONG TERM GOAL #3   Title report a 50% reduction in Lt shoulder pain with work and home tasks   Time 8   Period Weeks   Status New   PT LONG TERM GOAL #4   Title demonstrate 4+/5 Lt shoulder strength throughout to improve endurance for Lt UE use   PT LONG TERM GOAL #5   Title able to lift to waist and carry 10 pounds or more to increase home and work participation as MD allows   Time 8   Period Weeks   Status New               Plan - 06/13/15 1555    Clinical Impression Statement Pt with sliglhy swelling and palpaple tenderness over left pectoralis area. Used Ultrasound on pectoralis area to address pain and edema. Pt will continue to benefit from skilled PT for pain control, increase ROM and strenght.     Pt will benefit from skilled therapeutic intervention in order to improve on the following deficits Postural dysfunction;Decreased strength;Impaired flexibility;Pain;Impaired UE functional use;Decreased activity tolerance;Decreased range of motion   Rehab Potential Good   Clinical Impairments Affecting Rehab Potential 11/2014 Lt shoulder surgery of partail tear and distal clavicle excision   PT Frequency 2x / week   PT Duration 8 weeks   PT Treatment/Interventions ADLs/Self Care Home Management;Cryotherapy;Electrical Stimulation;Iontophoresis /ml Dexamethasone;Moist Heat;Therapeutic exercise;Therapeutic activities;Functional mobility training;Ultrasound;Neuromuscular re-education;Patient/family education;Manual techniques;Taping;Dry needling;Passive range of motion   PT Next Visit Plan Continue with [place] if beneficial, [place], strengthening and  flexibility to pt's tolerance.    Consulted and Agree with Plan of Care Patient        Problem List Patient Active Problem List   Diagnosis Date Noted  . Insomnia 10/29/2014  . Plantar fasciitis 12/02/2013  . Benign essential HTN 05/28/2013  . Hepatic steatosis 04/12/2011  . Back pain 06/16/2010  . HEMORRHOIDS, EXTERNAL 12/21/2009  . DEPRESSION, MAJOR, MODERATE 04/04/2007  . HAIR LOSS 08/23/2006  . OBESITY NOS 05/28/2006  . FATIGUE 05/28/2006  . GLUCOSE INTOLERANCE 05/23/2006  .  Migraine 05/23/2006    NAUMANN-HOUEGNIFIO,Hailey Hopkins PTA 06/13/2015, 4:22 PM  [facility] [address], [address] [place], [place], [address] Phone: [phone]   Fax:  [phone]  Name: Hailey Hopkins MRN: [id number] Date of Birth: Apr 17, 1975

## 2015-06-15 ENCOUNTER — Encounter: Payer: Self-pay | Admitting: Physical Therapy

## 2015-06-15 ENCOUNTER — Ambulatory Visit: Payer: PRIVATE HEALTH INSURANCE | Admitting: Physical Therapy

## 2015-06-15 DIAGNOSIS — M6281 Muscle weakness (generalized): Secondary | ICD-10-CM

## 2015-06-15 DIAGNOSIS — G8929 Other chronic pain: Secondary | ICD-10-CM

## 2015-06-15 DIAGNOSIS — M25612 Stiffness of left shoulder, not elsewhere classified: Secondary | ICD-10-CM | POA: Diagnosis not present

## 2015-06-15 DIAGNOSIS — M25512 Pain in left shoulder: Secondary | ICD-10-CM

## 2015-06-15 DIAGNOSIS — R293 Abnormal posture: Secondary | ICD-10-CM

## 2015-06-15 NOTE — Therapy (Signed)
[facility] [address] [place], [place], [address] Phone: [phone]   Fax:  [phone]  Physical Therapy Treatment  Patient Details  Name: Hailey Hopkins MRN: [id number] Date of Birth: 1975-11-02 Referring Provider: Jones Broom, MD  Encounter Date: 06/15/2015      PT End of Session - 06/15/15 1530    Visit Number 5   Date for PT Re-Evaluation 07/26/15   Authorization Type Workers Comp   Authorization - Visit Number 5   Authorization - Number of Visits 10   PT Start Time 1530   PT Stop Time 1608   PT Time Calculation (min) 38 min   Activity Tolerance Patient limited by pain   Behavior During Therapy [facility] for tasks assessed/performed      Past Medical History  Diagnosis Date  . Diverticular disease   . Depression     Past Surgical History  Procedure Laterality Date  . Abdominal hysterectomy    . Rotator cuff repair      There were no vitals filed for this visit.  Visit Diagnosis:  Shoulder stiffness, left  Muscle weakness of left arm  Chronic pain in left shoulder  Abnormal posture      Subjective Assessment - 06/15/15 1527    Subjective Collarbone area/pectorals still sore today. Tried some stretching at work today.    Currently in Pain? Yes   Pain Score 4    Pain Location --  clavicle   Pain Orientation Left   Pain Descriptors / Indicators Aching   Aggravating Factors  Reaching   Pain Relieving Factors Ice, meds, heat   Multiple Pain Sites No                         [facility] Adult PT Treatment/Exercise - 06/15/15 0001    Shoulder Exercises: Standing   Other Standing Exercises Pec self release with small ball   Shoulder Exercises: Pulleys   Flexion 3 minutes   ABduction 3 minutes   Ultrasound   Ultrasound Location Lt pectoral   Ultrasound Parameters 50%, 3 MZ 1.2wtcm2   Ultrasound Goals Pain   Iontophoresis   Type of Iontophoresis Dexamethasone   Location left ant shoulder   Dose 1 ml   Time 6hour patch  Skin was intact but pt reportsa red circle last time   Manual Therapy   Manual Therapy Soft tissue mobilization   Manual therapy comments gentle softtissue work to left shoulder with PROM   Soft tissue mobilization pectoralis and deltoid and ant capsule, with PROM and ossilitation                  PT Short Term Goals - 06/13/15 1559    PT SHORT TERM GOAL #1   Title She will be independent with inital HEP    Baseline can reproduce correctly without cues.   Time 4   Period Weeks   Status On-going   PT SHORT TERM GOAL #2   Title report a 25% reduction in Lt shoulder pain with use at home and work   Baseline AA arm to fix hair   Time 4   Period Weeks   Status On-going   PT SHORT TERM GOAL #3   Title demonstrate Lt shoulder AROM IR to L2 at midline to improve use with self-care   Baseline understands and uses RICE at home   Time 4   Period Weeks   Status On-going  PT Long Term Goals - 05/31/15 1530    PT LONG TERM GOAL #1   Title be independent in advanced HEP   Time 8   Period Weeks   Status New   PT LONG TERM GOAL #2   Title improve FOTO to < or = to 46% limitation   Time 8   Period Weeks   Status New   PT LONG TERM GOAL #3   Title report a 50% reduction in Lt shoulder pain with work and home tasks   Time 8   Period Weeks   Status New   PT LONG TERM GOAL #4   Title demonstrate 4+/5 Lt shoulder strength throughout to improve endurance for Lt UE use   PT LONG TERM GOAL #5   Title able to lift to waist and carry 10 pounds or more to increase home and work participation as MD allows   Time 8   Period Weeks   Status New               Plan - 06/15/15 1531    Clinical Impression Statement Pt remians with swelling along the left chest wall. Modalities and soft tissue work helping to reduce pain.    Pt will benefit from skilled therapeutic intervention in order to improve on the  following deficits Postural dysfunction;Decreased strength;Impaired flexibility;Pain;Impaired UE functional use;Decreased activity tolerance;Decreased range of motion   Rehab Potential Good   Clinical Impairments Affecting Rehab Potential 11/2014 Lt shoulder surgery of partail tear and distal clavicle excision   PT Frequency 2x / week   PT Duration 8 weeks   PT Treatment/Interventions ADLs/Self Care Home Management;Cryotherapy;Electrical Stimulation;Iontophoresis 4mg /ml Dexamethasone;Moist Heat;Therapeutic exercise;Therapeutic activities;Functional mobility training;Ultrasound;Neuromuscular re-education;Patient/family education;Manual techniques;Taping;Dry needling;Passive range of motion   PT Next Visit Plan US, soft tissue, ionto, A/P/ROM Lt shoulder   Consulted and Agree with Plan of Care --        Problem List Patient Active Problem List   Diagnosis Date Noted  . Insomnia 10/29/2014  . Plantar fasciitis 12/02/2013  . Benign essential HTN 05/28/2013  . Hepatic steatosis 04/12/2011  . Back pain 06/16/2010  . HEMORRHOIDS, EXTERNAL 12/21/2009  . DEPRESSION, MAJOR, MODERATE 04/04/2007  . HAIR LOSS 08/23/2006  . OBESITY NOS 05/28/2006  . FATIGUE 05/28/2006  . GLUCOSE INTOLERANCE 05/23/2006  . Migraine 05/23/2006    Hailey Hopkins, PTA 06/15/2015, 4:08 PM  [facility] [address], [address] [place], [place], [address] Phone: [phone]   Fax:  [phone]  Name: Hailey Hopkins MRN: [id number] Date of Birth: [birth year]

## 2015-06-20 ENCOUNTER — Ambulatory Visit: Payer: PRIVATE HEALTH INSURANCE | Admitting: Physical Therapy

## 2015-06-20 ENCOUNTER — Encounter: Payer: Self-pay | Admitting: Physical Therapy

## 2015-06-20 DIAGNOSIS — G8929 Other chronic pain: Secondary | ICD-10-CM

## 2015-06-20 DIAGNOSIS — M25512 Pain in left shoulder: Secondary | ICD-10-CM

## 2015-06-20 DIAGNOSIS — M25612 Stiffness of left shoulder, not elsewhere classified: Secondary | ICD-10-CM

## 2015-06-20 DIAGNOSIS — M6281 Muscle weakness (generalized): Secondary | ICD-10-CM

## 2015-06-20 DIAGNOSIS — R293 Abnormal posture: Secondary | ICD-10-CM

## 2015-06-20 NOTE — Therapy (Signed)
[facility] [address], [address] [place], [place], [address] Phone: [phone]   Fax:  [phone]  Physical Therapy Treatment  Patient Details  Name: Hailey Hopkins MRN: [id number] Date of Birth: July 14, 1975 Referring Provider: Jones Broom, MD  Encounter Date: 06/20/2015      PT End of Session - 06/20/15 1528    Visit Number 6   Date for PT Re-Evaluation 07/26/15   Authorization Type Workers Comp   Authorization - Visit Number 6   Authorization - Number of Visits 10   PT Start Time 1528   PT Stop Time 1625   PT Time Calculation (min) 57 min   Activity Tolerance Patient tolerated treatment well   Behavior During Therapy [facility] for tasks assessed/performed      Past Medical History  Diagnosis Date  . Diverticular disease   . Depression     Past Surgical History  Procedure Laterality Date  . Abdominal hysterectomy    . Rotator cuff repair      There were no vitals filed for this visit.  Visit Diagnosis:  Shoulder stiffness, left  Muscle weakness of left arm  Chronic pain in left shoulder  Abnormal posture  Stiffness of left shoulder joint      Subjective Assessment - 06/20/15 1529    Subjective Better over all since last visit. Pain about 20% decreased since eval.    Currently in Pain? Yes   Pain Score 2    Pain Location --  collarbone   Pain Orientation Left   Pain Descriptors / Indicators Sore   Aggravating Factors  reaching   Pain Relieving Factors ice, meds, heat   Multiple Pain Sites No                         [facility] Adult PT Treatment/Exercise - 06/20/15 0001    Shoulder Exercises: Standing   External Rotation Strengthening;Left;10 reps;Theraband   Theraband Level (Shoulder External Rotation) Level 2 (Red)   Internal Rotation Strengthening;Left;10 reps;Theraband   Theraband Level (Shoulder Internal Rotation) Level 2 (Red)   Row Strengthening;Left;10 reps;Theraband    Theraband Level (Shoulder Row) Level 2 (Red)   Other Standing Exercises Finger ladder 10x   Shoulder Exercises: Pulleys   Flexion 3 minutes   ABduction 3 minutes   Shoulder Exercises: ROM/Strengthening   Wall Pushups 10 reps   Moist Heat Therapy   Number Minutes Moist Heat 15 Minutes   Moist Heat Location Shoulder   Electrical Stimulation   Electrical Stimulation Location Lt pec/shoulder   Electrical Stimulation Action IFC   Electrical Stimulation Parameters 80-150HZ    Electrical Stimulation Goals Pain   Ultrasound   Ultrasound Location Just medial to Lt shoulder   Ultrasound Parameters 100% 1wtcm2    Ultrasound Goals Pain   Iontophoresis   Type of Iontophoresis --  Declines today secondary to skin very itchy last time.                   PT Short Term Goals - 06/20/15 1602    PT SHORT TERM GOAL #1   Title She will be independent with inital HEP    Time 4   Period Weeks   Status Achieved   PT SHORT TERM GOAL #2   Title report a 25% reduction in Lt shoulder pain with use at home and work   Time 4   Period Weeks   Status On-going  20%   PT SHORT  TERM GOAL #3   Title demonstrate Lt shoulder AROM IR to L2 at midline to improve use with self-care   Time 4   Period Weeks   Status On-going           PT Long Term Goals - 05/31/15 1530    PT LONG TERM GOAL #1   Title be independent in advanced HEP   Time 8   Period Weeks   Status New   PT LONG TERM GOAL #2   Title improve FOTO to < or = to 46% limitation   Time 8   Period Weeks   Status New   PT LONG TERM GOAL #3   Title report a 50% reduction in Lt shoulder pain with work and home tasks   Time 8   Period Weeks   Status New   PT LONG TERM GOAL #4   Title demonstrate 4+/5 Lt shoulder strength throughout to improve endurance for Lt UE use   PT LONG TERM GOAL #5   Title able to lift to waist and carry 10 pounds or more to increase home and work participation as MD allows   Time 8   Period Weeks    Status New               Plan - 06/20/15 1558    Clinical Impression Statement Pain is 20% better since eval per pt report. Pt declined further Ionto treatments due to her skin getting irritated by the adhesive. We replaced this today with Estim & heat. Pt was able to perform scapular stabs & strength without much pain today.     Pt will benefit from skilled therapeutic intervention in order to improve on the following deficits Postural dysfunction;Decreased strength;Impaired flexibility;Pain;Impaired UE functional use;Decreased activity tolerance;Decreased range of motion   Rehab Potential Good   PT Duration 8 weeks   PT Treatment/Interventions ADLs/Self Care Home Management;Cryotherapy;Electrical Stimulation;Iontophoresis 4mg /ml Dexamethasone;Moist Heat;Therapeutic exercise;Therapeutic activities;Functional mobility training;Ultrasound;Neuromuscular re-education;Patient/family education;Manual techniques;Taping;Dry needling;Passive range of motion   PT Next Visit Plan Scapular stabs/strength per tolerance. Modalities for pain.    Consulted and Agree with Plan of Care Patient        Problem List Patient Active Problem List   Diagnosis Date Noted  . Insomnia 10/29/2014  . Plantar fasciitis 12/02/2013  . Benign essential HTN 05/28/2013  . Hepatic steatosis 04/12/2011  . Back pain 06/16/2010  . HEMORRHOIDS, EXTERNAL 12/21/2009  . DEPRESSION, MAJOR, MODERATE 04/04/2007  . HAIR LOSS 08/23/2006  . OBESITY NOS 05/28/2006  . FATIGUE 05/28/2006  . GLUCOSE INTOLERANCE 05/23/2006  . Migraine 05/23/2006    Hailey Hopkins, PTA 06/20/2015, 4:11 PM  [facility] [address], [address] [place], [place], [address] Phone: [phone]   Fax:  [phone]  Name: Hailey Hopkins MRN: [id number] Date of Birth: 14-Apr-1975

## 2015-06-22 ENCOUNTER — Ambulatory Visit: Payer: PRIVATE HEALTH INSURANCE

## 2015-06-22 DIAGNOSIS — M25612 Stiffness of left shoulder, not elsewhere classified: Secondary | ICD-10-CM | POA: Diagnosis not present

## 2015-06-22 DIAGNOSIS — M6281 Muscle weakness (generalized): Secondary | ICD-10-CM

## 2015-06-22 DIAGNOSIS — R293 Abnormal posture: Secondary | ICD-10-CM

## 2015-06-22 DIAGNOSIS — G8929 Other chronic pain: Secondary | ICD-10-CM

## 2015-06-22 DIAGNOSIS — M25512 Pain in left shoulder: Secondary | ICD-10-CM

## 2015-06-22 NOTE — Therapy (Signed)
[facility] [address], [address] [place], [place], [address] Phone: [phone]   Fax:  [phone]  Physical Therapy Treatment  Patient Details  Name: Hailey Hopkins MRN: [id number] Date of Birth: 01/24/1976 Referring Provider: Jones Broomhandler, Justin, MD  Encounter Date: 06/22/2015      Hailey Hopkins End of Session - 06/22/15 1610    Visit Number 7   Date for Hailey Hopkins Re-Evaluation 07/26/15   Authorization Type Workers Comp   Authorization - Visit Number 7   Authorization - Number of Visits 10   Hailey Hopkins Start Time 1527   Hailey Hopkins Stop Time 1625   Hailey Hopkins Time Calculation (min) 58 min   Activity Tolerance Patient tolerated treatment well   Behavior During Therapy [facility] for tasks assessed/performed      Past Medical History  Diagnosis Date  . Diverticular disease   . Depression     Past Surgical History  Procedure Laterality Date  . Abdominal hysterectomy    . Rotator cuff repair      There were no vitals filed for this visit.  Visit Diagnosis:  Shoulder stiffness, left  Muscle weakness of left arm  Chronic pain in left shoulder  Abnormal posture  Stiffness of left shoulder joint      Subjective Assessment - 06/22/15 1527    Subjective I did something to my arm today and it is really hurting.  Reached out to pick something up and when pulled the arm back and it was hurting.     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  Lt shoulder surgery 12/14/2014 for partial rotator cuff repair and distal clavicle excision. March 15th injection into left shoulder under Ultrasound to break up scar tissue   Diagnostic tests injection: Cortizone 04/2015   Patient Stated Goals reduce pain, return to normal use of Lt UE   Currently in Pain? Yes   Pain Score 5    Pain Location Shoulder   Pain Orientation Left   Pain Descriptors / Indicators Sore   Pain Type Chronic pain   Pain Onset More than a month ago   Pain Frequency Constant                          [facility] Adult Hailey Hopkins Treatment/Exercise - 06/22/15 0001    Shoulder Exercises: Supine   Other Supine Exercises supine on foam roll for pec stretch x 3 minutes.  Then horizonal abduction with red band 2x10   Other Supine Exercises snow angels on foam roll, partial range x 10   Shoulder Exercises: Standing   External Rotation Strengthening;Left;10 reps;Theraband   Theraband Level (Shoulder External Rotation) Level 2 (Red)   Internal Rotation Strengthening;Left;10 reps;Theraband   Theraband Level (Shoulder Internal Rotation) Level 2 (Red)   Flexion AAROM;Strengthening;Left;10 reps  using finger ladder   Row Strengthening;Left;10 reps;Theraband   Theraband Level (Shoulder Row) Level 2 (Red)   Shoulder Exercises: ROM/Strengthening   UBE (Upper Arm Bike) Level 0 x 6 minutes (3/3)   Moist Heat Therapy   Number Minutes Moist Heat 15 Minutes   Moist Heat Location Shoulder   Electrical Stimulation   Electrical Stimulation Location Lt pec/shoulder   Electrical Stimulation Action IFC   Electrical Stimulation Parameters 15 minutes   Electrical Stimulation Goals Pain                  Hailey Hopkins Short Term Goals - 06/20/15 1602    Hailey Hopkins SHORT TERM GOAL #1  Title She will be independent with inital HEP    Time 4   Period Weeks   Status Achieved   Hailey Hopkins SHORT TERM GOAL #2   Title report a 25% reduction in Lt shoulder pain with use at home and work   Time 4   Period Weeks   Status On-going  20%   Hailey Hopkins SHORT TERM GOAL #3   Title demonstrate Lt shoulder AROM IR to L2 at midline to improve use with self-care   Time 4   Period Weeks   Status On-going           Hailey Hopkins Long Term Goals - 05/31/15 1530    Hailey Hopkins LONG TERM GOAL #1   Title be independent in advanced HEP   Time 8   Period Weeks   Status New   Hailey Hopkins LONG TERM GOAL #2   Title improve FOTO to < or = to 46% limitation   Time 8   Period Weeks   Status New   Hailey Hopkins LONG TERM GOAL #3   Title report a 50%  reduction in Lt shoulder pain with work and home tasks   Time 8   Period Weeks   Status New   Hailey Hopkins LONG TERM GOAL #4   Title demonstrate 4+/5 Lt shoulder strength throughout to improve endurance for Lt UE use   Hailey Hopkins LONG TERM GOAL #5   Title able to lift to waist and carry 10 pounds or more to increase home and work participation as MD allows   Time 8   Period Weeks   Status New               Plan - 06/22/15 1531    Clinical Impression Statement Hailey Hopkins had flare-up of Lt shoulder pain when reaching today.  Hailey Hopkins demonstrates painful mobility of Lt arm especially with overhead use.  Hailey Hopkins will continue to benefit from skilled Hailey Hopkins for gentle progression of Lt UE AROM, endurance and strength and pain modalities as needed.     Hailey Hopkins will benefit from skilled therapeutic intervention in order to improve on the following deficits Postural dysfunction;Decreased strength;Impaired flexibility;Pain;Impaired UE functional use;Decreased activity tolerance;Decreased range of motion   Rehab Potential Good   Clinical Impairments Affecting Rehab Potential 11/2014 Lt shoulder surgery of partail tear and distal clavicle excision   Hailey Hopkins Frequency 2x / week   Hailey Hopkins Duration 8 weeks   Hailey Hopkins Treatment/Interventions ADLs/Self Care Home Management;Cryotherapy;Electrical Stimulation;Iontophoresis /ml Dexamethasone;Moist Heat;Therapeutic exercise;Therapeutic activities;Functional mobility training;Ultrasound;Neuromuscular re-education;Patient/family education;Manual techniques;Taping;Dry needling;Passive range of motion   Hailey Hopkins Next Visit Plan Scapular stabs/strength per tolerance. Modalities for pain.    Consulted and Agree with Plan of Care Patient        Problem List Patient Active Problem List   Diagnosis Date Noted  . Insomnia 10/29/2014  . Plantar fasciitis 12/02/2013  . Benign essential HTN 05/28/2013  . Hepatic steatosis 04/12/2011  . Back pain 06/16/2010  . HEMORRHOIDS, EXTERNAL 12/21/2009  . DEPRESSION, MAJOR,  MODERATE 04/04/2007  . HAIR LOSS 08/23/2006  . OBESITY NOS 05/28/2006  . FATIGUE 05/28/2006  . GLUCOSE INTOLERANCE 05/23/2006  . Migraine 05/23/2006    Hailey Hopkins, Hailey Hopkins 06/22/2015 4:12 PM  [facility] [address], [address] [place], [place], [address] Phone: [phone]   Fax:  [phone]  Name: Hailey Hopkins MRN: [id number] Date of Birth: [birth year]

## 2015-06-27 ENCOUNTER — Encounter: Payer: Self-pay | Admitting: Physical Therapy

## 2015-06-29 ENCOUNTER — Ambulatory Visit: Payer: PRIVATE HEALTH INSURANCE | Attending: Surgical

## 2015-06-29 DIAGNOSIS — M6281 Muscle weakness (generalized): Secondary | ICD-10-CM | POA: Diagnosis present

## 2015-06-29 DIAGNOSIS — R293 Abnormal posture: Secondary | ICD-10-CM | POA: Diagnosis present

## 2015-06-29 DIAGNOSIS — M25612 Stiffness of left shoulder, not elsewhere classified: Secondary | ICD-10-CM | POA: Insufficient documentation

## 2015-06-29 DIAGNOSIS — M25512 Pain in left shoulder: Secondary | ICD-10-CM | POA: Insufficient documentation

## 2015-06-29 NOTE — Therapy (Signed)
[facility] [address], [address] [place], [place], [address] Phone: [phone]   Fax:  [phone]  Physical Therapy Treatment  Patient Details  Name: Hailey Hopkins MRN: [id number] Date of Birth: May 03, 1975 Referring Provider: Jones Broom, MD  Encounter Date: 06/29/2015      PT End of Session - 06/29/15 1600    Visit Number 8   Date for PT Re-Evaluation 07/26/15   Authorization Type Workers Comp   Authorization - Visit Number 8   Authorization - Number of Visits 10   PT Start Time 1533  increased pain today so reduced exercise   PT Stop Time 1620   PT Time Calculation (min) 47 min   Activity Tolerance Patient tolerated treatment well   Behavior During Therapy [facility] for tasks assessed/performed      Past Medical History  Diagnosis Date  . Diverticular disease   . Depression     Past Surgical History  Procedure Laterality Date  . Abdominal hysterectomy    . Rotator cuff repair      There were no vitals filed for this visit.  Visit Diagnosis:  Shoulder stiffness, left  Muscle weakness (generalized)  Abnormal posture  Pain in left shoulder      Subjective Assessment - 06/29/15 1535    Subjective Lt shoulder really hurt this morning.  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  Lt shoulder surgery 12/14/2014 for partial rotator cuff repair and distal clavicle excision. March 15th injection into left shoulder under Ultrasound to break up scar tissue   Currently in Pain? Yes   Pain Score 3   7/10 before medication today   Pain Location Shoulder   Pain Orientation Left   Pain Descriptors / Indicators Sore   Pain Type Chronic pain   Pain Onset More than a month ago   Pain Frequency Constant   Aggravating Factors  reaching, moving it   Pain Relieving Factors ice, medication, heat                         [facility] Adult PT Treatment/Exercise -  06/29/15 0001    Shoulder Exercises: Supine   Other Supine Exercises supine on foam roll for pec stretch x 3 minutes.  Then horizonal abduction with red band 2x10   Other Supine Exercises --   Shoulder Exercises: Standing   External Rotation Strengthening;Left;10 reps;Theraband   Theraband Level (Shoulder External Rotation) Level 2 (Red)   Internal Rotation Strengthening;Left;10 reps;Theraband   Theraband Level (Shoulder Internal Rotation) Level 2 (Red)   Flexion AAROM;Strengthening;Left;10 reps  using finger ladder   Shoulder Exercises: Pulleys   Flexion 3 minutes   ABduction 3 minutes   Shoulder Exercises: ROM/Strengthening   UBE (Upper Arm Bike) Level 0 x 6 minutes (3/3)   Moist Heat Therapy   Number Minutes Moist Heat 15 Minutes   Moist Heat Location Shoulder   Electrical Stimulation   Electrical Stimulation Location Lt pec/shoulder   Electrical Stimulation Action IFC   Electrical Stimulation Parameters 15 minutes   Electrical Stimulation Goals Pain                  PT Short Term Goals - 06/29/15 1536    PT SHORT TERM GOAL #1   Title She will be independent with inital HEP    Status Achieved   PT SHORT TERM GOAL #2   Title report a 25% reduction in Lt  shoulder pain with use at home and work   Status Achieved   PT SHORT TERM GOAL #3   Title demonstrate Lt shoulder AROM IR to L2 at midline to improve use with self-care   Time 4   Period Weeks   Status On-going  lateral to L2           PT Long Term Goals - 05/31/15 1530    PT LONG TERM GOAL #1   Title be independent in advanced HEP   Time 8   Period Weeks   Status New   PT LONG TERM GOAL #2   Title improve FOTO to < or = to 46% limitation   Time 8   Period Weeks   Status New   PT LONG TERM GOAL #3   Title report a 50% reduction in Lt shoulder pain with work and home tasks   Time 8   Period Weeks   Status New   PT LONG TERM GOAL #4   Title demonstrate 4+/5 Lt shoulder strength throughout to  improve endurance for Lt UE use   PT LONG TERM GOAL #5   Title able to lift to waist and carry 10 pounds or more to increase home and work participation as MD allows   Time 8   Period Weeks   Status New               Plan - 06/29/15 1539    Clinical Impression Statement Pt reports 30-40% overall improvent in Lt shoulder pain since the start of pain.  Pt with intermittent flare-up of pain up to 7/10 with unknown cause.  Pt reports that her hand isn't as swollen as it has been.  Pt with IR to L2 lateral to the spine which is improved since the start of care.  Pt will continue to benefit from skilled PT to imrpove strength, AROM and pain management as needed to allow for return ot normal use of Lt UE with work and home tasks.     Pt will benefit from skilled therapeutic intervention in order to improve on the following deficits Postural dysfunction;Decreased strength;Impaired flexibility;Pain;Impaired UE functional use;Decreased activity tolerance;Decreased range of motion   Clinical Impairments Affecting Rehab Potential 11/2014 Lt shoulder surgery of partail tear and distal clavicle excision   PT Frequency 2x / week   PT Duration 8 weeks   PT Treatment/Interventions ADLs/Self Care Home Management;Cryotherapy;Electrical Stimulation;Iontophoresis 4mg /ml Dexamethasone;Moist Heat;Therapeutic exercise;Therapeutic activities;Functional mobility training;Ultrasound;Neuromuscular re-education;Patient/family education;Manual techniques;Taping;Dry needling;Passive range of motion   PT Next Visit Plan Scapular stabs/strength per tolerance. Modalities for pain.    Consulted and Agree with Plan of Care Patient        Problem List Patient Active Problem List   Diagnosis Date Noted  . Insomnia 10/29/2014  . Plantar fasciitis 12/02/2013  . Benign essential HTN 05/28/2013  . Hepatic steatosis 04/12/2011  . Back pain 06/16/2010  . HEMORRHOIDS, EXTERNAL 12/21/2009  . DEPRESSION, MAJOR, MODERATE  04/04/2007  . HAIR LOSS 08/23/2006  . OBESITY NOS 05/28/2006  . FATIGUE 05/28/2006  . GLUCOSE INTOLERANCE 05/23/2006  . Migraine 05/23/2006    Lorrene ReidKelly Takacs, PT 06/29/2015 4:02 PM  [facility] [address], [address] [place], [place], [address] Phone: [phone]   Fax:  [phone]  Name: Hailey Hopkins MRN: [id number] Date of Birth: 02-04-1976

## 2015-07-04 ENCOUNTER — Encounter: Payer: Self-pay | Admitting: Physical Therapy

## 2015-07-05 ENCOUNTER — Ambulatory Visit: Payer: PRIVATE HEALTH INSURANCE | Admitting: Physical Therapy

## 2015-07-07 ENCOUNTER — Encounter: Payer: Self-pay | Admitting: Physical Therapy

## 2015-07-07 ENCOUNTER — Ambulatory Visit: Payer: PRIVATE HEALTH INSURANCE | Admitting: Physical Therapy

## 2015-07-07 DIAGNOSIS — M25612 Stiffness of left shoulder, not elsewhere classified: Secondary | ICD-10-CM | POA: Diagnosis not present

## 2015-07-07 DIAGNOSIS — R293 Abnormal posture: Secondary | ICD-10-CM

## 2015-07-07 DIAGNOSIS — M25512 Pain in left shoulder: Secondary | ICD-10-CM

## 2015-07-07 DIAGNOSIS — M6281 Muscle weakness (generalized): Secondary | ICD-10-CM

## 2015-07-07 NOTE — Therapy (Signed)
[facility] [address], [address] [place], [place], [address] Phone: [phone]   Fax:  [phone]  Physical Therapy Treatment  Patient Details  Name: Hailey Hopkins MRN: [id number] Date of Birth: [birth year] Referring Provider: Jones Broomhandler, Justin, MD  Encounter Date: 07/07/2015      PT End of Session - 07/07/15 1025    Visit Number 9   Date for PT Re-Evaluation 08/10/15   Authorization Type Workers Comp   Authorization - Visit Number 9   Authorization - Number of Visits 10   PT Start Time 1015   PT Stop Time 1118   PT Time Calculation (min) 63 min   Activity Tolerance Patient tolerated treatment well   Behavior During Therapy [facility] for tasks assessed/performed      Past Medical History  Diagnosis Date  . Diverticular disease   . Depression     Past Surgical History  Procedure Laterality Date  . Abdominal hysterectomy    . Rotator cuff repair      There were no vitals filed for this visit.      Subjective Assessment - 07/07/15 1022    Subjective Pain in left shoulder is rated as 4-5/10, reaching is still challenging.    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  Lt shoulder surgery 12/14/2014 for partial rotator cuff repair and distal clavicle excision. March 15th injection into left shoulder under Ultrasound to break up scar tissue   Diagnostic tests injection: Cortizone 04/2015   Patient Stated Goals reduce pain, return to normal use of Lt UE   Currently in Pain? Yes   Pain Score 4    Pain Location Shoulder   Pain Orientation Left   Pain Descriptors / Indicators Sore   Pain Type Chronic pain   Pain Onset More than a month ago   Pain Frequency Constant   Aggravating Factors  reaching, moving it   Pain Relieving Factors ice, medication, heat   Multiple Pain Sites No            [facility] PT Assessment - 07/07/15 0001    Assessment   Medical Diagnosis post-op stiffness of Lt shoulder,  Lt pectoralis strain, distal clavicle excision, repair of partial tear of rotator cuff and bone spur   Onset Date/Surgical Date 12/14/14   Hand Dominance Right   Next MD Visit 08/05/15   Prior Function   Level of Independence Independent   Vocation Full time employment   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 [employer]   Cognition   Overall Cognitive Status Within Functional Limits for tasks assessed   ROM / Strength   AROM / PROM / Strength AROM   AROM   Overall AROM  Deficits   Overall AROM Comments Rt shoulder full AROM without pain   AROM Assessment Site Shoulder   Right/Left Shoulder Left   Left Shoulder Flexion 135 Degrees   Left Shoulder ABduction 100 Degrees  with pain on clavicle at available end range   Left Shoulder Internal Rotation --  lacking 4 inches compare to Rt side   Left Shoulder External Rotation --  reaching T2, with discomfort in clavicle    Strength   Overall Strength Deficits   Overall Strength Comments Rt shoulder strength 5/5   Strength Assessment Site Shoulder   Right/Left Shoulder Left   Left Shoulder Flexion 4/5   Left Shoulder ABduction 4/5   Left Shoulder Internal Rotation 4+/5   Left Shoulder External Rotation 4+/5   Palpation  Palpation comment Pt with diffuse palpable tenderness and active trigger points over Lt pectoralis, upper  trap, axilla and distal clavicle                     [facility] Adult PT Treatment/Exercise - 07/07/15 0001    Shoulder Exercises: Supine   Other Supine Exercises supine on foam roll for pec stretch x 3 minutes.  Then horizonal abduction with red band 2x10   Shoulder Exercises: Standing   External Rotation Strengthening;Left;10 reps;Theraband   Theraband Level (Shoulder External Rotation) Level 2 (Red)   Internal Rotation Strengthening;Left;10 reps;Theraband   Theraband Level (Shoulder Internal Rotation) Level 2 (Red)   Flexion AAROM;Strengthening;Left;10 reps   Shoulder Exercises: Pulleys    Flexion 3 minutes   ABduction 3 minutes   Shoulder Exercises: ROM/Strengthening   UBE (Upper Arm Bike) Level 2 x 6 minutes (3/3)   Moist Heat Therapy   Number Minutes Moist Heat 15 Minutes   Moist Heat Location Shoulder   Electrical Stimulation   Electrical Stimulation Location Lt pec/shoulder   Electrical Stimulation Action IFC   Electrical Stimulation Parameters 15   Electrical Stimulation Goals Pain                  PT Short Term Goals - 07/07/15 1049    PT SHORT TERM GOAL #1   Title She will be independent with inital HEP    Baseline can reproduce correctly without cues.   Time 4   Period Weeks   Status Achieved   PT SHORT TERM GOAL #2   Title report a 25% reduction in Lt shoulder pain with use at home and work   Baseline AA arm to fix hair   Time 4   Period Weeks   Status Achieved   PT SHORT TERM GOAL #3   Title demonstrate Lt shoulder AROM IR to L2 at midline to improve use with self-care   Baseline understands and uses RICE at home   Time 4   Period Weeks   Status Achieved           PT Long Term Goals - 05/31/15 1530    PT LONG TERM GOAL #1   Title be independent in advanced HEP   Time 8   Period Weeks   Status New   PT LONG TERM GOAL #2   Title improve FOTO to < or = to 46% limitation   Time 8   Period Weeks   Status New   PT LONG TERM GOAL #3   Title report a 50% reduction in Lt shoulder pain with work and home tasks   Time 8   Period Weeks   Status New   PT LONG TERM GOAL #4   Title demonstrate 4+/5 Lt shoulder strength throughout to improve endurance for Lt UE use   PT LONG TERM GOAL #5   Title able to lift to waist and carry 10 pounds or more to increase home and work participation as MD allows   Time 8   Period Weeks   Status New               Plan - 07/07/15 1028    Clinical Impression Statement Pt reports 40% overall improvement in Lt shoulder pain since start of PT. Left hand is swelling up with activities or walking.  AROM Lt shoulder in degrees, flexion 135, abduction 100, IR lacking 4" compare to Rt with pain, ER T2 with discomfot on clavicle. Pt will  continue to benefit from skilled Pt to  advance ROM, increases strength , scar release and pain managment to return to normal use of Lt UE   Rehab Potential Good   Clinical Impairments Affecting Rehab Potential 11/2014 Lt shoulder surgery of partail tear and distal clavicle excision   PT Frequency 2x / week   PT Duration 8 weeks   PT Treatment/Interventions ADLs/Self Care Home Management;Cryotherapy;Electrical Stimulation;Iontophoresis /ml Dexamethasone;Moist Heat;Therapeutic exercise;Therapeutic activities;Functional mobility training;Ultrasound;Neuromuscular re-education;Patient/family education;Manual techniques;Taping;Dry needling;Passive range of motion   PT Next Visit Plan Scapular stabs/strength per tolerance. Modalities for pain.    Consulted and Agree with Plan of Care Patient      Patient will benefit from skilled therapeutic intervention in order to improve the following deficits and impairments:  Postural dysfunction, Decreased strength, Impaired flexibility, Pain, Impaired UE functional use, Decreased activity tolerance, Decreased range of motion  Visit Diagnosis: Muscle weakness (generalized) - Plan: PT plan of care cert/re-cert  Shoulder stiffness, left - Plan: PT plan of care cert/re-cert  Abnormal posture - Plan: PT plan of care cert/re-cert  Pain in left shoulder - Plan: PT plan of care cert/re-cert     Problem List Patient Active Problem List   Diagnosis Date Noted  . Insomnia 10/29/2014  . Plantar fasciitis 12/02/2013  . Benign essential HTN 05/28/2013  . Hepatic steatosis 04/12/2011  . Back pain 06/16/2010  . HEMORRHOIDS, EXTERNAL 12/21/2009  . DEPRESSION, MAJOR, MODERATE 04/04/2007  . HAIR LOSS 08/23/2006  . OBESITY NOS 05/28/2006  . FATIGUE 05/28/2006  . GLUCOSE INTOLERANCE 05/23/2006  . Migraine 05/23/2006     NAUMANN-HOUEGNIFIO,Jossalyn Forgione PTA 07/07/2015, 11:03 AM  [facility] [address] [place], [place], [address] Phone: [phone]   Fax:  [phone]  Name: Hailey Hopkins MRN: [id number] Date of Birth: June 25, 1975

## 2015-07-12 ENCOUNTER — Encounter: Payer: Self-pay | Admitting: Physical Therapy

## 2015-07-12 ENCOUNTER — Ambulatory Visit: Payer: PRIVATE HEALTH INSURANCE | Admitting: Physical Therapy

## 2015-07-12 DIAGNOSIS — M25612 Stiffness of left shoulder, not elsewhere classified: Secondary | ICD-10-CM | POA: Diagnosis not present

## 2015-07-12 DIAGNOSIS — M6281 Muscle weakness (generalized): Secondary | ICD-10-CM

## 2015-07-12 DIAGNOSIS — M25512 Pain in left shoulder: Secondary | ICD-10-CM

## 2015-07-12 DIAGNOSIS — R293 Abnormal posture: Secondary | ICD-10-CM

## 2015-07-12 NOTE — Therapy (Signed)
[facility] [address], [address] [place], [place], [address] Phone: [phone]   Fax:  [phone]  Physical Therapy Treatment  Patient Details  Name: Hailey ButteryFelicia G Benner MRN: [id number] Date of Birth: [birth year] Referring Provider: Jones Broomhandler, Justin, MD  Encounter Date: 07/12/2015      PT End of Session - 07/12/15 1405    Visit Number 10   Date for PT Re-Evaluation 08/10/15   Authorization Type Workers Comp   Authorization - Visit Number 10   Authorization - Number of Visits 10   PT Start Time 1358   PT Stop Time 1459   PT Time Calculation (min) 61 min   Activity Tolerance Patient tolerated treatment well   Behavior During Therapy [facility] for tasks assessed/performed      Past Medical History  Diagnosis Date  . Diverticular disease   . Depression     Past Surgical History  Procedure Laterality Date  . Abdominal hysterectomy    . Rotator cuff repair      There were no vitals filed for this visit.      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  Lt shoulder surgery 12/14/2014 for partial rotator cuff repair and distal clavicle excision. March 15th injection into left shoulder under Ultrasound to break up scar tissue   Diagnostic tests injection: Cortizone 04/2015   Patient Stated Goals reduce pain, return to normal use of Lt UE   Currently in Pain? Yes   Pain Score 3    Pain Location Shoulder   Pain Orientation Left   Pain Descriptors / Indicators Sore   Pain Type Chronic pain   Pain Onset More than a month ago   Pain Frequency Constant   Aggravating Factors  reaching, moving it   Pain Relieving Factors ice, medication, heat   Multiple Pain Sites No                         [facility] Adult PT Treatment/Exercise - 07/12/15 0001    Posture/Postural  Control   Posture/Postural Control Postural limitations   Postural Limitations Rounded Shoulders;Forward head   Exercises   Exercises Shoulder   Shoulder Exercises: Supine   Other Supine Exercises supine on foam roll for pec stretch x 3 minutes.  Then horizonal abduction with red band 2x10   Other Supine Exercises snow angels on foam roll, partial range x 10   Shoulder Exercises: Standing   External Rotation Strengthening;Left;10 reps;Theraband   Theraband Level (Shoulder External Rotation) Level 2 (Red)   Internal Rotation Strengthening;Left;10 reps;Theraband   Theraband Level (Shoulder Internal Rotation) Level 2 (Red)   Flexion Strengthening;Both;20 reps;Theraband  red t-band   Extension Strengthening;Both;20 reps;Theraband  red   Other Standing Exercises Bil arm circles x 10 challenging for left shoulder   Other Standing Exercises chickenwings in front of mirrow x 10   Shoulder Exercises: Pulleys   Flexion 3 minutes   ABduction 3 minutes   Shoulder Exercises: ROM/Strengthening   UBE (Upper Arm Bike) Level 2 x 6 minutes (3/3)   Moist Heat Therapy   Number Minutes Moist Heat 15 Minutes   Moist Heat Location Shoulder   Electrical Stimulation   Electrical Stimulation Location Lt pec/shoulder   Electrical Stimulation Action IFC   Electrical Stimulation Parameters 15   Electrical Stimulation Goals Pain  Iontophoresis   Type of Iontophoresis --  Ionto is dicontinued due to skin iritation                  PT Short Term Goals - 07/12/15 1412    PT SHORT TERM GOAL #1   Title She will be independent with inital HEP    Baseline can reproduce correctly without cues.   Time 4   Period Weeks   Status Achieved   PT SHORT TERM GOAL #2   Title report a 25% reduction in Lt shoulder pain with use at home and work   Baseline AA arm to fix hair   Time 4   Period Weeks   Status Achieved   PT SHORT TERM GOAL #3   Title demonstrate Lt shoulder AROM IR to L2 at midline to  improve use with self-care   Baseline understands and uses RICE at home   Time 4   Period Weeks   Status Achieved           PT Long Term Goals - 05/31/15 1530    PT LONG TERM GOAL #1   Title be independent in advanced HEP   Time 8   Period Weeks   Status New   PT LONG TERM GOAL #2   Title improve FOTO to < or = to 46% limitation   Time 8   Period Weeks   Status New   PT LONG TERM GOAL #3   Title report a 50% reduction in Lt shoulder pain with work and home tasks   Time 8   Period Weeks   Status New   PT LONG TERM GOAL #4   Title demonstrate 4+/5 Lt shoulder strength throughout to improve endurance for Lt UE use   PT LONG TERM GOAL #5   Title able to lift to waist and carry 10 pounds or more to increase home and work participation as MD allows   Time 8   Period Weeks   Status New               Plan - 07/12/15 1406    Clinical Impression Statement The swelling in the left hand is still present, pt advised to raise arm above head and move the fingers as often as possible. Pt with pain on Lt sternoclacivular joint with ER/ABD and flexion. Pt will continue to benefit from skilled PT to advance ROM, increase strength, scar release and pain managment to return to normal use of Lt UE.    Rehab Potential Good   Clinical Impairments Affecting Rehab Potential 11/2014 Lt shoulder surgery of partail tear and distal clavicle excision   PT Frequency 2x / week   PT Duration 8 weeks   PT Treatment/Interventions ADLs/Self Care Home Management;Cryotherapy;Electrical Stimulation;Iontophoresis /ml Dexamethasone;Moist Heat;Therapeutic exercise;Therapeutic activities;Functional mobility training;Ultrasound;Neuromuscular re-education;Patient/family education;Manual techniques;Taping;Dry needling;Passive range of motion   PT Next Visit Plan Scapular stabs/strength per tolerance. Modalities for pain.    Consulted and Agree with Plan of Care Patient      Patient will benefit from  skilled therapeutic intervention in order to improve the following deficits and impairments:  Postural dysfunction, Decreased strength, Impaired flexibility, Pain, Impaired UE functional use, Decreased activity tolerance, Decreased range of motion  Visit Diagnosis: Muscle weakness (generalized)  Shoulder stiffness, left  Abnormal posture  Pain in left shoulder     Problem List Patient Active Problem List   Diagnosis Date Noted  . Insomnia 10/29/2014  . Plantar fasciitis 12/02/2013  . Benign essential  HTN 05/28/2013  . Hepatic steatosis 04/12/2011  . Back pain 06/16/2010  . HEMORRHOIDS, EXTERNAL 12/21/2009  . DEPRESSION, MAJOR, MODERATE 04/04/2007  . HAIR LOSS 08/23/2006  . OBESITY NOS 05/28/2006  . FATIGUE 05/28/2006  . GLUCOSE INTOLERANCE 05/23/2006  . Migraine 05/23/2006    Nevelyn Mellott Naumann-Houegnifio, PTA 07/12/2015 2:44 PM   [facility] [address] [place], [place], [address] Phone: [phone]   Fax:  [phone]  Name: EZMAE SPEERS MRN: [id number] Date of Birth: 1976/02/11

## 2015-07-14 ENCOUNTER — Telehealth: Payer: Self-pay | Admitting: Physical Therapy

## 2015-07-14 ENCOUNTER — Encounter: Payer: Self-pay | Admitting: Physical Therapy

## 2015-07-14 ENCOUNTER — Ambulatory Visit: Payer: PRIVATE HEALTH INSURANCE | Admitting: Physical Therapy

## 2015-07-14 NOTE — Telephone Encounter (Signed)
Pt was a no-show for appointment on Thursday 20th, for 10:15. PTA called and left message on voice mail to return our call. Desiree HaneElke Naumann Houegnifio, PTA

## 2015-07-19 ENCOUNTER — Ambulatory Visit: Payer: PRIVATE HEALTH INSURANCE

## 2015-07-19 DIAGNOSIS — M6281 Muscle weakness (generalized): Secondary | ICD-10-CM

## 2015-07-19 DIAGNOSIS — M25512 Pain in left shoulder: Secondary | ICD-10-CM

## 2015-07-19 DIAGNOSIS — M25612 Stiffness of left shoulder, not elsewhere classified: Secondary | ICD-10-CM | POA: Diagnosis not present

## 2015-07-19 DIAGNOSIS — R293 Abnormal posture: Secondary | ICD-10-CM

## 2015-07-19 NOTE — Patient Instructions (Signed)

## 2015-07-19 NOTE — Therapy (Signed)
[facility] [address], [address] [place], [place], [address] Phone: [phone]   Fax:  [phone]  Physical Therapy Treatment  Patient Details  Name: Hailey Hopkins MRN: [id number] Date of Birth: Jan 15, 1976 Referring Provider: Jones Broomhandler, Justin, MD  Encounter Date: 07/19/2015      PT End of Session - 07/19/15 1058    Visit Number 11   Date for PT Re-Evaluation 08/10/15   Authorization Type Workers Comp   Authorization - Visit Number 11   Authorization - Number of Visits 20   PT Start Time 1016   PT Stop Time 1113   PT Time Calculation (min) 57 min   Activity Tolerance Patient tolerated treatment well   Behavior During Therapy [facility] for tasks assessed/performed      Past Medical History  Diagnosis Date  . Diverticular disease   . Depression     Past Surgical History  Procedure Laterality Date  . Abdominal hysterectomy    . Rotator cuff repair      There were no vitals filed for this visit.      Subjective Assessment - 07/19/15 1019    Subjective Pt missed last appointment due to kidney stones.  Overdid it with Lt shoulder stretching   Currently in Pain? Yes   Pain Location Shoulder   Pain Orientation Left   Pain Descriptors / Indicators Sore   Pain Type Chronic pain   Pain Onset More than a month ago   Pain Frequency Constant   Aggravating Factors  overdoing it with exercise, moving the Lt shoulder   Pain Relieving Factors ice, medication, heat                         [facility] Adult PT Treatment/Exercise - 07/19/15 0001    Shoulder Exercises: Pulleys   Flexion 3 minutes   ABduction 3 minutes   Shoulder Exercises: ROM/Strengthening   UBE (Upper Arm Bike) Level 2 x 6 minutes (3/3)   Moist Heat Therapy   Number Minutes Moist Heat 15 Minutes   Moist Heat Location Shoulder   Electrical Stimulation   Electrical Stimulation Location Lt pec/shoulder   Electrical Stimulation Action IFC    Electrical Stimulation Parameters 15   Electrical Stimulation Goals Pain          Trigger Point Dry Needling - 07/19/15 1028    Consent Given? Yes   Education Handout Provided Yes   Muscles Treated Upper Body Pectoralis major;Pectoralis minor  Lt anterior deltoid- twitch response and increased length    Pectoralis Major Response Twitch response elicited;Palpable increased muscle length   Pectoralis Minor Response Twitch response elicited;Palpable increased muscle length              PT Education - 07/19/15 1030    Education provided Yes   Education Details Dry needling education   Person(s) Educated Patient   Methods Explanation;Handout   Comprehension Verbalized understanding          PT Short Term Goals - 07/19/15 1022    PT SHORT TERM GOAL #1   Title She will be independent with inital HEP    Status Achieved           PT Long Term Goals - 07/19/15 1021    PT LONG TERM GOAL #1   Title be independent in advanced HEP   Time 8   Period Weeks   Status On-going   PT LONG TERM GOAL #3   Title  report a 50% reduction in Lt shoulder pain with work and home tasks   Time 8   Period Weeks   Status On-going  30-40% reduction   PT LONG TERM GOAL #5   Title able to lift to waist and carry 10 pounds or more to increase home and work participation as MD allows   Time 8   Period Weeks   Status On-going               Plan - 07/19/15 1022    Clinical Impression Statement Pt reports 30-40% overall improvement in Lt shoulder symptoms since the start of care. Pt has been less active due to kidney stones and hasn't been working due to limitations with Lt shoulder.  Pt has been performing exercises at home for strength and flexiblity.  Pt with active trigger points in Lt shoulder and responded well to dry needling today with imrpoved tissue length and reduced pain after treatment.  Pt will continue to benefit from skilled PT for strength, AROM, manual and  modalities.     Rehab Potential Good   Clinical Impairments Affecting Rehab Potential 11/2014 Lt shoulder surgery of partail tear and distal clavicle excision   PT Frequency 2x / week   PT Duration 8 weeks   PT Treatment/Interventions ADLs/Self Care Home Management;Cryotherapy;Electrical Stimulation;Iontophoresis /ml Dexamethasone;Moist Heat;Therapeutic exercise;Therapeutic activities;Functional mobility training;Ultrasound;Neuromuscular re-education;Patient/family education;Manual techniques;Taping;Dry needling;Passive range of motion   PT Next Visit Plan See how pt responds to DN1. Scapular stabs/strength per tolerance. Modalities for pain.    Consulted and Agree with Plan of Care Patient      Patient will benefit from skilled therapeutic intervention in order to improve the following deficits and impairments:     Visit Diagnosis: Muscle weakness (generalized)  Shoulder stiffness, left  Abnormal posture  Pain in left shoulder     Problem List Patient Active Problem List   Diagnosis Date Noted  . Insomnia 10/29/2014  . Plantar fasciitis 12/02/2013  . Benign essential HTN 05/28/2013  . Hepatic steatosis 04/12/2011  . Back pain 06/16/2010  . HEMORRHOIDS, EXTERNAL 12/21/2009  . DEPRESSION, MAJOR, MODERATE 04/04/2007  . HAIR LOSS 08/23/2006  . OBESITY NOS 05/28/2006  . FATIGUE 05/28/2006  . GLUCOSE INTOLERANCE 05/23/2006  . Migraine 05/23/2006     [name], PT 07/19/2015 11:00 AM  [facility] [address] [place], [place], [address] Phone: [phone]   Fax:  [phone]  Name: Hailey Hopkins MRN: [id number] Date of Birth: [birth year]

## 2015-07-21 ENCOUNTER — Ambulatory Visit: Payer: PRIVATE HEALTH INSURANCE

## 2015-07-21 DIAGNOSIS — M25612 Stiffness of left shoulder, not elsewhere classified: Secondary | ICD-10-CM | POA: Diagnosis not present

## 2015-07-21 DIAGNOSIS — M6281 Muscle weakness (generalized): Secondary | ICD-10-CM

## 2015-07-21 DIAGNOSIS — R293 Abnormal posture: Secondary | ICD-10-CM

## 2015-07-21 DIAGNOSIS — M25512 Pain in left shoulder: Secondary | ICD-10-CM

## 2015-07-21 NOTE — Therapy (Signed)
[facility] [address], [address] [place], [place], [address] Phone: [phone]   Fax:  [phone]  Physical Therapy Treatment  Patient Details  Name: Hailey Hopkins MRN: [id number] Date of Birth: 14-Oct-1975 Referring Provider: Jones Broomhandler, Justin, MD  Encounter Date: 07/21/2015      Hailey Hopkins End of Session - 07/21/15 1044    Visit Number 12   Date for Hailey Hopkins Re-Evaluation 08/10/15   Authorization Type Workers Comp   Authorization - Visit Number 12   Authorization - Number of Visits 20   Hailey Hopkins Start Time 1016  increased pain today   Hailey Hopkins Stop Time 1103   Hailey Hopkins Time Calculation (min) 47 min   Activity Tolerance Patient tolerated treatment well   Behavior During Therapy [facility] for tasks assessed/performed      Past Medical History  Diagnosis Date  . Diverticular disease   . Depression     Past Surgical History  Procedure Laterality Date  . Abdominal hysterectomy    . Rotator cuff repair      There were no vitals filed for this visit.      Subjective Assessment - 07/21/15 1017    Subjective Lt pec is sore after needling.     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  Lt shoulder surgery 12/14/2014 for partial rotator cuff repair and distal clavicle excision. March 15th injection into left shoulder under Ultrasound to break up scar tissue   Currently in Pain? Yes   Pain Score 6    Pain Location Shoulder   Pain Orientation Left   Pain Descriptors / Indicators Sore   Pain Type Chronic pain   Pain Onset More than a month ago   Pain Frequency Constant   Aggravating Factors  overdoing it with exercise, moving the Lt shoulder   Pain Relieving Factors ice, medication, heat                         [facility] Adult Hailey Hopkins Treatment/Exercise - 07/21/15 0001    Shoulder Exercises: Supine   Other Supine Exercises supine on foam roll for pec stretch x 3 minutes.  Then overhead flexion and snow angels 2x10 each    Shoulder Exercises: Standing   Other Standing Exercises Bil arm circles x 10 challenging for left shoulder   Shoulder Exercises: Pulleys   Flexion 3 minutes   ABduction 3 minutes   Shoulder Exercises: ROM/Strengthening   UBE (Upper Arm Bike) Level 2 x 6 minutes (3/3)   Wall Pushups 20 reps   Shoulder Exercises: Stretch   Corner Stretch 3 reps;20 seconds   Moist Heat Therapy   Number Minutes Moist Heat 15 Minutes   Moist Heat Location Shoulder   Electrical Stimulation   Electrical Stimulation Location Lt pec/shoulder   Electrical Stimulation Action IFC   Electrical Stimulation Parameters 15   Electrical Stimulation Goals Pain                  Hailey Hopkins Short Term Goals - 07/19/15 1022    Hailey Hopkins SHORT TERM GOAL #1   Title She will be independent with inital HEP    Status Achieved           Hailey Hopkins Long Term Goals - 07/19/15 1021    Hailey Hopkins LONG TERM GOAL #1   Title be independent in advanced HEP   Time 8   Period Weeks   Status On-going   Hailey Hopkins LONG TERM GOAL #3   Title report  a 50% reduction in Lt shoulder pain with work and home tasks   Time 8   Period Weeks   Status On-going  30-40% reduction   Hailey Hopkins LONG TERM GOAL #5   Title able to lift to waist and carry 10 pounds or more to increase home and work participation as MD allows   Time 8   Period Weeks   Status On-going               Plan - 07/21/15 1023    Clinical Impression Statement Hailey Hopkins reports 30-40% overall improvement in Lt shoulder symptoms since the start of care.  Hailey Hopkins with increased pectoralis tissue tenderness s/p dry needling last session.  Hailey Hopkins focused on flexiblity and gentle mobility today.  Hailey Hopkins with active trigger points in Lt shoulder and will continue to benefit from skilled Hailey Hopkins for strength, AROM, manual and modalities.     Rehab Potential Good   Clinical Impairments Affecting Rehab Potential 11/2014 Lt shoulder surgery of partail tear and distal clavicle excision   Hailey Hopkins Frequency 2x / week   Hailey Hopkins Duration 8  weeks   Hailey Hopkins Treatment/Interventions ADLs/Self Care Home Management;Cryotherapy;Electrical Stimulation;Iontophoresis /ml Dexamethasone;Moist Heat;Therapeutic exercise;Therapeutic activities;Functional mobility training;Ultrasound;Neuromuscular re-education;Patient/family education;Manual techniques;Taping;Dry needling;Passive range of motion   Hailey Hopkins Next Visit Plan Dry needling if helpful, flexibility, strength, modalities and manual PRN   Consulted and Agree with Plan of Care Patient      Patient will benefit from skilled therapeutic intervention in order to improve the following deficits and impairments:  Postural dysfunction, Decreased strength, Impaired flexibility, Pain, Impaired UE functional use, Decreased activity tolerance, Decreased range of motion  Visit Diagnosis: Muscle weakness (generalized)  Shoulder stiffness, left  Abnormal posture  Pain in left shoulder     Problem List Patient Active Problem List   Diagnosis Date Noted  . Insomnia 10/29/2014  . Plantar fasciitis 12/02/2013  . Benign essential HTN 05/28/2013  . Hepatic steatosis 04/12/2011  . Back pain 06/16/2010  . HEMORRHOIDS, EXTERNAL 12/21/2009  . DEPRESSION, MAJOR, MODERATE 04/04/2007  . HAIR LOSS 08/23/2006  . OBESITY NOS 05/28/2006  . FATIGUE 05/28/2006  . GLUCOSE INTOLERANCE 05/23/2006  . Migraine 05/23/2006    Hailey Hopkins, Hailey Hopkins 07/21/2015 10:51 AM  [facility] [address] [place], [place], [address] Phone: [phone]   Fax:  [phone]  Name: Hailey Hopkins MRN: [id number] Date of Birth: 1975-12-05

## 2015-07-26 ENCOUNTER — Ambulatory Visit: Payer: PRIVATE HEALTH INSURANCE | Attending: Surgical

## 2015-07-26 DIAGNOSIS — M6281 Muscle weakness (generalized): Secondary | ICD-10-CM | POA: Insufficient documentation

## 2015-07-26 DIAGNOSIS — M25512 Pain in left shoulder: Secondary | ICD-10-CM | POA: Insufficient documentation

## 2015-07-26 DIAGNOSIS — M25612 Stiffness of left shoulder, not elsewhere classified: Secondary | ICD-10-CM | POA: Insufficient documentation

## 2015-07-26 DIAGNOSIS — R293 Abnormal posture: Secondary | ICD-10-CM | POA: Insufficient documentation

## 2015-07-28 ENCOUNTER — Ambulatory Visit: Payer: PRIVATE HEALTH INSURANCE | Admitting: Physical Therapy

## 2015-07-28 DIAGNOSIS — M25512 Pain in left shoulder: Secondary | ICD-10-CM | POA: Diagnosis present

## 2015-07-28 DIAGNOSIS — M6281 Muscle weakness (generalized): Secondary | ICD-10-CM | POA: Diagnosis present

## 2015-07-28 DIAGNOSIS — R293 Abnormal posture: Secondary | ICD-10-CM | POA: Diagnosis present

## 2015-07-28 DIAGNOSIS — M25612 Stiffness of left shoulder, not elsewhere classified: Secondary | ICD-10-CM

## 2015-07-28 NOTE — Therapy (Signed)
[facility] [address] [place], [place], [address] Phone: [phone]   Fax:  [phone]  Physical Therapy Treatment  Patient Details  Name: Hailey Hopkins MRN: [id number] Date of Birth: 16-Nov-1975 Referring Provider: Jones Broom, MD  Encounter Date: 07/28/2015      PT End of Session - 07/28/15 1032    Visit Number 13   Date for PT Re-Evaluation 08/10/15   Authorization Type Workers Comp   Authorization - Visit Number 13   Authorization - Number of Visits 29   PT Start Time 1015   PT Stop Time 1117   PT Time Calculation (min) 62 min   Activity Tolerance Patient tolerated treatment well   Behavior During Therapy [facility] for tasks assessed/performed      Past Medical History  Diagnosis Date  . Diverticular disease   . Depression     Past Surgical History  Procedure Laterality Date  . Abdominal hysterectomy    . Rotator cuff repair      There were no vitals filed for this visit.      Subjective Assessment - 07/28/15 1027    Subjective Lt pectoralis is sore today.    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  Lt shoulder surgery 12/14/2014 for partial rotator cuff repair and distal clavicle excision. March 15th injection into left shoulder under Ultrasound to break up scar tissue   Diagnostic tests injection: Cortizone 04/2015   Patient Stated Goals reduce pain, return to normal use of Lt UE   Currently in Pain? Yes   Pain Score 5    Pain Location Shoulder   Pain Orientation Left   Pain Descriptors / Indicators Sore   Pain Type Chronic pain   Pain Onset More than a month ago   Pain Frequency Constant   Aggravating Factors  overdoing it with exercise, moving the Lt shoulder   Pain Relieving Factors ice, medication, heat   Multiple Pain Sites No                         [facility] Adult PT Treatment/Exercise - 07/28/15 0001    Posture/Postural Control   Posture/Postural Control Postural limitations   Postural Limitations Rounded Shoulders;Forward head   Exercises   Exercises Shoulder   Shoulder Exercises: Supine   Other Supine Exercises supine on foam roll for pec stretch x 3 minutes.  Then overhead flexion and snow angels 2x10 each   Shoulder Exercises: Pulleys   Flexion 2 minutes   ABduction 2 minutes   Other Pulley Exercises IR   Shoulder Exercises: ROM/Strengthening   UBE (Upper Arm Bike) Level 2 x 6 minutes (3/3)   Wall Pushups 20 reps   Shoulder Exercises: Stretch   Corner Stretch 3 reps;20 seconds   Moist Heat Therapy   Number Minutes Moist Heat 15 Minutes   Moist Heat Location Shoulder   Electrical Stimulation   Electrical Stimulation Location Lt pec/shoulder   Electrical Stimulation Action IFC   Electrical Stimulation Parameters 15   Electrical Stimulation Goals Pain   Manual Therapy   Manual Therapy Soft tissue mobilization   Soft tissue mobilization pectoralis and deltoid and ant capsule, with PROM in supine                  PT Short Term Goals - 07/19/15 1022    PT SHORT TERM GOAL #1   Title She will be independent with inital HEP  Status Achieved           PT Long Term Goals - 07/28/15 1039    PT LONG TERM GOAL #1   Title be independent in advanced HEP   Time 8   Period Weeks   Status On-going   PT LONG TERM GOAL #2   Title improve FOTO to < or = to 46% limitation   Time 8   Period Weeks   Status On-going   PT LONG TERM GOAL #3   Title report a 50% reduction in Lt shoulder pain with work and home tasks  30-40% as of Jul 28, 2015   Baseline more painful in the last week   Time 8   Period Weeks   Status On-going   PT LONG TERM GOAL #4   Title demonstrate 4+/5 Lt shoulder strength throughout to improve endurance for Lt UE use   Baseline Pain with endrange left shoulder flexion   Time 6   Period Weeks   Status On-going   PT LONG TERM GOAL #5   Title able to lift to waist and  carry 10 pounds or more to increase home and work participation as MD allows   Baseline able to do in clinic    Time 8   Period Weeks   Status On-going               Plan - 07/28/15 1033    Clinical Impression Statement Pt has no major improvement to report in shoulder symptoms since last week. Pt focused on releasing muscle tension in pectoralis area. Pt will continue to benefit from skilled PT for strength, AROM, manual and modalities   Rehab Potential Good   Clinical Impairments Affecting Rehab Potential 11/2014 Lt shoulder surgery of partail tear and distal clavicle excision   PT Frequency 2x / week   PT Duration 8 weeks   PT Treatment/Interventions ADLs/Self Care Home Management;Cryotherapy;Electrical Stimulation;Iontophoresis 4mg /ml Dexamethasone;Moist Heat;Therapeutic exercise;Therapeutic activities;Functional mobility training;Ultrasound;Neuromuscular re-education;Patient/family education;Manual techniques;Taping;Dry needling;Passive range of motion   PT Next Visit Plan Dry needling if helpful, flexibility, strength, modalities and manual PRN   Consulted and Agree with Plan of Care Patient      Patient will benefit from skilled therapeutic intervention in order to improve the following deficits and impairments:  Postural dysfunction, Decreased strength, Impaired flexibility, Pain, Impaired UE functional use, Decreased activity tolerance, Decreased range of motion  Visit Diagnosis: Shoulder stiffness, left  Muscle weakness (generalized)  Abnormal posture  Pain in left shoulder     Problem List Patient Active Problem List   Diagnosis Date Noted  . Insomnia 10/29/2014  . Plantar fasciitis 12/02/2013  . Benign essential HTN 05/28/2013  . Hepatic steatosis 04/12/2011  . Back pain 06/16/2010  . HEMORRHOIDS, EXTERNAL 12/21/2009  . DEPRESSION, MAJOR, MODERATE 04/04/2007  . HAIR LOSS 08/23/2006  . OBESITY NOS 05/28/2006  . FATIGUE 05/28/2006  . GLUCOSE INTOLERANCE  05/23/2006  . Migraine 05/23/2006    NAUMANN-HOUEGNIFIO,Vernona Peake PTA 07/28/2015, 11:15 AM  [facility] [address], [address] [place], [place], [address] Phone: [phone]   Fax:  [phone]  Name: Hailey Hopkins MRN: [id number] Date of Birth: Oct 17, 1975

## 2015-08-02 ENCOUNTER — Encounter: Payer: Self-pay | Admitting: Physical Therapy

## 2015-08-02 ENCOUNTER — Ambulatory Visit: Payer: PRIVATE HEALTH INSURANCE | Admitting: Physical Therapy

## 2015-08-02 DIAGNOSIS — M25512 Pain in left shoulder: Secondary | ICD-10-CM

## 2015-08-02 DIAGNOSIS — R293 Abnormal posture: Secondary | ICD-10-CM

## 2015-08-02 DIAGNOSIS — M25612 Stiffness of left shoulder, not elsewhere classified: Secondary | ICD-10-CM | POA: Diagnosis not present

## 2015-08-02 DIAGNOSIS — M6281 Muscle weakness (generalized): Secondary | ICD-10-CM

## 2015-08-02 NOTE — Therapy (Signed)
[facility] [address] [place], [place], [address] Phone: [phone]   Fax:  [phone]  Physical Therapy Treatment  Patient Details  Name: Hailey Hopkins MRN: [id number] Date of Birth: November 04, 1975 Referring Provider: Jones Broom, MD  Encounter Date: 08/02/2015      PT End of Session - 08/02/15 1032    Visit Number 14   Date for PT Re-Evaluation 08/10/15   Authorization Type Workers Comp   Authorization - Visit Number 14   Authorization - Number of Visits 29   PT Start Time 1015   PT Stop Time 1118   PT Time Calculation (min) 63 min   Activity Tolerance Patient tolerated treatment well   Behavior During Therapy [facility] for tasks assessed/performed      Past Medical History  Diagnosis Date  . Diverticular disease   . Depression     Past Surgical History  Procedure Laterality Date  . Abdominal hysterectomy    . Rotator cuff repair      There were no vitals filed for this visit.      Subjective Assessment - 08/02/15 1029    Subjective Lt pectoralis is sore, however the rango of motion in left shoulder is better as noticed with putting hair up.   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  Lt shoulder surgery 12/14/2014 for partial rotator cuff repair and distal clavicle excision. March 15th injection into left shoulder under Ultrasound to break up scar tissue   Diagnostic tests injection: Cortizone 04/2015   Patient Stated Goals reduce pain, return to normal use of Lt UE   Currently in Pain? Yes   Pain Score 4    Pain Location Shoulder   Pain Orientation Left   Pain Descriptors / Indicators Sore   Pain Type Chronic pain   Pain Onset More than a month ago   Pain Frequency Constant   Aggravating Factors  overdoing it with exercises, moving the Lt shoulder   Pain Relieving Factors ice, medication, heat   Multiple Pain Sites No            [facility] PT Assessment - 08/02/15 0001     ROM / Strength   AROM / PROM / Strength AROM   AROM   Right/Left Shoulder Left   Left Shoulder Extension 50 Degrees   Left Shoulder Flexion 137 Degrees   Left Shoulder ABduction 135 Degrees   Left Shoulder Internal Rotation --  lacking 3 inches compare to Rt side   Left Shoulder External Rotation --  reaching T2 less discomfort than in the past                     [facility] Adult PT Treatment/Exercise - 08/02/15 0001    Posture/Postural Control   Posture/Postural Control Postural limitations   Postural Limitations Rounded Shoulders;Forward head   Exercises   Exercises Shoulder   Shoulder Exercises: Supine   Other Supine Exercises supine on foam roll for pec stretch x 3 minutes.Then overhead flexion 1# added, and snow angels 2x10 each  ROM with snowangel is improving   Shoulder Exercises: Standing   External Rotation AAROM  with cane x 10   Internal Rotation AAROM  with cane x 10   Other Standing Exercises Wall clock x 4 4 reps b/w activities challenging but quality of movement imprving    Other Standing Exercises lat stretch at wall UE in 12 o' clock position   Shoulder Exercises: Pulleys   Other  Pulley Exercises IR  in standing   Shoulder Exercises: ROM/Strengthening   UBE (Upper Arm Bike) Level 2 x 6 minutes (3/3)   Moist Heat Therapy   Number Minutes Moist Heat 20 Minutes   Moist Heat Location Shoulder   Electrical Stimulation   Electrical Stimulation Location Lt pec/shoulder   Electrical Stimulation Action IFC   Electrical Stimulation Parameters 20   Electrical Stimulation Goals Pain   Manual Therapy   Manual Therapy Soft tissue mobilization   Soft tissue mobilization pectoralis and deltoid and ant capsule, with PROM in supine                  PT Short Term Goals - 07/19/15 1022    PT SHORT TERM GOAL #1   Title She will be independent with inital HEP    Status Achieved           PT Long Term Goals - 07/28/15 1039    PT LONG  TERM GOAL #1   Title be independent in advanced HEP   Time 8   Period Weeks   Status On-going   PT LONG TERM GOAL #2   Title improve FOTO to < or = to 46% limitation   Time 8   Period Weeks   Status On-going   PT LONG TERM GOAL #3   Title report a 50% reduction in Lt shoulder pain with work and home tasks  30-40% as of Jul 28, 2015   Baseline more painful in the last week   Time 8   Period Weeks   Status On-going   PT LONG TERM GOAL #4   Title demonstrate 4+/5 Lt shoulder strength throughout to improve endurance for Lt UE use   Baseline Pain with endrange left shoulder flexion   Time 6   Period Weeks   Status On-going   PT LONG TERM GOAL #5   Title able to lift to waist and carry 10 pounds or more to increase home and work participation as MD allows   Baseline able to do in clinic    Time 8   Period Weeks   Status On-going               Plan - 08/02/15 1035    Clinical Impression Statement Pt with improved AROM in left shoulder. AROM left shoulder in degrees flexion 137, abduction 135, extension 50. Pt will continue to benefit from skilled PT to increase ROM shoulder, release muscle tension pectoralis    Rehab Potential Good   Clinical Impairments Affecting Rehab Potential 11/2014 Lt shoulder surgery of partail tear and distal clavicle excision   PT Frequency 2x / week   PT Duration 8 weeks   PT Treatment/Interventions ADLs/Self Care Home Management;Cryotherapy;Electrical Stimulation;Iontophoresis 4mg /ml Dexamethasone;Moist Heat;Therapeutic exercise;Therapeutic activities;Functional mobility training;Ultrasound;Neuromuscular re-education;Patient/family education;Manual techniques;Taping;Dry needling;Passive range of motion   PT Next Visit Plan Dry needling if helpful, flexibility, strength, modalities and manual PRN   Consulted and Agree with Plan of Care Patient      Patient will benefit from skilled therapeutic intervention in order to improve the following deficits  and impairments:  Postural dysfunction, Decreased strength, Impaired flexibility, Pain, Impaired UE functional use, Decreased activity tolerance, Decreased range of motion  Visit Diagnosis: Shoulder stiffness, left  Abnormal posture  Muscle weakness (generalized)  Pain in left shoulder     Problem List Patient Active Problem List   Diagnosis Date Noted  . Insomnia 10/29/2014  . Plantar fasciitis 12/02/2013  . Benign essential HTN  05/28/2013  . Hepatic steatosis 04/12/2011  . Back pain 06/16/2010  . HEMORRHOIDS, EXTERNAL 12/21/2009  . DEPRESSION, MAJOR, MODERATE 04/04/2007  . HAIR LOSS 08/23/2006  . OBESITY NOS 05/28/2006  . FATIGUE 05/28/2006  . GLUCOSE INTOLERANCE 05/23/2006  . Migraine 05/23/2006    NAUMANN-HOUEGNIFIO,Andreka Stucki PTA 08/02/2015, 11:13 AM  [facility] [address], [address] [place], [place], [address] Phone: [phone]   Fax:  [phone]  Name: Hailey Hopkins MRN: [id number] Date of Birth: [birth year]

## 2015-08-08 ENCOUNTER — Encounter: Payer: Self-pay | Admitting: Physical Therapy

## 2015-08-08 ENCOUNTER — Ambulatory Visit: Payer: PRIVATE HEALTH INSURANCE | Admitting: Physical Therapy

## 2015-08-08 DIAGNOSIS — R293 Abnormal posture: Secondary | ICD-10-CM

## 2015-08-08 DIAGNOSIS — M6281 Muscle weakness (generalized): Secondary | ICD-10-CM

## 2015-08-08 DIAGNOSIS — M25612 Stiffness of left shoulder, not elsewhere classified: Secondary | ICD-10-CM | POA: Diagnosis not present

## 2015-08-08 DIAGNOSIS — M25512 Pain in left shoulder: Secondary | ICD-10-CM

## 2015-08-08 NOTE — Therapy (Signed)
[facility] [address], [address] [place], [place], [address] Phone: [phone]   Fax:  [phone]  Physical Therapy Treatment  Patient Details  Name: Hailey Hopkins MRN: [id number] Date of Birth: 1975-08-11 Referring Provider: Jones Broomhandler, Justin, MD  Encounter Date: 08/08/2015      PT End of Session - 08/08/15 1246    Visit Number 15   Date for PT Re-Evaluation 08/10/15   Authorization Type Workers Comp   Authorization - Visit Number 15   Authorization - Number of Visits 29   PT Start Time 1234   PT Stop Time 1328   PT Time Calculation (min) 54 min   Activity Tolerance Patient tolerated treatment well   Behavior During Therapy [facility] for tasks assessed/performed      Past Medical History  Diagnosis Date  . Diverticular disease   . Depression     Past Surgical History  Procedure Laterality Date  . Abdominal hysterectomy    . Rotator cuff repair      There were no vitals filed for this visit.      Subjective Assessment - 08/08/15 1242    Subjective usually no complain of pain, but with reaching for a platte in the [place] or to the side pain up to 4-5/10 in left shoulder. Pt saw Dr. Thomasena Edisollins last Friday he suggest 4more weeks of PT and he will perform a second injection into left shoulder joint for scartissue release.    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  Lt shoulder surgery 12/14/2014 for partial rotator cuff repair and distal clavicle excision. March 15th injection into left shoulder under Ultrasound to break up scar tissue   Diagnostic tests injection: Cortizone 04/2015   Patient Stated Goals reduce pain, return to normal use of Lt UE   Currently in Pain? Yes   Pain Score 4    Pain Location Shoulder   Pain Orientation Left   Pain Descriptors / Indicators Sore   Pain Type Chronic pain   Pain Onset More than a month ago   Pain Frequency Constant   Aggravating Factors  overdoing it  with exercises, moving the Lt shoulder above 90 flexion and into abduction   Pain Relieving Factors ice, medication, heat   Multiple Pain Sites No                         [facility] Adult PT Treatment/Exercise - 08/08/15 0001    Posture/Postural Control   Posture/Postural Control Postural limitations   Postural Limitations Rounded Shoulders;Forward head   Exercises   Exercises Shoulder   Shoulder Exercises: Supine   Other Supine Exercises supine on foam roll for pec stretch x 3 minutes.Then overhead flexion 1# added, and snow angels 2x10 each   Shoulder Exercises: Standing   External Rotation AAROM  with cane x 10   Internal Rotation AAROM  with cane x 10   Other Standing Exercises Wall clock x 4 4 reps b/w activities challenging but quality of movement imprving    Other Standing Exercises lat stretch at wall UE in 12 o' clock position   Shoulder Exercises: Pulleys   Other Pulley Exercises 2min IR  in standing   Shoulder Exercises: ROM/Strengthening   UBE (Upper Arm Bike) Level 2 x 6 minutes (3/3)   Moist Heat Therapy   Number Minutes Moist Heat 20 Minutes   Moist Heat Location Shoulder   Electrical Stimulation   Electrical Stimulation Location Lt pec/shoulder  Electrical Stimulation Action IFC   Electrical Stimulation Parameters 20   Electrical Stimulation Goals Pain   Manual Therapy   Manual Therapy Soft tissue mobilization   Soft tissue mobilization pectoralis and deltoid and ant capsule, with PROM in supine                  PT Short Term Goals - 07/19/15 1022    PT SHORT TERM GOAL #1   Title She will be independent with inital HEP    Status Achieved           PT Long Term Goals - 08/08/15 1254    PT LONG TERM GOAL #1   Title be independent in advanced HEP   Time 8   Period Weeks   Status On-going   PT LONG TERM GOAL #2   Title improve FOTO to < or = to 46% limitation   Time 8   Period Weeks   Status On-going   PT LONG TERM GOAL #3    Title report a 50% reduction in Lt shoulder pain with work and home tasks   Baseline more painful in the last week   Time 8   Period Weeks   Status On-going   PT LONG TERM GOAL #4   Title demonstrate 4+/5 Lt shoulder strength throughout to improve endurance for Lt UE use   Baseline Pain with endrange left shoulder flexion   Time 8   Period Weeks   Status On-going   PT LONG TERM GOAL #5   Title able to lift to waist and carry 10 pounds or more to increase home and work participation as MD allows   Time 8   Period Weeks   Status On-going               Plan - 08/08/15 1250    Clinical Impression Statement Pt fatique due to prolonged driving this weekend and emotionally drained due to traveling to a funeral. Pt will have 2nd injection for stretching of scar tissue to help release tension and reduce pain, with arm movements. Pt will continue to benefit from skilled PT to increase ROM , strength and endurance.    Rehab Potential Good   Clinical Impairments Affecting Rehab Potential 11/2014 Lt shoulder surgery of partail tear and distal clavicle excision   PT Frequency 2x / week   PT Duration 8 weeks   PT Treatment/Interventions ADLs/Self Care Home Management;Cryotherapy;Electrical Stimulation;Iontophoresis /ml Dexamethasone;Moist Heat;Therapeutic exercise;Therapeutic activities;Functional mobility training;Ultrasound;Neuromuscular re-education;Patient/family education;Manual techniques;Taping;Dry needling;Passive range of motion   PT Next Visit Plan Dry needling if helpful, flexibility, strength, modalities and manual PRN   Consulted and Agree with Plan of Care Patient      Patient will benefit from skilled therapeutic intervention in order to improve the following deficits and impairments:  Postural dysfunction, Decreased strength, Impaired flexibility, Pain, Impaired UE functional use, Decreased activity tolerance, Decreased range of motion  Visit Diagnosis: Shoulder  stiffness, left  Abnormal posture  Muscle weakness (generalized)  Pain in left shoulder     Problem List Patient Active Problem List   Diagnosis Date Noted  . Insomnia 10/29/2014  . Plantar fasciitis 12/02/2013  . Benign essential HTN 05/28/2013  . Hepatic steatosis 04/12/2011  . Back pain 06/16/2010  . HEMORRHOIDS, EXTERNAL 12/21/2009  . DEPRESSION, MAJOR, MODERATE 04/04/2007  . HAIR LOSS 08/23/2006  . OBESITY NOS 05/28/2006  . FATIGUE 05/28/2006  . GLUCOSE INTOLERANCE 05/23/2006  . Migraine 05/23/2006    Hopkins,Hailey [place] PTA 08/08/2015, 5:26 PM  [facility] [address] [place], [place], [address] Phone: [phone]   Fax:  [phone]  Name: Hailey Hopkins MRN: [id number] Date of Birth: [birth year]

## 2015-08-10 ENCOUNTER — Ambulatory Visit: Payer: PRIVATE HEALTH INSURANCE

## 2015-08-10 DIAGNOSIS — M25612 Stiffness of left shoulder, not elsewhere classified: Secondary | ICD-10-CM | POA: Diagnosis not present

## 2015-08-10 DIAGNOSIS — M6281 Muscle weakness (generalized): Secondary | ICD-10-CM

## 2015-08-10 DIAGNOSIS — R293 Abnormal posture: Secondary | ICD-10-CM

## 2015-08-10 DIAGNOSIS — M25512 Pain in left shoulder: Secondary | ICD-10-CM

## 2015-08-10 NOTE — Therapy (Signed)
[facility] [address], [address] [place], [place], [address] Phone: [phone]   Fax:  [phone]  Physical Therapy Treatment  Patient Details  Name: Hailey Hopkins MRN: [id number] Date of Birth: September 16, 1975 Referring Provider: Valma Cavaollins, Andrew, MD  Encounter Date: 08/10/2015      PT End of Session - 08/10/15 1218    Visit Number 16   Date for PT Re-Evaluation 09/21/15   Authorization Type Workers Comp   Authorization - Visit Number 16   Authorization - Number of Visits 20   PT Start Time 1146   PT Stop Time 1243   PT Time Calculation (min) 57 min   Activity Tolerance Patient tolerated treatment well   Behavior During Therapy [facility] for tasks assessed/performed      Past Medical History  Diagnosis Date  . Diverticular disease   . Depression     Past Surgical History  Procedure Laterality Date  . Abdominal hysterectomy    . Rotator cuff repair      There were no vitals filed for this visit.      Subjective Assessment - 08/10/15 1156    Subjective Pt will have procedure soon for injection/"joint stretching"   Patient Stated Goals reduce pain, return to normal use of Lt UE   Currently in Pain? Yes   Pain Score 3    Pain Location Shoulder   Pain Orientation Left   Pain Descriptors / Indicators Sore   Pain Type Chronic pain   Pain Onset More than a month ago   Pain Frequency Constant   Aggravating Factors  overdoing it with exercise, moving the Lt shoulder overhead   Pain Relieving Factors ice, meication, heat            [facility] PT Assessment - 08/10/15 0001    Assessment   Medical Diagnosis post-op stiffness of Lt shoulder, Lt pectoralis strain, distal clavicle excision, repair of partial tear of rotator cuff and bone spur   Referring Provider Valma Cavaollins, Andrew, MD   Onset Date/Surgical Date 12/14/14   Hand Dominance Right   Cognition   Overall Cognitive Status Within Functional Limits for tasks  assessed   Observation/Other Assessments   Focus on Therapeutic Outcomes (FOTO)  39% limitation   ROM / Strength   AROM / PROM / Strength AROM   AROM   Right/Left Shoulder Left   Left Shoulder Flexion 139 Degrees   Left Shoulder ABduction 118 Degrees   Left Shoulder Internal Rotation --  lacking 4 inches vs Rt   Left Shoulder External Rotation --  to T2   Strength   Overall Strength Deficits   Strength Assessment Site Shoulder   Right/Left Shoulder Left   Left Shoulder Flexion 4+/5   Left Shoulder ABduction 4+/5   Left Shoulder Internal Rotation 5/5   Left Shoulder External Rotation 4+/5                     [facility] Adult PT Treatment/Exercise - 08/10/15 0001    Shoulder Exercises: Supine   Other Supine Exercises supine on foam roll for pec stretch x 3 minutes.Then overhead flexion 1# added, and snow angels 2x10 each   Shoulder Exercises: Seated   Flexion Strengthening;Left;Weights;15 reps   Flexion Weight (lbs) 1   Abduction Strengthening;Left;15 reps  also scaption 1# 1x10 then 1x5   ABduction Weight (lbs) 1   Shoulder Exercises: Standing   Other Standing Exercises Wall clock x 4 4 reps b/w activities challenging but  quality of movement imprving    Shoulder Exercises: Pulleys   Other Pulley Exercises IR  in standing   Shoulder Exercises: ROM/Strengthening   UBE (Upper Arm Bike) Level 2 x 8 minutes (4/4)   Moist Heat Therapy   Number Minutes Moist Heat 15 Minutes   Moist Heat Location Shoulder   Electrical Stimulation   Electrical Stimulation Location Lt pec/shoulder   Electrical Stimulation Action IFC   Electrical Stimulation Parameters 15   Electrical Stimulation Goals Pain                  PT Short Term Goals - 07/19/15 1022    PT SHORT TERM GOAL #1   Title She will be independent with inital HEP    Status Achieved           PT Long Term Goals - 08/10/15 1157    PT LONG TERM GOAL #1   Title be independent in advanced HEP    Time 6   Period Weeks   Status On-going   PT LONG TERM GOAL #2   Title improve FOTO to < or = to 46% limitation   Time 6   Period Weeks   Status On-going  39% limitaiton   PT LONG TERM GOAL #3   Title report a 50% reduction in Lt shoulder pain with home tasks   Time 6   Period Weeks   Status On-going  40%    PT LONG TERM GOAL #4   Title demonstrate 4+/5 Lt shoulder strength throughout to improve endurance for Lt UE use   Status Achieved   PT LONG TERM GOAL #5   Title able to lift to waist and carry 10 pounds or more to increase home and work participation as MD allows   Time 6   Period Weeks   Status On-going   Additional Long Term Goals   Additional Long Term Goals Yes   PT LONG TERM GOAL #6   Title demonstrate Lt shoulder IR to lacking < or = to 2 inches vs the Rt to improve self-care/dressing   Time 6   Period Weeks   Status On-going               Plan - 08/10/15 1201    Clinical Impression Statement Pt reports 50% overall improvement in Lt shoulder symptoms since the start of care.  Pain is 40% better.  Pt with improved strength in the Lt shoulder.  FOTO is improved to 39% limitation (46% at evaluation).  Pt with continued limitation in Lt shoulder AROM, pain with use and not able to lift and reach overhead as needed for work tasks (out of work right now).  Pt will continue to benefit from skilled PT for Lt shoulder strength, ROM, manual and modalities to allow for return to work and home tasks without limitation.     Rehab Potential Good   Clinical Impairments Affecting Rehab Potential 11/2014 Lt shoulder surgery of partail tear and distal clavicle excision   PT Frequency 2x / week   PT Duration 6 weeks   PT Treatment/Interventions ADLs/Self Care Home Management;Cryotherapy;Electrical Stimulation;Iontophoresis 4mg /ml Dexamethasone;Moist Heat;Therapeutic exercise;Therapeutic activities;Functional mobility training;Ultrasound;Neuromuscular re-education;Patient/family  education;Manual techniques;Taping;Dry needling;Passive range of motion   PT Next Visit Plan Lt shoulder flexibility, strength, modalities and manual PRN   Consulted and Agree with Plan of Care Patient      Patient will benefit from skilled therapeutic intervention in order to improve the following deficits and impairments:  Postural dysfunction,  Decreased strength, Impaired flexibility, Pain, Impaired UE functional use, Decreased activity tolerance, Decreased range of motion  Visit Diagnosis: Shoulder stiffness, left - Plan: PT plan of care cert/re-cert  Abnormal posture - Plan: PT plan of care cert/re-cert  Muscle weakness (generalized) - Plan: PT plan of care cert/re-cert  Pain in left shoulder - Plan: PT plan of care cert/re-cert     Problem List Patient Active Problem List   Diagnosis Date Noted  . Insomnia 10/29/2014  . Plantar fasciitis 12/02/2013  . Benign essential HTN 05/28/2013  . Hepatic steatosis 04/12/2011  . Back pain 06/16/2010  . HEMORRHOIDS, EXTERNAL 12/21/2009  . DEPRESSION, MAJOR, MODERATE 04/04/2007  . HAIR LOSS 08/23/2006  . OBESITY NOS 05/28/2006  . FATIGUE 05/28/2006  . GLUCOSE INTOLERANCE 05/23/2006  . Migraine 05/23/2006     [name], PT 08/10/2015 12:20 PM  [facility] [address] [place], [place], [address] Phone: [phone]   Fax:  [phone]  Name: WRIGLEY WINBORNE MRN: [id number] Date of Birth: 03-24-1976

## 2015-08-15 ENCOUNTER — Ambulatory Visit: Payer: PRIVATE HEALTH INSURANCE | Admitting: Physical Therapy

## 2015-08-17 ENCOUNTER — Ambulatory Visit: Payer: PRIVATE HEALTH INSURANCE | Admitting: Physical Therapy

## 2015-08-23 ENCOUNTER — Ambulatory Visit: Payer: PRIVATE HEALTH INSURANCE

## 2015-08-23 DIAGNOSIS — M6281 Muscle weakness (generalized): Secondary | ICD-10-CM

## 2015-08-23 DIAGNOSIS — M25612 Stiffness of left shoulder, not elsewhere classified: Secondary | ICD-10-CM

## 2015-08-23 DIAGNOSIS — R293 Abnormal posture: Secondary | ICD-10-CM

## 2015-08-23 DIAGNOSIS — M25512 Pain in left shoulder: Secondary | ICD-10-CM

## 2015-08-23 NOTE — Therapy (Addendum)
[facility] [address], [address] [place], [place], [address] Phone: [phone]   Fax:  [phone]  Physical Therapy Treatment  Patient Details  Name: Hailey Hopkins MRN: [id number] Date of Birth: Apr 01, 1975 Referring Provider: Valma Cavaollins, Andrew, MD  Encounter Date: 08/23/2015      PT End of Session - 08/23/15 1135    Visit Number 17   Date for PT Re-Evaluation 09/21/15   Authorization Type Workers Comp   Authorization - Visit Number 17   Authorization - Number of Visits 32   PT Start Time 1100   PT Stop Time 1159   PT Time Calculation (min) 59 min   Activity Tolerance Patient tolerated treatment well   Behavior During Therapy [facility] for tasks assessed/performed      Past Medical History  Diagnosis Date  . Diverticular disease   . Depression     Past Surgical History  Procedure Laterality Date  . Abdominal hysterectomy    . Rotator cuff repair      There were no vitals filed for this visit.      Subjective Assessment - 08/23/15 1059    Subjective Pt has not had the procedure for injection/joint stretching.  Waiting to have it scheduled.  Passed 2 kidney stones last week.     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  Lt shoulder surgery 12/14/2014 for partial rotator cuff repair and distal clavicle excision. March 15th injection into left shoulder under Ultrasound to break up scar tissue   Patient Stated Goals reduce pain, return to normal use of Lt UE   Currently in Pain? Yes   Pain Score 3    Pain Location Shoulder   Pain Descriptors / Indicators Sore   Pain Type Chronic pain   Pain Onset More than a month ago   Pain Frequency Constant   Aggravating Factors  overdoing it with exercise, moving the Lt shoulder overhead   Pain Relieving Factors ice, medication, heat                         [facility] Adult PT Treatment/Exercise - 08/23/15 0001    Shoulder Exercises: Supine    Other Supine Exercises supine on foam roll for pec stretch x 3 minutes.Then overhead flexion 1# added, and snow angels 2x10 each   Shoulder Exercises: Seated   Flexion Strengthening;Left;Weights;15 reps   Flexion Weight (lbs) 1   Abduction Strengthening;Left;20 reps;Weights  also scaption 1# 2x10   ABduction Weight (lbs) 1   Shoulder Exercises: Standing   Other Standing Exercises Wall clock x 4 4 reps b/w activities challenging but quality of movement imprving    Other Standing Exercises wall push-ups 2x10   Shoulder Exercises: Pulleys   Other Pulley Exercises 2min IR  in standing   Shoulder Exercises: ROM/Strengthening   UBE (Upper Arm Bike) Level 2 x 8 minutes (4/4)   Wall Pushups 20 reps   Moist Heat Therapy   Number Minutes Moist Heat 15 Minutes   Moist Heat Location Shoulder   Electrical Stimulation   Electrical Stimulation Location Lt pec/shoulder   Electrical Stimulation Action IFC   Electrical Stimulation Parameters 15   Electrical Stimulation Goals Pain                  PT Short Term Goals - 07/19/15 1022    PT SHORT TERM GOAL #1   Title She will be independent with inital HEP    Status Achieved  PT Long Term Goals - 08/23/15 1104    PT LONG TERM GOAL #1   Title be independent in advanced HEP   Time 6   Period Weeks   Status On-going   PT LONG TERM GOAL #2   Title improve FOTO to < or = to 46% limitation   Time 6   Period Weeks   Status Achieved   PT LONG TERM GOAL #3   Title report a 50% reduction in Lt shoulder pain with home tasks   Time 6   Period Weeks   Status On-going   PT LONG TERM GOAL #4   Title demonstrate 4+/5 Lt shoulder strength throughout to improve endurance for Lt UE use   Status Achieved   PT LONG TERM GOAL #5   Title able to lift to waist and carry 10 pounds or more to increase home and work participation as MD allows   Time 6   Period Weeks   Status On-going   PT LONG TERM GOAL #6   Title demonstrate Lt  shoulder IR to lacking < or = to 2 inches vs the Rt to improve self-care/dressing   Time 6   Period Weeks   Status On-going               Plan - 08/23/15 1105    Clinical Impression Statement Pt reports 50% overall improvement in Lt shoulder symptoms since the start of care.  Pt with improved strength as measuered last visit.  FOTO is improved to 39% limitation.  Pt missed treatment last week due to kidney stones.  Pt with continued limitation in Lt shoulder AROM , pain with use and not able to reach overhead needed for work tasks (out of work right now).  Pt will continue to benefit form skilled PT for Lt shoulder strength, ROM, manual and modaliteis to allow for return to work and home tasks without limitation.     Rehab Potential Good   Clinical Impairments Affecting Rehab Potential 11/2014 Lt shoulder surgery of partail tear and distal clavicle excision   PT Frequency 2x / week   PT Duration 6 weeks   PT Treatment/Interventions ADLs/Self Care Home Management;Cryotherapy;Electrical Stimulation;Iontophoresis /ml Dexamethasone;Moist Heat;Therapeutic exercise;Therapeutic activities;Functional mobility training;Ultrasound;Neuromuscular re-education;Patient/family education;Manual techniques;Taping;Dry needling;Passive range of motion   PT Next Visit Plan Lt shoulder flexibility, strength, modalities and manual PRN   Consulted and Agree with Plan of Care Patient      Patient will benefit from skilled therapeutic intervention in order to improve the following deficits and impairments:  Postural dysfunction, Decreased strength, Impaired flexibility, Pain, Impaired UE functional use, Decreased activity tolerance, Decreased range of motion  Visit Diagnosis: Shoulder stiffness, left  Abnormal posture  Muscle weakness (generalized)  Pain in left shoulder     Problem List Patient Active Problem List   Diagnosis Date Noted  . Insomnia 10/29/2014  . Plantar fasciitis 12/02/2013  .  Benign essential HTN 05/28/2013  . Hepatic steatosis 04/12/2011  . Back pain 06/16/2010  . HEMORRHOIDS, EXTERNAL 12/21/2009  . DEPRESSION, MAJOR, MODERATE 04/04/2007  . HAIR LOSS 08/23/2006  . OBESITY NOS 05/28/2006  . FATIGUE 05/28/2006  . GLUCOSE INTOLERANCE 05/23/2006  . Migraine 05/23/2006     [name], PT 08/23/2015 11:44 AM  [facility] [address], [address] [place], [place], [address] Phone: [phone]   Fax:  [phone]  Name: Hailey Hopkins MRN: [id number] Date of Birth: 10-17-1975

## 2015-08-29 ENCOUNTER — Ambulatory Visit: Payer: PRIVATE HEALTH INSURANCE | Attending: Surgical | Admitting: Physical Therapy

## 2015-08-29 ENCOUNTER — Encounter: Payer: Self-pay | Admitting: Physical Therapy

## 2015-08-29 DIAGNOSIS — M25612 Stiffness of left shoulder, not elsewhere classified: Secondary | ICD-10-CM

## 2015-08-29 DIAGNOSIS — M6281 Muscle weakness (generalized): Secondary | ICD-10-CM | POA: Diagnosis present

## 2015-08-29 DIAGNOSIS — R293 Abnormal posture: Secondary | ICD-10-CM | POA: Diagnosis present

## 2015-08-29 DIAGNOSIS — M25512 Pain in left shoulder: Secondary | ICD-10-CM | POA: Diagnosis present

## 2015-08-29 NOTE — Therapy (Signed)
[facility] [address], [address] [place], [place], [address] Phone: [phone]   Fax:  [phone]  Physical Therapy Treatment  Patient Details  Name: Hailey Hopkins MRN: [id number] Date of Birth: 03-Apr-1975 Referring Provider: Valma Cavaollins, Andrew, MD  Encounter Date: 08/29/2015      PT End of Session - 08/29/15 1237    Visit Number 18   Date for PT Re-Evaluation 09/21/15   Authorization Type Workers Comp   Authorization - Visit Number 18   Authorization - Number of Visits 32   PT Start Time 1233   PT Stop Time 1329   PT Time Calculation (min) 56 min   Activity Tolerance Patient tolerated treatment well   Behavior During Therapy [facility] for tasks assessed/performed      Past Medical History  Diagnosis Date  . Diverticular disease   . Depression     Past Surgical History  Procedure Laterality Date  . Abdominal hysterectomy    . Rotator cuff repair      There were no vitals filed for this visit.      Subjective Assessment - 08/29/15 1236    Subjective Pt has procedure for injection/joint stretching.     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  Lt shoulder surgery 12/14/2014 for partial rotator cuff repair and distal clavicle excision. March 15th injection into left shoulder under Ultrasound to break up scar tissue   Diagnostic tests injection: Cortizone 04/2015   Patient Stated Goals reduce pain, return to normal use of Lt UE            [facility] PT Assessment - 08/29/15 0001    ROM / Strength   AROM / PROM / Strength AROM   AROM   Right/Left Shoulder Left   Left Shoulder Flexion 145 Degrees   Left Shoulder ABduction 118 Degrees   Left Shoulder Internal Rotation --  lacking 2 inches compared to Rt side   Left Shoulder External Rotation --  to T2   Strength   Overall Strength Deficits   Strength Assessment Site Shoulder   Right/Left Shoulder Left   Left Shoulder Flexion 4+/5   Left  Shoulder ABduction 4+/5   Left Shoulder Internal Rotation 5/5   Left Shoulder External Rotation 4+/5                     [facility] Adult PT Treatment/Exercise - 08/29/15 0001    Exercises   Exercises Shoulder   Shoulder Exercises: Supine   Other Supine Exercises supine on foam roll for pec stretch x 3 minutes.Then overhead flexion 2# added, and snow angels 2x10 each   Shoulder Exercises: Seated   Flexion Strengthening;Left;Weights;15 reps   Flexion Weight (lbs) 1   Abduction Strengthening;Left;20 reps;Weights  also scaption x 20   ABduction Weight (lbs) 1   Shoulder Exercises: Standing   Other Standing Exercises Wall clock 2x10 quallity of movement imprving    Other Standing Exercises wall push-ups 2x10   Shoulder Exercises: Pulleys   Other Pulley Exercises 2min IR  instanding   Shoulder Exercises: ROM/Strengthening   UBE (Upper Arm Bike) Level 2 x 8 minutes (4/4)   Wall Pushups 20 reps   Moist Heat Therapy   Number Minutes Moist Heat 15 Minutes   Moist Heat Location Shoulder   Electrical Stimulation   Electrical Stimulation Location Lt pec/shoulder   Electrical Stimulation Action IFC   Electrical Stimulation Parameters 15   Electrical Stimulation Goals Pain  PT Short Term Goals - 08/29/15 1246    PT SHORT TERM GOAL #1   Title She will be independent with inital HEP    Baseline can reproduce correctly without cues.   Time 4   Period Weeks   Status Achieved   PT SHORT TERM GOAL #2   Title report a 25% reduction in Lt shoulder pain with use at home and work   Baseline AA arm to fix hair   Time 4   Period Weeks   Status Achieved   PT SHORT TERM GOAL #3   Title demonstrate Lt shoulder AROM IR to L2 at midline to improve use with self-care   Baseline understands and uses RICE at home   Time 4   Period Weeks   Status Achieved           PT Long Term Goals - 08/29/15 1249    PT LONG TERM GOAL #1   Title be independent in  advanced HEP   Time 6   Period Weeks   Status On-going   PT LONG TERM GOAL #2   Title improve FOTO to < or = to 46% limitation   Baseline 110 today   Time 6   Period Weeks   Status Achieved   PT LONG TERM GOAL #3   Title report a 50% reduction in Lt shoulder pain with home tasks   Baseline more painful in the last week   Time 6   Period Weeks   Status On-going   PT LONG TERM GOAL #4   Title demonstrate 4+/5 Lt shoulder strength throughout to improve endurance for Lt UE use   Baseline Pain with endrange left shoulder flexion   Time 8   Period Weeks   Status Achieved   PT LONG TERM GOAL #5   Title able to lift to waist and carry 10 pounds or more to increase home and work participation as MD allows   Baseline able to do in clinic    Time 6   Period Weeks   Status On-going   PT LONG TERM GOAL #6   Title demonstrate Lt shoulder IR to lacking < or = to 2 inches vs the Rt to improve self-care/dressing   Time 6   Period Weeks   Status On-going               Plan - 08/29/15 1239    Clinical Impression Statement Pt is improving with reaching and strength, however still challenging to close the car door or reach to side or behind back. AROM in degrees left shoulder flexion 145, abduction 118 (pt out of work due to limitations Lt shoulder). Pt will continue to benefit from skilled PT for Lt shoulder strength, ROM, manual and modalities to allow for return to work and home tasks without limitations.     Rehab Potential Good   Clinical Impairments Affecting Rehab Potential 11/2014 Lt shoulder surgery of partail tear and distal clavicle excision   PT Frequency 2x / week   PT Duration 6 weeks   PT Treatment/Interventions ADLs/Self Care Home Management;Cryotherapy;Electrical Stimulation;Iontophoresis 4mg /ml Dexamethasone;Moist Heat;Therapeutic exercise;Therapeutic activities;Functional mobility training;Ultrasound;Neuromuscular re-education;Patient/family education;Manual  techniques;Taping;Dry needling;Passive range of motion   PT Next Visit Plan Re-evaluate after procedure on Lt shoulder (Thursday 8)   Consulted and Agree with Plan of Care Patient      Patient will benefit from skilled therapeutic intervention in order to improve the following deficits and impairments:  Postural dysfunction, Decreased strength, Impaired flexibility, Pain,  Impaired UE functional use, Decreased activity tolerance, Decreased range of motion  Visit Diagnosis: Shoulder stiffness, left  Abnormal posture  Muscle weakness (generalized)  Pain in left shoulder     Problem List Patient Active Problem List   Diagnosis Date Noted  . Insomnia 10/29/2014  . Plantar fasciitis 12/02/2013  . Benign essential HTN 05/28/2013  . Hepatic steatosis 04/12/2011  . Back pain 06/16/2010  . HEMORRHOIDS, EXTERNAL 12/21/2009  . DEPRESSION, MAJOR, MODERATE 04/04/2007  . HAIR LOSS 08/23/2006  . OBESITY NOS 05/28/2006  . FATIGUE 05/28/2006  . GLUCOSE INTOLERANCE 05/23/2006  . Migraine 05/23/2006    NAUMANN-HOUEGNIFIO,Kristie Bracewell PTA 08/29/2015, 1:23 PM  [facility] [address] [place], [place], [address] Phone: [phone]   Fax:  [phone]  Name: Hailey Hopkins MRN: [id number] Date of Birth: [birth year]

## 2015-08-31 ENCOUNTER — Encounter: Payer: Self-pay | Admitting: Physical Therapy

## 2015-09-02 ENCOUNTER — Ambulatory Visit: Payer: PRIVATE HEALTH INSURANCE | Admitting: Physical Therapy

## 2015-09-02 DIAGNOSIS — M6281 Muscle weakness (generalized): Secondary | ICD-10-CM

## 2015-09-02 DIAGNOSIS — M25512 Pain in left shoulder: Secondary | ICD-10-CM

## 2015-09-02 DIAGNOSIS — M25612 Stiffness of left shoulder, not elsewhere classified: Secondary | ICD-10-CM | POA: Diagnosis not present

## 2015-09-02 NOTE — Therapy (Signed)
[facility] [address], [address] [place], [place], [address] Phone: [phone]   Fax:  [phone]  Physical Therapy Treatment  Patient Details  Name: Hailey ButteryFelicia G Hopkins MRN: [id number] Date of Birth: Aug 09, 1975 Referring Provider: Valma Cavaollins, Andrew, MD  Encounter Date: 09/02/2015      PT End of Session - 09/02/15 1008    Visit Number 19   Date for PT Re-Evaluation 09/21/15   Authorization Type Workers Comp   PT Start Time 1000   PT Stop Time 1053   PT Time Calculation (min) 53 min   Activity Tolerance Patient tolerated treatment well      Past Medical History  Diagnosis Date  . Diverticular disease   . Depression     Past Surgical History  Procedure Laterality Date  . Abdominal hysterectomy    . Rotator cuff repair      There were no vitals filed for this visit.      Subjective Assessment - 09/02/15 1004    Subjective Had joint saline injection yesterday to stretch the capsule.  Had this before but this time was more painful and I didn't sleep well last night.   The doctor told her to do gentle stretching yesterday.  This is the most my shoulder has hurt in a long time.     Currently in Pain? Yes   Pain Score 8    Pain Location Shoulder   Pain Orientation Left            [facility] PT Assessment - 09/02/15 0001    AROM   Left Shoulder Extension 45 Degrees   Left Shoulder Flexion 140 Degrees   Left Shoulder ABduction 113 Degrees   Left Shoulder Internal Rotation --  behind back L2   Left Shoulder External Rotation 68 Degrees   Strength   Left Shoulder Flexion 4+/5   Left Shoulder ABduction 4+/5   Left Shoulder Internal Rotation 5/5   Left Shoulder External Rotation 4+/5                     [facility] Adult PT Treatment/Exercise - 09/02/15 0001    Shoulder Exercises: Supine   External Rotation AROM;Left;5 reps  with knees rotation to right   Flexion AAROM;Left;10 reps   Other Supine  Exercises arms in T position with knee lean to the right   Shoulder Exercises: Prone   Extension AROM;Left;10 reps   Horizontal ABduction 1 AROM;Left;10 reps   Other Prone Exercises row 10x   Shoulder Exercises: Sidelying   External Rotation Left;10 reps   Flexion AROM;Left;10 reps   ABduction AROM;Left;10 reps   Shoulder Exercises: Standing   Flexion AAROM;Left;20 reps  red ball on tall table   Shoulder Exercises: Pulleys   Flexion 2 minutes   Other Pulley Exercises 2min IR  instanding                  PT Short Term Goals - 09/02/15 1041    PT SHORT TERM GOAL #1   Title She will be independent with inital HEP    Status Achieved   PT SHORT TERM GOAL #2   Title report a 25% reduction in Lt shoulder pain with use at home and work   Status Achieved   PT SHORT TERM GOAL #3   Title demonstrate Lt shoulder AROM IR to L2 at midline to improve use with self-care   Status Achieved           PT  Long Term Goals - 09/02/15 1041    PT LONG TERM GOAL #1   Title be independent in advanced HEP   Time 6   Period Weeks   Status On-going   PT LONG TERM GOAL #2   Title improve FOTO to < or = to 46% limitation   Status Achieved   PT LONG TERM GOAL #3   Title report a 50% reduction in Lt shoulder pain with home tasks   Time 6   Period Weeks   Status On-going   PT LONG TERM GOAL #4   Title demonstrate 4+/5 Lt shoulder strength throughout to improve endurance for Lt UE use   Status Achieved   PT LONG TERM GOAL #5   Title able to lift to waist and carry 10 pounds or more to increase home and work participation as MD allows   Time 6   Period Weeks   Status On-going   PT LONG TERM GOAL #6   Title demonstrate Lt shoulder IR to lacking < or = to 2 inches vs the Rt to improve self-care/dressing   Time 6   Period Weeks   Status On-going               Plan - 09/02/15 1034    Clinical Impression Statement The patient is very sore/painful today after capsular saline  injection yesterday.  AROM slightly less in all planes compared to last visit.  Treatment focus on gentle ROM.  Therapist closely monitoring pain and modifying as needed.  The patient reports finger swelling post session.  The patient declines modalities opting to apply ice/elevation at home.  No further progress toward goals secondary to inflammation from recent procedure.     PT Next Visit Plan Continue with shoulder progressive ROM, resume strengthening as tolerated;  modalities as needed      Patient will benefit from skilled therapeutic intervention in order to improve the following deficits and impairments:     Visit Diagnosis: Shoulder stiffness, left  Muscle weakness (generalized)  Pain in left shoulder     Problem List Patient Active Problem List   Diagnosis Date Noted  . Insomnia 10/29/2014  . Plantar fasciitis 12/02/2013  . Benign essential HTN 05/28/2013  . Hepatic steatosis 04/12/2011  . Back pain 06/16/2010  . HEMORRHOIDS, EXTERNAL 12/21/2009  . DEPRESSION, MAJOR, MODERATE 04/04/2007  . HAIR LOSS 08/23/2006  . OBESITY NOS 05/28/2006  . FATIGUE 05/28/2006  . GLUCOSE INTOLERANCE 05/23/2006  . Migraine 05/23/2006    Lavinia Sharps, PT 09/02/2015 11:08 AM Phone: [phone] Fax: [phone]  Hailey Hopkins 09/02/2015, 10:53 AM  [facility] [address] [place], [place], [address] Phone: [phone]   Fax:  [phone]  Name: Hailey Hopkins MRN: [id number] Date of Birth: 08-08-1975

## 2015-09-05 ENCOUNTER — Ambulatory Visit: Payer: PRIVATE HEALTH INSURANCE | Admitting: Physical Therapy

## 2015-09-05 ENCOUNTER — Encounter: Payer: Self-pay | Admitting: Physical Therapy

## 2015-09-05 DIAGNOSIS — M6281 Muscle weakness (generalized): Secondary | ICD-10-CM

## 2015-09-05 DIAGNOSIS — M25612 Stiffness of left shoulder, not elsewhere classified: Secondary | ICD-10-CM

## 2015-09-05 DIAGNOSIS — M25512 Pain in left shoulder: Secondary | ICD-10-CM

## 2015-09-05 NOTE — Therapy (Signed)
[facility] [address], [address] [place], [place], [address] Phone: [phone]   Fax:  [phone]  Physical Therapy Treatment  Patient Details  Name: Hailey ButteryFelicia G Ricke MRN: [id number] Date of Birth: 06/07/1975 Referring Provider: Valma Cavaollins, Andrew, MD  Encounter Date: 09/05/2015      PT End of Session - 09/05/15 1410    Visit Number 20   Number of Visits 32   Date for PT Re-Evaluation 09/21/15   Authorization Type Workers Comp   Authorization - Visit Number 19   Authorization - Number of Visits 32   PT Start Time 1402   PT Stop Time 1446   PT Time Calculation (min) 44 min   Activity Tolerance Patient tolerated treatment well   Behavior During Therapy [facility] for tasks assessed/performed      Past Medical History  Diagnosis Date  . Diverticular disease   . Depression     Past Surgical History  Procedure Laterality Date  . Abdominal hysterectomy    . Rotator cuff repair      There were no vitals filed for this visit.      Subjective Assessment - 09/05/15 1408    Subjective Pain in Lt shoulder rated as a 3/10.    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  Lt shoulder surgery 12/14/2014 for partial rotator cuff repair and distal clavicle excision. March 15th injection into left shoulder under Ultrasound to break up scar tissue   Diagnostic tests injection: Cortizone 04/2015   Patient Stated Goals reduce pain, return to normal use of Lt UE   Currently in Pain? Yes   Pain Score 3    Pain Location Shoulder   Pain Orientation Left   Pain Descriptors / Indicators Sore   Pain Type Chronic pain   Pain Onset More than a month ago   Pain Frequency Constant   Aggravating Factors  overdoing it with exercise, moving the Lt shoulder overhead   Pain Relieving Factors ice, medication, heat   Multiple Pain Sites No                         [facility] Adult PT Treatment/Exercise - 09/05/15 0001     Exercises   Exercises Shoulder   Shoulder Exercises: Supine   External Rotation AROM;Left;5 reps  with knees rotation to right   Flexion AAROM;Both;20 reps  using cane   Other Supine Exercises arms in T position with knee lean to the right   Shoulder Exercises: Prone   Extension AROM;Left;10 reps   Horizontal ABduction 1 --   Other Prone Exercises row 10x   Shoulder Exercises: Sidelying   External Rotation Left;10 reps   Flexion AROM;Left;10 reps   ABduction AROM;Left;10 reps   Shoulder Exercises: Standing   Flexion AAROM;Left  on wall x 1 min   ABduction AAROM  gliding up wall   Shoulder Exercises: Pulleys   Flexion 2 minutes   Other Pulley Exercises 2min IR   Shoulder Exercises: ROM/Strengthening   UBE (Upper Arm Bike) Level 2 x 8 minutes (4/4)   Moist Heat Therapy   Number Minutes Moist Heat 15 Minutes   Moist Heat Location Shoulder   Manual Therapy   Manual Therapy Soft tissue mobilization   Soft tissue mobilization pectoralis, deltoid and ant capsule                  PT Short Term Goals - 09/02/15 1041    PT SHORT TERM  GOAL #1   Title She will be independent with inital HEP    Status Achieved   PT SHORT TERM GOAL #2   Title report a 25% reduction in Lt shoulder pain with use at home and work   Status Achieved   PT SHORT TERM GOAL #3   Title demonstrate Lt shoulder AROM IR to L2 at midline to improve use with self-care   Status Achieved           PT Long Term Goals - 09/05/15 1420    PT LONG TERM GOAL #1   Title be independent in advanced HEP   Time 6   Period Weeks   Status On-going   PT LONG TERM GOAL #2   Title improve FOTO to < or = to 46% limitation   Time 6   Period Weeks   Status Achieved   PT LONG TERM GOAL #3   Title report a 50% reduction in Lt shoulder pain with home tasks   Baseline more painful in the last week   Time 6   Period Weeks   PT LONG TERM GOAL #4   Title demonstrate 4+/5 Lt shoulder strength throughout to  improve endurance for Lt UE use   Baseline Pain with endrange left shoulder flexion   Time 8   Period Weeks   Status Achieved   PT LONG TERM GOAL #5   Title able to lift to waist and carry 10 pounds or more to increase home and work participation as MD allows   Time 6   Period Weeks   Status On-going   PT LONG TERM GOAL #6   Title demonstrate Lt shoulder IR to lacking < or = to 2 inches vs the Rt to improve self-care/dressing   Time 6   Period Weeks   Status On-going               Plan - 09/05/15 1413    Clinical Impression Statement Patient was able to tolerate AAROM into flexion/abduction in available range in standing and supine.     Rehab Potential Good   Clinical Impairments Affecting Rehab Potential 11/2014 Lt shoulder surgery of partail tear and distal clavicle excision   PT Frequency 2x / week   PT Duration 6 weeks   PT Treatment/Interventions ADLs/Self Care Home Management;Cryotherapy;Electrical Stimulation;Iontophoresis /ml Dexamethasone;Moist Heat;Therapeutic exercise;Therapeutic activities;Functional mobility training;Ultrasound;Neuromuscular re-education;Patient/family education;Manual techniques;Taping;Dry needling;Passive range of motion   PT Next Visit Plan Continue with shoulder progressive ROM, resume strengthening as tolerated;  modalities as needed   Consulted and Agree with Plan of Care Patient      Patient will benefit from skilled therapeutic intervention in order to improve the following deficits and impairments:  Postural dysfunction, Decreased strength, Impaired flexibility, Pain, Impaired UE functional use, Decreased activity tolerance, Decreased range of motion  Visit Diagnosis: Shoulder stiffness, left  Muscle weakness (generalized)  Pain in left shoulder     Problem List Patient Active Problem List   Diagnosis Date Noted  . Insomnia 10/29/2014  . Plantar fasciitis 12/02/2013  . Benign essential HTN 05/28/2013  . Hepatic steatosis  04/12/2011  . Back pain 06/16/2010  . HEMORRHOIDS, EXTERNAL 12/21/2009  . DEPRESSION, MAJOR, MODERATE 04/04/2007  . HAIR LOSS 08/23/2006  . OBESITY NOS 05/28/2006  . FATIGUE 05/28/2006  . GLUCOSE INTOLERANCE 05/23/2006  . Migraine 05/23/2006    NAUMANN-HOUEGNIFIO,Secily Walthour PTA 09/05/2015, 4:10 PM  [facility] [address], [address] [place], [place], [address] Phone: [phone]  Fax:  [phone]  Name: ABREANNA DRAWDY MRN: [id number] Date of Birth: 01-22-1976

## 2015-09-06 ENCOUNTER — Ambulatory Visit: Payer: PRIVATE HEALTH INSURANCE | Admitting: Physical Therapy

## 2015-09-08 ENCOUNTER — Ambulatory Visit: Payer: PRIVATE HEALTH INSURANCE | Admitting: Physical Therapy

## 2015-09-08 ENCOUNTER — Telehealth: Payer: Self-pay | Admitting: Physical Therapy

## 2015-09-08 NOTE — Telephone Encounter (Signed)
Pt was a no-show for Thursday, June 15,  at 9:30. PTA left message on voice mail to return our call. Desiree HaneElke Naumann Houegnifio, PTA

## 2015-09-12 ENCOUNTER — Ambulatory Visit: Payer: PRIVATE HEALTH INSURANCE | Admitting: Physical Therapy

## 2015-09-12 ENCOUNTER — Encounter: Payer: Self-pay | Admitting: Physical Therapy

## 2015-09-12 DIAGNOSIS — M25512 Pain in left shoulder: Secondary | ICD-10-CM

## 2015-09-12 DIAGNOSIS — M25612 Stiffness of left shoulder, not elsewhere classified: Secondary | ICD-10-CM

## 2015-09-12 DIAGNOSIS — M6281 Muscle weakness (generalized): Secondary | ICD-10-CM

## 2015-09-12 NOTE — Therapy (Signed)
[facility] [address], [address] [place], [place], [address] Phone: [phone]   Fax:  [phone]  Physical Therapy Treatment  Patient Details  Name: Hailey Hopkins MRN: [id number] Date of Birth: 01/12/1976 Referring Provider: Valma Cavaollins, Andrew, MD  Encounter Date: 09/12/2015      PT End of Session - 09/12/15 1009    Visit Number 21   Number of Visits 32   Date for PT Re-Evaluation 09/21/15   Authorization Type Workers Comp   Authorization - Visit Number 21   Authorization - Number of Visits 32   PT Start Time 0933   PT Stop Time 1033   PT Time Calculation (min) 60 min   Activity Tolerance Patient tolerated treatment well   Behavior During Therapy [facility] for tasks assessed/performed      Past Medical History  Diagnosis Date  . Diverticular disease   . Depression     Past Surgical History  Procedure Laterality Date  . Abdominal hysterectomy    . Rotator cuff repair      There were no vitals filed for this visit.      Subjective Assessment - 09/12/15 1001    Subjective Pt was driving yesterday and the left arm was irritated. Pain in Lt shoulder rated as a 3/10.    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  Lt shoulder surgery 12/14/2014 for partial rotator cuff repair and distal clavicle excision. March 15th injection into left shoulder under Ultrasound to break up scar tissue   Diagnostic tests injection: Cortizone 04/2015   Patient Stated Goals reduce pain, return to normal use of Lt UE   Currently in Pain? Yes   Pain Score 3    Pain Location Shoulder   Pain Orientation Left   Pain Descriptors / Indicators Sore   Pain Type Chronic pain   Pain Onset More than a month ago   Aggravating Factors  overdoing it iwth exercise, driving, moving the shoulder overhead   Pain Relieving Factors ice, medication, heat   Multiple Pain Sites No                         [facility] Adult PT  Treatment/Exercise - 09/12/15 0001    Exercises   Exercises Shoulder   Shoulder Exercises: Supine   External Rotation AROM;Left;5 reps  with knees rotated to side   Flexion AAROM;Both;20 reps  using cane   Other Supine Exercises arms in T position with knee lean to the right   Shoulder Exercises: Prone   Extension AROM;Left;10 reps  1# added   Other Prone Exercises row 10x  1# added   Shoulder Exercises: Sidelying   External Rotation Left;10 reps   Flexion AROM;Left;10 reps   ABduction AROM;Left;10 reps   Shoulder Exercises: Standing   Other Standing Exercises wall clock x 10   Shoulder Exercises: Pulleys   Other Pulley Exercises 3min IR   Shoulder Exercises: ROM/Strengthening   UBE (Upper Arm Bike) Level 2 x 8 minutes (4/4), sitting on blue ball   Moist Heat Therapy   Number Minutes Moist Heat 15 Minutes   Moist Heat Location Shoulder                  PT Short Term Goals - 09/02/15 1041    PT SHORT TERM GOAL #1   Title She will be independent with inital HEP    Status Achieved   PT SHORT TERM GOAL #2  Title report a 25% reduction in Lt shoulder pain with use at home and work   Status Achieved   PT SHORT TERM GOAL #3   Title demonstrate Lt shoulder AROM IR to L2 at midline to improve use with self-care   Status Achieved           PT Long Term Goals - 09/12/15 1013    PT LONG TERM GOAL #1   Title be independent in advanced HEP   Time 6   Period Weeks   Status On-going   PT LONG TERM GOAL #2   Title improve FOTO to < or = to 46% limitation   Baseline 110 today   Time 6   Period Weeks   Status Achieved   PT LONG TERM GOAL #3   Title report a 50% reduction in Lt shoulder pain with home tasks   Baseline more painful in the last week   Time 6   Period Weeks   Status On-going   PT LONG TERM GOAL #4   Title demonstrate 4+/5 Lt shoulder strength throughout to improve endurance for Lt UE use   Baseline Pain with endrange left shoulder flexion   Time  8   Period Weeks   Status Achieved   PT LONG TERM GOAL #5   Title able to lift to waist and carry 10 pounds or more to increase home and work participation as MD allows   Baseline able to do in clinic    Time 6   Period Weeks   Status On-going   PT LONG TERM GOAL #6   Title demonstrate Lt shoulder IR to lacking < or = to 2 inches vs the Rt to improve self-care/dressing   Time 6   Period Weeks   Status On-going               Plan - 09/12/15 1010    Clinical Impression Statement Patient with improved IR ater pulley exercises, all over improved AROM in left shoulder as visually observed with activities.    Rehab Potential Good   Clinical Impairments Affecting Rehab Potential 11/2014 Lt shoulder surgery of partail tear and distal clavicle excision   PT Frequency 2x / week   PT Duration 6 weeks   PT Treatment/Interventions ADLs/Self Care Home Management;Cryotherapy;Electrical Stimulation;Iontophoresis /ml Dexamethasone;Moist Heat;Therapeutic exercise;Therapeutic activities;Functional mobility training;Ultrasound;Neuromuscular re-education;Patient/family education;Manual techniques;Taping;Dry needling;Passive range of motion   PT Next Visit Plan Continue with shoulder progressive ROM, resume strengthening as tolerated;  modalities as needed   Consulted and Agree with Plan of Care Patient      Patient will benefit from skilled therapeutic intervention in order to improve the following deficits and impairments:  Postural dysfunction, Decreased strength, Impaired flexibility, Pain, Impaired UE functional use, Decreased activity tolerance, Decreased range of motion  Visit Diagnosis: Shoulder stiffness, left  Muscle weakness (generalized)  Pain in left shoulder     Problem List Patient Active Problem List   Diagnosis Date Noted  . Insomnia 10/29/2014  . Plantar fasciitis 12/02/2013  . Benign essential HTN 05/28/2013  . Hepatic steatosis 04/12/2011  . Back pain 06/16/2010   . HEMORRHOIDS, EXTERNAL 12/21/2009  . DEPRESSION, MAJOR, MODERATE 04/04/2007  . HAIR LOSS 08/23/2006  . OBESITY NOS 05/28/2006  . FATIGUE 05/28/2006  . GLUCOSE INTOLERANCE 05/23/2006  . Migraine 05/23/2006    NAUMANN-HOUEGNIFIO,Shatha Hooser PTA 09/12/2015, 4:06 PM  [facility] [address] [place], [place], [address] Phone: [phone]   Fax:  [phone]  Name: Hailey Hopkins MRN: [id number] Date of Birth: Aug 04, 1975

## 2015-09-28 ENCOUNTER — Encounter: Payer: Self-pay | Admitting: Physical Therapy

## 2015-09-28 ENCOUNTER — Ambulatory Visit: Payer: PRIVATE HEALTH INSURANCE | Attending: Surgical | Admitting: Physical Therapy

## 2015-09-28 DIAGNOSIS — M25612 Stiffness of left shoulder, not elsewhere classified: Secondary | ICD-10-CM | POA: Insufficient documentation

## 2015-09-28 DIAGNOSIS — M25512 Pain in left shoulder: Secondary | ICD-10-CM | POA: Diagnosis present

## 2015-09-28 DIAGNOSIS — R293 Abnormal posture: Secondary | ICD-10-CM | POA: Diagnosis present

## 2015-09-28 DIAGNOSIS — M6281 Muscle weakness (generalized): Secondary | ICD-10-CM | POA: Insufficient documentation

## 2015-09-28 NOTE — Therapy (Signed)
[facility] [address], [place], [place], [address] Phone: [phone]   Fax:  [phone]  Physical Therapy Treatment  Patient Details  Name: Hailey Hopkins MRN: [id number] Date of Birth: September 23, 1975 Referring Provider: Hart Robinsons, MD  Encounter Date: 09/28/2015      PT End of Session - 09/28/15 0927    Visit Number 22   Number of Visits 32   Date for PT Re-Evaluation 11/09/15   Authorization Type Workers Comp   Authorization - Visit Number 22   Authorization - Number of Visits 32   PT Start Time [phone]   PT Stop Time 1015   PT Time Calculation (min) 50 min   Activity Tolerance Patient tolerated treatment well   Behavior During Therapy [facility] for tasks assessed/performed      Past Medical History  Diagnosis Date  . Diverticular disease   . Depression     Past Surgical History  Procedure Laterality Date  . Abdominal hysterectomy    . Rotator cuff repair      There were no vitals filed for this visit.      Subjective Assessment - 09/28/15 0926    Subjective Not bad this AM being early.   Currently in Pain? Yes   Pain Score 2    Pain Location Shoulder   Pain Orientation Left   Pain Descriptors / Indicators Sore   Aggravating Factors  Overdoing it   Pain Relieving Factors ice, meds,    Multiple Pain Sites No            [facility] PT Assessment - 09/28/15 0001    Assessment   Medical Diagnosis post-op stiffness of Lt shoulder, Lt pectoralis strain, distal clavicle excision, repair of partial tear of rotator cuff and bone spur   Onset Date/Surgical Date 12/14/14   Hand Dominance Right   Cognition   Overall Cognitive Status Within Functional Limits for tasks assessed   Observation/Other Assessments   Focus on Therapeutic Outcomes (FOTO)  39% limitation   AROM   Left Shoulder Extension 45 Degrees   Left Shoulder Flexion 150 Degrees   Left Shoulder ABduction 150 Degrees   Left Shoulder  Internal Rotation --  upper lumbar    Left Shoulder External Rotation 75 Degrees   Strength   Strength Assessment Site Shoulder   Right/Left Shoulder Left   Left Shoulder Flexion 4+/5   Left Shoulder ABduction 4+/5   Left Shoulder Internal Rotation 5/5   Left Shoulder External Rotation 4+/5                     [facility] Adult PT Treatment/Exercise - 09/28/15 0001    Shoulder Exercises: Supine   Flexion Strengthening;Left;20 reps;Weights   Shoulder Flexion Weight (lbs) 1   Shoulder Exercises: Sidelying   External Rotation Strengthening;Left;20 reps;Weights   External Rotation Weight (lbs) 1   ABduction Strengthening;Left;20 reps;Weights   ABduction Weight (lbs) 1   Shoulder Exercises: Standing   Other Standing Exercises wall clock x 10   Other Standing Exercises red rockwood 2 x10 each   Shoulder Exercises: Pulleys   Flexion 3 minutes   Other Pulley Exercises 68mn IR   Shoulder Exercises: ROM/Strengthening   UBE (Upper Arm Bike) Level 2 x 8 minutes (4/4), sitting on blue ball   Moist Heat Therapy   Number Minutes Moist Heat 15 Minutes   Moist Heat Location Shoulder  PT Short Term Goals - 09/02/15 1041    PT SHORT TERM GOAL #1   Title She will be independent with inital HEP    Status Achieved   PT SHORT TERM GOAL #2   Title report a 25% reduction in Lt shoulder pain with use at home and work   Status Achieved   PT SHORT TERM GOAL #3   Title demonstrate Lt shoulder AROM IR to L2 at midline to improve use with self-care   Status Achieved           PT Long Term Goals - 09/28/15 0936    PT LONG TERM GOAL #1   Title be independent in advanced HEP   Time 6   Period Weeks   Status On-going   PT LONG TERM GOAL #2   Title improve FOTO to < or = to 46% limitation   Time 6   Period Weeks   Status Achieved   PT LONG TERM GOAL #3   Title report a 50% reduction in Lt shoulder pain with home tasks   Time 6   Period Weeks   Status  Achieved  50%   PT LONG TERM GOAL #4   Title demonstrate 4+/5 Lt shoulder strength throughout to improve endurance for Lt UE use   Time 8   Period Weeks   Status Achieved   PT LONG TERM GOAL #5   Title able to lift to waist and carry 10 pounds or more to increase home and work participation as MD allows   Time 6   Period Weeks   Status On-going  Currently under 5# lifting restriction               Plan - 09/28/15 4268    Clinical Impression Statement Pt's AROM in all motions has greatly improved since last measurements. Strength holding steady, was able to add 1# wts today to her strengthening exercises. Has not met lifting goal due to being under 5# lifting restriction by MD.    Rehab Potential Good   Clinical Impairments Affecting Rehab Potential 11/2014 Lt shoulder surgery of partail tear and distal clavicle excision   PT Frequency 2x / week   PT Duration 6 weeks   PT Treatment/Interventions ADLs/Self Care Home Management;Cryotherapy;Electrical Stimulation;Iontophoresis 71m/ml Dexamethasone;Moist Heat;Therapeutic exercise;Therapeutic activities;Functional mobility training;Ultrasound;Neuromuscular re-education;Patient/family education;Manual techniques;Taping;Dry needling;Passive range of motion   Consulted and Agree with Plan of Care Patient      Patient will benefit from skilled therapeutic intervention in order to improve the following deficits and impairments:  Postural dysfunction, Decreased strength, Impaired flexibility, Pain, Impaired UE functional use, Decreased activity tolerance, Decreased range of motion  Visit Diagnosis: Shoulder stiffness, left - Plan: PT plan of care cert/re-cert  Muscle weakness (generalized) - Plan: PT plan of care cert/re-cert  Pain in left shoulder - Plan: PT plan of care cert/re-cert     Problem List Patient Active Problem List   Diagnosis Date Noted  . Insomnia 10/29/2014  . Plantar fasciitis 12/02/2013  . Benign essential HTN  05/28/2013  . Hepatic steatosis 04/12/2011  . Back pain 06/16/2010  . HEMORRHOIDS, EXTERNAL 12/21/2009  . DEPRESSION, MAJOR, MODERATE 04/04/2007  . HAIR LOSS 08/23/2006  . OBESITY NOS 05/28/2006  . FATIGUE 05/28/2006  . GLUCOSE INTOLERANCE 05/23/2006  . Migraine 05/23/2006    JMyrene Galas PTA 09/28/2015 10:03 AM  [facility] [address] [place] [address]: [phone]  Fax:  [phone] Name: FKaylon  LYNSEE Hopkins MRN: [id number] Date of Birth: [birth year]

## 2015-09-29 ENCOUNTER — Ambulatory Visit: Payer: PRIVATE HEALTH INSURANCE

## 2015-09-29 DIAGNOSIS — R293 Abnormal posture: Secondary | ICD-10-CM

## 2015-09-29 DIAGNOSIS — M25512 Pain in left shoulder: Secondary | ICD-10-CM

## 2015-09-29 DIAGNOSIS — M6281 Muscle weakness (generalized): Secondary | ICD-10-CM

## 2015-09-29 DIAGNOSIS — M25612 Stiffness of left shoulder, not elsewhere classified: Secondary | ICD-10-CM

## 2015-09-29 NOTE — Therapy (Signed)
[facility] [address], [address] [place], [place], [address] Phone: [phone]   Fax:  [phone]  Physical Therapy Treatment  Patient Details  Name: Hailey Hopkins MRN: [id number] Date of Birth: June 15, 1975 Referring Provider: Valma Cavaollins, Andrew, MD  Encounter Date: 09/29/2015      PT End of Session - 09/29/15 1050    Visit Number 23   Number of Visits 32   Date for PT Re-Evaluation 11/09/15   Authorization Type Workers Comp   Authorization - Visit Number 23   Authorization - Number of Visits 32   PT Start Time 1012   PT Stop Time 1103   PT Time Calculation (min) 51 min   Activity Tolerance Patient tolerated treatment well   Behavior During Therapy [facility] for tasks assessed/performed      Past Medical History  Diagnosis Date  . Diverticular disease   . Depression     Past Surgical History  Procedure Laterality Date  . Abdominal hysterectomy    . Rotator cuff repair      There were no vitals filed for this visit.      Subjective Assessment - 09/29/15 1017    Subjective Feeling sore in the Lt shoulder today.     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  Lt shoulder surgery 12/14/2014 for partial rotator cuff repair and distal clavicle excision. March 15th injection into left shoulder under Ultrasound to break up scar tissue   Currently in Pain? Yes   Pain Score 4    Pain Location Shoulder   Pain Orientation Left   Pain Descriptors / Indicators Sore   Pain Type Chronic pain   Pain Onset More than a month ago   Pain Frequency Constant   Aggravating Factors  overdoing it, use   Pain Relieving Factors ice, meds                         [facility] Adult PT Treatment/Exercise - 09/29/15 0001    Shoulder Exercises: Supine   Flexion Strengthening;Left;20 reps;Weights   Shoulder Flexion Weight (lbs) 1   Shoulder Exercises: Seated   Flexion Strengthening;Left;Weights;15 reps   Flexion Weight (lbs) 1   Abduction Strengthening;Left;20 reps;Weights  also scaption x 20   ABduction Weight (lbs) 1   Shoulder Exercises: Sidelying   External Rotation Strengthening;Left;20 reps;Weights   External Rotation Weight (lbs) 1   ABduction Strengthening;Left;20 reps;Weights   ABduction Weight (lbs) 1   Shoulder Exercises: Standing   Other Standing Exercises wall clock x 10   Other Standing Exercises red rockwood 2 x10 each   Shoulder Exercises: Pulleys   Flexion 3 minutes   Other Pulley Exercises 3min IR   Shoulder Exercises: ROM/Strengthening   UBE (Upper Arm Bike) Level 2 x 8 minutes (4/4), sitting on blue ball   Moist Heat Therapy   Number Minutes Moist Heat 15 Minutes   Moist Heat Location Shoulder                  PT Short Term Goals - 09/02/15 1041    PT SHORT TERM GOAL #1   Title She will be independent with inital HEP    Status Achieved   PT SHORT TERM GOAL #2   Title report a 25% reduction in Lt shoulder pain with use at home and work   Status Achieved   PT SHORT TERM GOAL #3   Title demonstrate Lt shoulder AROM IR to L2 at midline  to improve use with self-care   Status Achieved           PT Long Term Goals - 09/29/15 1021    PT LONG TERM GOAL #1   Title be independent in advanced HEP   Time 6   Period Weeks   Status On-going   PT LONG TERM GOAL #2   Title improve FOTO to < or = to 46% limitation   Time 6   Period Weeks   Status Achieved   PT LONG TERM GOAL #3   Title report a 50% reduction in Lt shoulder pain with home tasks   Status Achieved   PT LONG TERM GOAL #5   Title able to lift to waist and carry 10 pounds or more to increase home and work participation as MD allows   Time 6   Period Weeks   Status On-going               Plan - 09/29/15 1022    Clinical Impression Statement Pt with continued Lt shoulder pain that is intermittent.  Lt shoulder AROM is improved and strength is still limited.  Pt has been able to  tolerate increased weights with strength exercises.  Pt is limited to lifting 5# with ADLs and self-care so pt not able to challenge endurance at home.  Pt will cotninue to benefit from skilled PT for strength, endurance, flexibliyt and pain management as needed.     Rehab Potential Good   Clinical Impairments Affecting Rehab Potential 11/2014 Lt shoulder surgery of partail tear and distal clavicle excision   PT Frequency 2x / week   PT Duration 6 weeks   PT Treatment/Interventions ADLs/Self Care Home Management;Cryotherapy;Electrical Stimulation;Iontophoresis 4mg /ml Dexamethasone;Moist Heat;Therapeutic exercise;Therapeutic activities;Functional mobility training;Ultrasound;Neuromuscular re-education;Patient/family education;Manual techniques;Taping;Dry needling;Passive range of motion   PT Next Visit Plan Continue with shoulder progressive ROM, resume strengthening as tolerated;  modalities as needed   Consulted and Agree with Plan of Care Patient      Patient will benefit from skilled therapeutic intervention in order to improve the following deficits and impairments:  Postural dysfunction, Decreased strength, Impaired flexibility, Pain, Impaired UE functional use, Decreased activity tolerance, Decreased range of motion  Visit Diagnosis: Shoulder stiffness, left  Muscle weakness (generalized)  Pain in left shoulder  Abnormal posture     Problem List Patient Active Problem List   Diagnosis Date Noted  . Insomnia 10/29/2014  . Plantar fasciitis 12/02/2013  . Benign essential HTN 05/28/2013  . Hepatic steatosis 04/12/2011  . Back pain 06/16/2010  . HEMORRHOIDS, EXTERNAL 12/21/2009  . DEPRESSION, MAJOR, MODERATE 04/04/2007  . HAIR LOSS 08/23/2006  . OBESITY NOS 05/28/2006  . FATIGUE 05/28/2006  . GLUCOSE INTOLERANCE 05/23/2006  . Migraine 05/23/2006     Lorrene ReidKelly Evolette Pendell, PT 09/29/2015 10:51 AM  [facility] [address] [place], [place], [address] Phone: [phone]   Fax:  [phone]  Name: Hailey Hopkins MRN: [id number] Date of Birth: 1975-10-07

## 2015-10-04 ENCOUNTER — Ambulatory Visit: Payer: PRIVATE HEALTH INSURANCE

## 2015-10-04 DIAGNOSIS — M6281 Muscle weakness (generalized): Secondary | ICD-10-CM

## 2015-10-04 DIAGNOSIS — M25612 Stiffness of left shoulder, not elsewhere classified: Secondary | ICD-10-CM | POA: Diagnosis not present

## 2015-10-04 DIAGNOSIS — R293 Abnormal posture: Secondary | ICD-10-CM

## 2015-10-04 DIAGNOSIS — M25512 Pain in left shoulder: Secondary | ICD-10-CM

## 2015-10-04 NOTE — Therapy (Signed)
[facility] [address], [address] [place], [place], [address] Phone: [phone]   Fax:  [phone]  Physical Therapy Treatment  Patient Details  Name: Hailey Hopkins MRN: [id number] Date of Birth: 1975-12-22 Referring Provider: Valma Cava, MD  Encounter Date: 10/04/2015      PT End of Session - 10/04/15 1140    Visit Number 24   Number of Visits 32   Date for PT Re-Evaluation 11/09/15   Authorization Type Workers Comp   Authorization - Visit Number 24   Authorization - Number of Visits 32   PT Start Time 1100   PT Stop Time 1156   PT Time Calculation (min) 56 min   Activity Tolerance Patient tolerated treatment well   Behavior During Therapy [facility] for tasks assessed/performed      Past Medical History  Diagnosis Date  . Diverticular disease   . Depression     Past Surgical History  Procedure Laterality Date  . Abdominal hysterectomy    . Rotator cuff repair      There were no vitals filed for this visit.      Subjective Assessment - 10/04/15 1103    Subjective Shoulder is feeling a little bit better today.  I had a rough weekend.  Did a lot of driving and holding the steering wheel aggravated it.     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  Lt shoulder surgery 12/14/2014 for partial rotator cuff repair and distal clavicle excision. March 15th injection into left shoulder under Ultrasound to break up scar tissue   Currently in Pain? Yes   Pain Score 4    Pain Location Shoulder   Pain Orientation Left   Pain Descriptors / Indicators Sore   Pain Type Chronic pain   Pain Onset More than a month ago   Pain Frequency Constant   Aggravating Factors  driving, overdoing it, use   Pain Relieving Factors ice, meds            [facility] PT Assessment - 10/04/15 0001    Assessment   Medical Diagnosis post-op stiffness of Lt shoulder, Lt pectoralis strain, distal clavicle excision, repair  of partial tear of rotator cuff and bone spur   Onset Date/Surgical Date 12/14/14   Hand Dominance Right   Cognition   Overall Cognitive Status Within Functional Limits for tasks assessed   Observation/Other Assessments   Focus on Therapeutic Outcomes (FOTO)  33% limitation   ROM / Strength   AROM / PROM / Strength AROM   AROM   Right/Left Shoulder Left   Left Shoulder Flexion 150 Degrees   Left Shoulder ABduction 145 Degrees   Left Shoulder Internal Rotation --  lacking 5 inches vs the Rt   Left Shoulder External Rotation --  to T2   Strength   Strength Assessment Site Shoulder   Right/Left Shoulder Left   Left Shoulder Flexion 4+/5   Left Shoulder ABduction 4+/5   Left Shoulder Internal Rotation 5/5   Left Shoulder External Rotation 4+/5                     [facility] Adult PT Treatment/Exercise - 10/04/15 0001    Shoulder Exercises: Supine   Flexion Strengthening;Left;20 reps;Weights   Shoulder Flexion Weight (lbs) 2   Shoulder Exercises: Seated   Flexion Strengthening;Left;Weights;15 reps   Flexion Weight (lbs) 2   Abduction Strengthening;Left;20 reps;Weights  also scaption x 20   ABduction Weight (lbs) 2  Shoulder Exercises: Sidelying   External Rotation Strengthening;Left;20 reps;Weights   External Rotation Weight (lbs) 2   ABduction Strengthening;Left;20 reps;Weights   ABduction Weight (lbs) 2   Shoulder Exercises: Standing   Other Standing Exercises wall clock x 10   Shoulder Exercises: Pulleys   Flexion 3 minutes   Other Pulley Exercises IR   Shoulder Exercises: ROM/Strengthening   UBE (Upper Arm Bike) Level 2 x 8 minutes (4/4), sitting on blue ball   Moist Heat Therapy   Number Minutes Moist Heat 15 Minutes   Moist Heat Location Shoulder                  PT Short Term Goals - 09/02/15 1041    PT SHORT TERM GOAL #1   Title She will be independent with inital HEP    Status Achieved   PT SHORT TERM GOAL #2   Title report a 25%  reduction in Lt shoulder pain with use at home and work   Status Achieved   PT SHORT TERM GOAL #3   Title demonstrate Lt shoulder AROM IR to L2 at midline to improve use with self-care   Status Achieved           PT Long Term Goals - 10/04/15 1105    PT LONG TERM GOAL #1   Title be independent in advanced HEP   Time 6   Period Weeks   Status On-going   PT LONG TERM GOAL #5   Title able to lift to waist and carry 10 pounds or more to increase home and work participation as MD allows   Time 6   Period Weeks   Status On-going   PT LONG TERM GOAL #6   Title demonstrate Lt shoulder IR to lacking < or = to 2 inches vs the Rt to improve self-care/dressing   Time 6   Period Weeks   Status On-going               Plan - 10/04/15 1110    Clinical Impression Statement Pt with continued Lt shoulder pain that is intermittent.  AROM is full except Lt IR limited by 5 inches with pain with this motion.  Shoulder strength is improved with 4+/5 flexion, abduction and ER and 5/5 IR.  FOTO score is improved to 33% limitation (46% limitation at evaluation).  Pt is tolerating advancement of strength exercises in the clinic.  Pt is limited to endurance tasks and limited to lifting > 5# per MD.  Pt will continue to benefit from skilled PT for LT shoulder strength, endurance and flexibility to improve use for endurance tasks and reduce pain.     Rehab Potential Good   Clinical Impairments Affecting Rehab Potential 11/2014 Lt shoulder surgery of partail tear and distal clavicle excision   PT Frequency 2x / week   PT Duration 6 weeks   PT Treatment/Interventions ADLs/Self Care Home Management;Cryotherapy;Electrical Stimulation;Iontophoresis /ml Dexamethasone;Moist Heat;Therapeutic exercise;Therapeutic activities;Functional mobility training;Ultrasound;Neuromuscular re-education;Patient/family education;Manual techniques;Taping;Dry needling;Passive range of motion   PT Next Visit Plan Continue with  shoulder progressive ROM, resume strengthening as tolerated;  modalities as needed   Consulted and Agree with Plan of Care Patient      Patient will benefit from skilled therapeutic intervention in order to improve the following deficits and impairments:  Postural dysfunction, Decreased strength, Impaired flexibility, Pain, Impaired UE functional use, Decreased activity tolerance, Decreased range of motion  Visit Diagnosis: Shoulder stiffness, left  Muscle weakness (generalized)  Pain in left  shoulder  Abnormal posture     Problem List Patient Active Problem List   Diagnosis Date Noted  . Insomnia 10/29/2014  . Plantar fasciitis 12/02/2013  . Benign essential HTN 05/28/2013  . Hepatic steatosis 04/12/2011  . Back pain 06/16/2010  . HEMORRHOIDS, EXTERNAL 12/21/2009  . DEPRESSION, MAJOR, MODERATE 04/04/2007  . HAIR LOSS 08/23/2006  . OBESITY NOS 05/28/2006  . FATIGUE 05/28/2006  . GLUCOSE INTOLERANCE 05/23/2006  . Migraine 05/23/2006     Lorrene ReidKelly Robertson Colclough, PT 10/04/2015 11:41 AM  [facility] [address], [address] [place], [place], [address] Phone: [phone]   Fax:  [phone]  Name: Larinda ButteryFelicia G Shiflett MRN: [id number] Date of Birth: Oct 10, 1975

## 2015-10-06 ENCOUNTER — Ambulatory Visit: Payer: PRIVATE HEALTH INSURANCE

## 2015-10-11 ENCOUNTER — Ambulatory Visit: Payer: PRIVATE HEALTH INSURANCE | Admitting: Physical Therapy

## 2015-10-13 ENCOUNTER — Ambulatory Visit: Payer: PRIVATE HEALTH INSURANCE

## 2015-10-13 DIAGNOSIS — R293 Abnormal posture: Secondary | ICD-10-CM

## 2015-10-13 DIAGNOSIS — M25512 Pain in left shoulder: Secondary | ICD-10-CM

## 2015-10-13 DIAGNOSIS — M25612 Stiffness of left shoulder, not elsewhere classified: Secondary | ICD-10-CM

## 2015-10-13 DIAGNOSIS — M6281 Muscle weakness (generalized): Secondary | ICD-10-CM

## 2015-10-13 NOTE — Therapy (Signed)
[facility] [address] [place], [place], [address] Phone: [phone]   Fax:  [phone]  Physical Therapy Treatment  Patient Details  Name: Hailey Hopkins MRN: [id number] Date of Birth: 05-Jul-1975 Referring Provider: Valma Cava, MD  Encounter Date: 10/13/2015      PT End of Session - 10/13/15 1048    Visit Number 25   Date for PT Re-Evaluation 11/09/15   Authorization Type Workers Comp   Authorization - Visit Number 25   Authorization - Number of Visits 32   PT Start Time 1015   PT Stop Time 1108   PT Time Calculation (min) 53 min   Activity Tolerance Patient tolerated treatment well   Behavior During Therapy [facility] for tasks assessed/performed      Past Medical History  Diagnosis Date  . Diverticular disease   . Depression     Past Surgical History  Procedure Laterality Date  . Abdominal hysterectomy    . Rotator cuff repair      There were no vitals filed for this visit.      Subjective Assessment - 10/13/15 1028    Subjective Pt saw MD last week.  He thinks that she has impingement and will do manipulation and distal clavical recection.  Waiting to schedule the surgery.   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  Lt shoulder surgery 12/14/2014 for partial rotator cuff repair and distal clavicle excision. March 15th injection into left shoulder under Ultrasound to break up scar tissue   Patient Stated Goals reduce pain, return to normal use of Lt UE   Currently in Pain? Yes   Pain Score 2    Pain Location Shoulder   Pain Orientation Left   Pain Descriptors / Indicators Sore   Pain Type Chronic pain   Pain Onset More than a month ago   Pain Frequency Constant   Aggravating Factors  driving, overdoing it, use   Pain Relieving Factors ice, meds                         [facility] Adult PT Treatment/Exercise - 10/13/15 0001    Shoulder Exercises: Supine   Flexion Strengthening;Left;20 reps;Weights   Shoulder Flexion Weight (lbs) 2   Shoulder Exercises: Seated   Flexion Strengthening;Left;Weights;15 reps   Flexion Weight (lbs) 2   Abduction Strengthening;Left;20 reps;Weights  also scaption x 20   ABduction Weight (lbs) 2   Shoulder Exercises: Sidelying   External Rotation Strengthening;Left;20 reps;Weights   External Rotation Weight (lbs) 2   ABduction Strengthening;Left;20 reps;Weights   ABduction Weight (lbs) 2   Shoulder Exercises: Standing   Other Standing Exercises wall clock x 10   Shoulder Exercises: Pulleys   Flexion 3 minutes   ABduction 3 minutes   Other Pulley Exercises IR   Shoulder Exercises: ROM/Strengthening   UBE (Upper Arm Bike) Level 2 x 8 minutes (4/4), sitting on blue ball   Moist Heat Therapy   Number Minutes Moist Heat 15 Minutes   Moist Heat Location Shoulder                  PT Short Term Goals - 09/02/15 1041    PT SHORT TERM GOAL #1   Title She will be independent with inital HEP    Status Achieved   PT SHORT TERM GOAL #2   Title report a 25% reduction in Lt shoulder pain with use at home and work  Status Achieved   PT SHORT TERM GOAL #3   Title demonstrate Lt shoulder AROM IR to L2 at midline to improve use with self-care   Status Achieved           PT Long Term Goals - 10/13/15 1030    PT LONG TERM GOAL #1   Title be independent in advanced HEP   Time 6   Period Weeks   PT LONG TERM GOAL #2   Title improve FOTO to < or = to 46% limitation   Status Achieved   PT LONG TERM GOAL #3   Title report a 50% reduction in Lt shoulder pain with home tasks   Status Achieved   PT LONG TERM GOAL #4   Status Achieved   PT LONG TERM GOAL #5   Title able to lift to waist and carry 10 pounds or more to increase home and work participation as MD allows   Time 6   Period Weeks   Status On-going   PT LONG TERM GOAL #6   Title demonstrate Lt shoulder IR to lacking < or = to 2 inches vs  the Rt to improve self-care/dressing   Time 6   Period Weeks   Status On-going               Plan - 10/13/15 1031    Clinical Impression Statement Pt saw MD and will be having Lt shoulder surgery in the near future.  Pt is waiting for approval for surgery.  Lt IR is limited by 5 inches vs the Rt.  Pt with 4+/5 Lt shoulder strength.  Pt is tolerating advancement of strength exercises in the clinic.  Pt is limited to endurance tasks and limited to lifting >5# per MD.  Pt will continue to benefit from skilled PT for LT shoulder strength, endurance and flexiblity to improve use for endurance tasks and reduce pain.   Rehab Potential Good   Clinical Impairments Affecting Rehab Potential 11/2014 Lt shoulder surgery of partail tear and distal clavicle excision   PT Frequency 2x / week   PT Duration 6 weeks   PT Treatment/Interventions ADLs/Self Care Home Management;Cryotherapy;Electrical Stimulation;Iontophoresis 4mg /ml Dexamethasone;Moist Heat;Therapeutic exercise;Therapeutic activities;Functional mobility training;Ultrasound;Neuromuscular re-education;Patient/family education;Manual techniques;Taping;Dry needling;Passive range of motion   PT Next Visit Plan Continue with shoulder progressive ROM, advance strength as tolerated;  modalities as needed   Consulted and Agree with Plan of Care Patient      Patient will benefit from skilled therapeutic intervention in order to improve the following deficits and impairments:  Postural dysfunction, Decreased strength, Impaired flexibility, Pain, Impaired UE functional use, Decreased activity tolerance, Decreased range of motion  Visit Diagnosis: Shoulder stiffness, left  Muscle weakness (generalized)  Pain in left shoulder  Abnormal posture     Problem List Patient Active Problem List   Diagnosis Date Noted  . Insomnia 10/29/2014  . Plantar fasciitis 12/02/2013  . Benign essential HTN 05/28/2013  . Hepatic steatosis 04/12/2011  . Back  pain 06/16/2010  . HEMORRHOIDS, EXTERNAL 12/21/2009  . DEPRESSION, MAJOR, MODERATE 04/04/2007  . HAIR LOSS 08/23/2006  . OBESITY NOS 05/28/2006  . FATIGUE 05/28/2006  . GLUCOSE INTOLERANCE 05/23/2006  . Migraine 05/23/2006     Lorrene ReidKelly Kalan Yeley, PT 10/13/2015 10:50 AM  [facility] [address], [address] [place], [place], [address] Phone: [phone]   Fax:  [phone]  Name: Hailey Hopkins MRN: [id number] Date of Birth: 12-11-1975

## 2015-10-17 ENCOUNTER — Ambulatory Visit: Payer: PRIVATE HEALTH INSURANCE

## 2015-10-17 DIAGNOSIS — M25512 Pain in left shoulder: Secondary | ICD-10-CM

## 2015-10-17 DIAGNOSIS — M6281 Muscle weakness (generalized): Secondary | ICD-10-CM

## 2015-10-17 DIAGNOSIS — R293 Abnormal posture: Secondary | ICD-10-CM

## 2015-10-17 DIAGNOSIS — M25612 Stiffness of left shoulder, not elsewhere classified: Secondary | ICD-10-CM | POA: Diagnosis not present

## 2015-10-17 NOTE — Therapy (Signed)
[facility] [address] [place], [place], [address] Phone: [phone]   Fax:  [phone]  Physical Therapy Treatment  Patient Details  Name: Hailey Hopkins MRN: [id number] Date of Birth: 1975-11-26 Referring Provider: Valma Cava, MD  Encounter Date: 10/17/2015      PT End of Session - 10/17/15 1140    Visit Number 26   Number of Visits 32   Date for PT Re-Evaluation 11/09/15   Authorization Type Workers Comp   Authorization - Visit Number 26   Authorization - Number of Visits 32   PT Start Time 1101   PT Stop Time 1142   PT Time Calculation (min) 41 min   Activity Tolerance Patient tolerated treatment well   Behavior During Therapy [facility] for tasks assessed/performed      Past Medical History:  Diagnosis Date  . Depression   . Diverticular disease     Past Surgical History:  Procedure Laterality Date  . ABDOMINAL HYSTERECTOMY    . ROTATOR CUFF REPAIR      There were no vitals filed for this visit.      Subjective Assessment - 10/17/15 1107    Subjective Still no word on when she will have surgery for distal clavical resection and maniuplation.   Patient Stated Goals reduce pain, return to normal use of Lt UE   Currently in Pain? Yes   Pain Score 3    Pain Location Shoulder   Pain Orientation Left   Pain Descriptors / Indicators Sore   Pain Type Chronic pain   Pain Onset More than a month ago   Pain Frequency Constant   Aggravating Factors  driving, overdoing it   Pain Relieving Factors ice, meds                         [facility] Adult PT Treatment/Exercise - 10/17/15 0001      Shoulder Exercises: Seated   Flexion Strengthening;Left;Weights;15 reps   Flexion Weight (lbs) 2   Abduction Strengthening;Left;20 reps;Weights  also scaption x 20   ABduction Weight (lbs) 2     Shoulder Exercises: Sidelying   External Rotation Strengthening;Left;20 reps;Weights   External  Rotation Weight (lbs) 2   ABduction Strengthening;Left;20 reps;Weights   ABduction Weight (lbs) 2     Shoulder Exercises: Standing   Other Standing Exercises wall clock x 10     Shoulder Exercises: Pulleys   Flexion 3 minutes   ABduction 3 minutes   Other Pulley Exercises IR     Shoulder Exercises: ROM/Strengthening   UBE (Upper Arm Bike) Level 2 x 8 minutes (4/4), sitting on blue ball                  PT Short Term Goals - 09/02/15 1041      PT SHORT TERM GOAL #1   Title She will be independent with inital HEP    Status Achieved     PT SHORT TERM GOAL #2   Title report a 25% reduction in Lt shoulder pain with use at home and work   Status Achieved     PT SHORT TERM GOAL #3   Title demonstrate Lt shoulder AROM IR to L2 at midline to improve use with self-care   Status Achieved           PT Long Term Goals - 10/17/15 1111      PT LONG TERM GOAL #1   Title be  independent in advanced HEP   Time 6   Period Weeks   Status On-going     PT LONG TERM GOAL #2   Title improve FOTO to < or = to 46% limitation   Time 6   Period Weeks   Status On-going     PT LONG TERM GOAL #3   Title report a 50% reduction in Lt shoulder pain with home tasks   Status Achieved     PT LONG TERM GOAL #4   Title demonstrate 4+/5 Lt shoulder strength throughout to improve endurance for Lt UE use   Status Achieved     PT LONG TERM GOAL #5   Title able to lift to waist and carry 10 pounds or more to increase home and work participation as MD allows   Time 6   Period Weeks   Status On-going     PT LONG TERM GOAL #6   Title demonstrate Lt shoulder IR to lacking < or = to 2 inches vs the Rt to improve self-care/dressing   Time 6   Period Weeks   Status On-going               Plan - 10/17/15 1112    Clinical Impression Statement Pt will have Lt shoulder surgery in the near future.  Lt IR is limited by 5 inches vs the Rt.  Pt is tolerating advancement of strength  exercises in the clnic.  Pt is limited with endurance tasks and is limited to lifting >5# per MD at this time.  PT will contine to benefit from skilled PT for Lt shoulder strength, endurance and flexibility to improve use of Lt UE for endurance tasks and reduce pain.     Rehab Potential Good   Clinical Impairments Affecting Rehab Potential 11/2014 Lt shoulder surgery of partail tear and distal clavicle excision   PT Frequency 2x / week   PT Duration 6 weeks   PT Treatment/Interventions ADLs/Self Care Home Management;Cryotherapy;Electrical Stimulation;Iontophoresis 4mg /ml Dexamethasone;Moist Heat;Therapeutic exercise;Therapeutic activities;Functional mobility training;Ultrasound;Neuromuscular re-education;Patient/family education;Manual techniques;Taping;Dry needling;Passive range of motion   PT Next Visit Plan Continue with shoulder progressive ROM, advance strength as tolerated;  modalities as needed      Patient will benefit from skilled therapeutic intervention in order to improve the following deficits and impairments:  Postural dysfunction, Decreased strength, Impaired flexibility, Pain, Impaired UE functional use, Decreased activity tolerance, Decreased range of motion  Visit Diagnosis: Shoulder stiffness, left  Muscle weakness (generalized)  Pain in left shoulder  Abnormal posture     Problem List Patient Active Problem List   Diagnosis Date Noted  . Insomnia 10/29/2014  . Plantar fasciitis 12/02/2013  . Benign essential HTN 05/28/2013  . Hepatic steatosis 04/12/2011  . Back pain 06/16/2010  . HEMORRHOIDS, EXTERNAL 12/21/2009  . DEPRESSION, MAJOR, MODERATE 04/04/2007  . HAIR LOSS 08/23/2006  . OBESITY NOS 05/28/2006  . FATIGUE 05/28/2006  . GLUCOSE INTOLERANCE 05/23/2006  . Migraine 05/23/2006   [name], PT 10/17/15 11:43 AM  [facility] [address] [place], [place], [address] Phone: [phone]    Fax:  [phone]  Name: Hailey Hopkins MRN: [id number] Date of Birth: 04-01-1975

## 2015-10-19 ENCOUNTER — Encounter: Payer: Self-pay | Admitting: Physical Therapy

## 2015-10-24 ENCOUNTER — Ambulatory Visit: Payer: PRIVATE HEALTH INSURANCE | Admitting: Physical Therapy

## 2015-10-26 ENCOUNTER — Ambulatory Visit: Payer: PRIVATE HEALTH INSURANCE | Attending: Surgical | Admitting: Physical Therapy

## 2015-10-26 ENCOUNTER — Encounter: Payer: Self-pay | Admitting: Physical Therapy

## 2015-10-26 DIAGNOSIS — M25612 Stiffness of left shoulder, not elsewhere classified: Secondary | ICD-10-CM | POA: Insufficient documentation

## 2015-10-26 DIAGNOSIS — M25512 Pain in left shoulder: Secondary | ICD-10-CM | POA: Insufficient documentation

## 2015-10-26 DIAGNOSIS — G8929 Other chronic pain: Secondary | ICD-10-CM | POA: Insufficient documentation

## 2015-10-26 DIAGNOSIS — R293 Abnormal posture: Secondary | ICD-10-CM | POA: Insufficient documentation

## 2015-10-26 DIAGNOSIS — M6281 Muscle weakness (generalized): Secondary | ICD-10-CM | POA: Insufficient documentation

## 2015-10-26 NOTE — Therapy (Signed)
[facility] [address] [place], [place], [address] Phone: [phone]   Fax:  [phone]  Physical Therapy Treatment  Patient Details  Name: Hailey Hopkins MRN: [id number] Date of Birth: [birth year] Referring Provider: Valma Cava, MD  Encounter Date: 10/26/2015      PT End of Session - 10/26/15 1016    Visit Number 27   Number of Visits 32   Date for PT Re-Evaluation 11/09/15   Authorization Type Workers Comp   Authorization - Visit Number 27   Authorization - Number of Visits 32   PT Start Time 1013  Pt limited by pain but completed all  her exercises.    PT Stop Time 1040   PT Time Calculation (min) 27 min   Activity Tolerance Patient tolerated treatment well;Patient limited by fatigue  Pt with sinus infection, hard to breathe.   Behavior During Therapy [facility] for tasks assessed/performed      Past Medical History:  Diagnosis Date  . Depression   . Diverticular disease     Past Surgical History:  Procedure Laterality Date  . ABDOMINAL HYSTERECTOMY    . ROTATOR CUFF REPAIR      There were no vitals filed for this visit.      Subjective Assessment - 10/26/15 1014    Subjective Pt reports she is waiting on surgical date.    Currently in Pain? Yes   Pain Score 3    Pain Location Shoulder   Pain Orientation Left   Pain Descriptors / Indicators Sore   Aggravating Factors  using it   Pain Relieving Factors ice, meds   Multiple Pain Sites No                         [facility] Adult PT Treatment/Exercise - 10/26/15 0001      Shoulder Exercises: Seated   Flexion Strengthening;Left;Weights;15 reps   Flexion Weight (lbs) 2   Abduction Strengthening;Left;20 reps;Weights  also scaption x 20   ABduction Weight (lbs) 2     Shoulder Exercises: Sidelying   External Rotation Strengthening;Left;20 reps;Weights   External Rotation Weight (lbs) 2   ABduction Strengthening;Left;20  reps;Weights   ABduction Weight (lbs) 2     Shoulder Exercises: Pulleys   Flexion 3 minutes   ABduction 3 minutes   Other Pulley Exercises IR     Shoulder Exercises: ROM/Strengthening   UBE (Upper Arm Bike) Level 2 x 8 minutes (4/4), sitting on blue ball                  PT Short Term Goals - 09/02/15 1041      PT SHORT TERM GOAL #1   Title She will be independent with inital HEP    Status Achieved     PT SHORT TERM GOAL #2   Title report a 25% reduction in Lt shoulder pain with use at home and work   Status Achieved     PT SHORT TERM GOAL #3   Title demonstrate Lt shoulder AROM IR to L2 at midline to improve use with self-care   Status Achieved           PT Long Term Goals - 10/26/15 1017      PT LONG TERM GOAL #1   Title be independent in advanced HEP   Time 6   Period Weeks   Status On-going     PT LONG TERM GOAL #5   Title  able to lift to waist and carry 10 pounds or more to increase home and work participation as MD allows   Baseline able to do in clinic    Period Weeks   Status On-going  still under 5# lifting restriction               Plan - 10/26/15 1016    Clinical Impression Statement Pt awaiting her surgical date. Still limited with 5# restriction for lifting. Functionally pt reports she is the same.    Rehab Potential Good   Clinical Impairments Affecting Rehab Potential 11/2014 Lt shoulder surgery of partail tear and distal clavicle excision   PT Frequency 2x / week   PT Duration 6 weeks   PT Treatment/Interventions ADLs/Self Care Home Management;Cryotherapy;Electrical Stimulation;Iontophoresis /ml Dexamethasone;Moist Heat;Therapeutic exercise;Therapeutic activities;Functional mobility training;Ultrasound;Neuromuscular re-education;Patient/family education;Manual techniques;Taping;Dry needling;Passive range of motion   PT Next Visit Plan Continue with shoulder progressive ROM, advance strength as tolerated;  modalities as  needed   Consulted and Agree with Plan of Care Patient      Patient will benefit from skilled therapeutic intervention in order to improve the following deficits and impairments:  Postural dysfunction, Decreased strength, Impaired flexibility, Pain, Impaired UE functional use, Decreased activity tolerance, Decreased range of motion  Visit Diagnosis: Shoulder stiffness, left  Muscle weakness (generalized)  Pain in left shoulder  Abnormal posture     Problem List Patient Active Problem List   Diagnosis Date Noted  . Insomnia 10/29/2014  . Plantar fasciitis 12/02/2013  . Benign essential HTN 05/28/2013  . Hepatic steatosis 04/12/2011  . Back pain 06/16/2010  . HEMORRHOIDS, EXTERNAL 12/21/2009  . DEPRESSION, MAJOR, MODERATE 04/04/2007  . HAIR LOSS 08/23/2006  . OBESITY NOS 05/28/2006  . FATIGUE 05/28/2006  . GLUCOSE INTOLERANCE 05/23/2006  . Migraine 05/23/2006    Theodoro Koval, PTA 10/26/2015, 10:42 AM  [facility] [address], [address] [place], [place], [address] Phone: [phone]   Fax:  [phone]  Name: Hailey Hopkins MRN: [id number] Date of Birth: [birth year]

## 2015-10-31 ENCOUNTER — Ambulatory Visit: Payer: PRIVATE HEALTH INSURANCE

## 2015-10-31 DIAGNOSIS — M25512 Pain in left shoulder: Secondary | ICD-10-CM

## 2015-10-31 DIAGNOSIS — M25612 Stiffness of left shoulder, not elsewhere classified: Secondary | ICD-10-CM

## 2015-10-31 DIAGNOSIS — M6281 Muscle weakness (generalized): Secondary | ICD-10-CM

## 2015-10-31 DIAGNOSIS — R293 Abnormal posture: Secondary | ICD-10-CM

## 2015-10-31 NOTE — Therapy (Signed)
[facility] [address], [address] [place], [place], [address] Phone: [phone]   Fax:  [phone]  Physical Therapy Treatment  Patient Details  Name: Hailey ButteryFelicia G Spohr MRN: [id number] Date of Birth: June 04, 1975 Referring Provider: Valma Cavaollins, Andrew, MD  Encounter Date: 10/31/2015      PT End of Session - 10/31/15 1007    Visit Number 28   Number of Visits 32   Date for PT Re-Evaluation 11/09/15   Authorization Type Workers Comp   Authorization - Visit Number 28   Authorization - Number of Visits 32   PT Start Time [phone]   PT Stop Time 1023   PT Time Calculation (min) 52 min   Activity Tolerance Patient tolerated treatment well   [facility] assessed/performed      Past Medical History:  Diagnosis Date  . Depression   . Diverticular disease     Past Surgical History:  Procedure Laterality Date  . ABDOMINAL HYSTERECTOMY    . ROTATOR CUFF REPAIR      There were no vitals filed for this visit.      Subjective Assessment - 10/31/15 0935    Subjective Surgery scheduled for 11/10/15- Distal clavical resection and debridement?   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  Lt shoulder surgery 12/14/2014 for partial rotator cuff repair and distal clavicle excision. March 15th injection into left shoulder under Ultrasound to break up scar tissue   Currently in Pain? Yes   Pain Score 3    Pain Location Shoulder   Pain Orientation Left   Pain Descriptors / Indicators Sore   Pain Type Chronic pain   Pain Onset More than a month ago   Pain Frequency Constant   Aggravating Factors  use   Pain Relieving Factors ice, meds                         [facility] Adult PT Treatment/Exercise - 10/31/15 0001      Shoulder Exercises: Seated   Flexion Strengthening;Left;Weights;15 reps   Flexion Weight (lbs) 2   Abduction Strengthening;Left;20 reps;Weights  also scaption x  20   ABduction Weight (lbs) 2     Shoulder Exercises: Sidelying   External Rotation Strengthening;Left;20 reps;Weights   External Rotation Weight (lbs) 2   ABduction Strengthening;Left;20 reps;Weights   ABduction Weight (lbs) 2     Shoulder Exercises: Standing   Other Standing Exercises wall clock x 10     Shoulder Exercises: Pulleys   Flexion 3 minutes   Other Pulley Exercises 3min IR     Shoulder Exercises: ROM/Strengthening   UBE (Upper Arm Bike) Level 2 x 8 minutes (4/4), sitting on blue ball     Moist Heat Therapy   Number Minutes Moist Heat 10 Minutes   Moist Heat Location Shoulder                  PT Short Term Goals - 09/02/15 1041      PT SHORT TERM GOAL #1   Title She will be independent with inital HEP    Status Achieved     PT SHORT TERM GOAL #2   Title report a 25% reduction in Lt shoulder pain with use at home and work   Status Achieved     PT SHORT TERM GOAL #3   Title demonstrate Lt shoulder AROM IR to L2 at midline to improve use with self-care   Status Achieved  PT Long Term Goals - 10/31/15 0940      PT LONG TERM GOAL #1   Title be independent in advanced HEP   Time 6   Period Weeks   Status On-going     PT LONG TERM GOAL #2   Title improve FOTO to < or = to 46% limitation   Period Weeks   Status On-going     PT LONG TERM GOAL #3   Title report a 50% reduction in Lt shoulder pain with home tasks   Status Achieved     PT LONG TERM GOAL #5   Title able to lift to waist and carry 10 pounds or more to increase home and work participation as MD allows   Time 6   Period Weeks   Status On-going               Plan - 10/31/15 0942    Clinical Impression Statement Pt with consistent 3/10 Lt shoulder pain.  She is stil limited to 5# lifting per MD.  Pt will have surgery 11/10/15 for debridement and distal clavicle resection.  PT will continue to build strength and flexibility of Lt shoulder to improve functional  use.   Rehab Potential Good   Clinical Impairments Affecting Rehab Potential 11/2014 Lt shoulder surgery of partail tear and distal clavicle excision   PT Frequency 2x / week   PT Duration 6 weeks   PT Treatment/Interventions ADLs/Self Care Home Management;Cryotherapy;Electrical Stimulation;Iontophoresis /ml Dexamethasone;Moist Heat;Therapeutic exercise;Therapeutic activities;Functional mobility training;Ultrasound;Neuromuscular re-education;Patient/family education;Manual techniques;Taping;Dry needling;Passive range of motion   PT Next Visit Plan Continue with shoulder progressive ROM, advance strength as tolerated;  modalities as needed   Consulted and Agree with Plan of Care Patient      Patient will benefit from skilled therapeutic intervention in order to improve the following deficits and impairments:  Postural dysfunction, Decreased strength, Impaired flexibility, Pain, Impaired UE functional use, Decreased activity tolerance, Decreased range of motion  Visit Diagnosis: Shoulder stiffness, left  Muscle weakness (generalized)  Pain in left shoulder  Abnormal posture     Problem List Patient Active Problem List   Diagnosis Date Noted  . Insomnia 10/29/2014  . Plantar fasciitis 12/02/2013  . Benign essential HTN 05/28/2013  . Hepatic steatosis 04/12/2011  . Back pain 06/16/2010  . HEMORRHOIDS, EXTERNAL 12/21/2009  . DEPRESSION, MAJOR, MODERATE 04/04/2007  . HAIR LOSS 08/23/2006  . OBESITY NOS 05/28/2006  . FATIGUE 05/28/2006  . GLUCOSE INTOLERANCE 05/23/2006  . Migraine 05/23/2006     Hailey Hopkins, PT 10/31/15 10:10 AM  [facility] [address] [place], [place], [address] Phone: [phone]   Fax:  [phone]  Name: Hailey Hopkins MRN: [id number] Date of Birth: 12-10-1975

## 2015-11-07 ENCOUNTER — Ambulatory Visit: Payer: PRIVATE HEALTH INSURANCE

## 2015-11-07 DIAGNOSIS — M25612 Stiffness of left shoulder, not elsewhere classified: Secondary | ICD-10-CM

## 2015-11-07 DIAGNOSIS — M6281 Muscle weakness (generalized): Secondary | ICD-10-CM

## 2015-11-07 DIAGNOSIS — M25512 Pain in left shoulder: Secondary | ICD-10-CM

## 2015-11-07 DIAGNOSIS — R293 Abnormal posture: Secondary | ICD-10-CM

## 2015-11-07 NOTE — Therapy (Signed)
[facility] [address], [address] [place], [place], [address] Phone: [phone]   Fax:  [phone]  Physical Therapy Treatment  Patient Details  Name: Hailey Hopkins MRN: [id number] Date of Birth: [birth year] Referring Provider: Valma Cavaollins, Andrew, MD  Encounter Date: 11/07/2015      PT End of Session - 11/07/15 0955    Visit Number 29   Number of Visits 32   Date for PT Re-Evaluation 11/09/15   Authorization Type Workers Comp   Authorization - Visit Number 29   Authorization - Number of Visits 32   PT Start Time [phone]   PT Stop Time 0957  exercises complete in 30 minutes today   PT Time Calculation (min) 30 min   Activity Tolerance Patient tolerated treatment well   Behavior During Therapy [facility] for tasks assessed/performed      Past Medical History:  Diagnosis Date  . Depression   . Diverticular disease     Past Surgical History:  Procedure Laterality Date  . ABDOMINAL HYSTERECTOMY    . ROTATOR CUFF REPAIR      There were no vitals filed for this visit.      Subjective Assessment - 11/07/15 0929    Subjective Surgery scheduled for 11/10/15- Distal clavical resection and debridement?   Currently in Pain? Yes   Pain Score 2    Pain Location Shoulder  clavicle   Pain Orientation Left   Pain Type Chronic pain   Pain Onset More than a month ago   Pain Frequency Constant   Aggravating Factors  use,    Pain Relieving Factors ice, meds                         [facility] Adult PT Treatment/Exercise - 11/07/15 0001      Shoulder Exercises: Seated   Flexion Strengthening;Left;Weights;15 reps   Flexion Weight (lbs) 2   Abduction Strengthening;Left;20 reps;Weights  also scaption x 20   ABduction Weight (lbs) 2     Shoulder Exercises: Sidelying   External Rotation Strengthening;Left;20 reps;Weights   External Rotation Weight (lbs) 2   ABduction Strengthening;Left;20 reps;Weights   ABduction  Weight (lbs) 2     Shoulder Exercises: Pulleys   Flexion 3 minutes   ABduction 3 minutes   Other Pulley Exercises 3min IR     Shoulder Exercises: ROM/Strengthening   UBE (Upper Arm Bike) Level 2 x 8 minutes (4/4), sitting on blue ball                  PT Short Term Goals - 09/02/15 1041      PT SHORT TERM GOAL #1   Title She will be independent with inital HEP    Status Achieved     PT SHORT TERM GOAL #2   Title report a 25% reduction in Lt shoulder pain with use at home and work   Status Achieved     PT SHORT TERM GOAL #3   Title demonstrate Lt shoulder AROM IR to L2 at midline to improve use with self-care   Status Achieved           PT Long Term Goals - 11/07/15 0931      PT LONG TERM GOAL #1   Title be independent in advanced HEP   Time 6   Period Weeks   Status On-going     PT LONG TERM GOAL #2   Title improve FOTO to < or = to  46% limitation   Time 6   Period Weeks   Status On-going     PT LONG TERM GOAL #5   Title able to lift to waist and carry 10 pounds or more to increase home and work participation as MD allows   Time 6   Period Weeks   Status On-going               Plan - 11/07/15 0933    Clinical Impression Statement Pt with continued Lt shoulder pain and pt rates the pain as 2/10 today.  Pt is scheduled for surgery 11/10/15 for debridement and distal clavicle resection.  PT will continue to build strength and flexibility of Lt shoulder to improve functional use.  Pt will be reassessed after surgery.   Rehab Potential Good   Clinical Impairments Affecting Rehab Potential 11/2014 Lt shoulder surgery of partail tear and distal clavicle excision   PT Frequency 2x / week   PT Duration 6 weeks   PT Treatment/Interventions ADLs/Self Care Home Management;Cryotherapy;Electrical Stimulation;Iontophoresis 4mg /ml Dexamethasone;Moist Heat;Therapeutic exercise;Therapeutic activities;Functional mobility training;Ultrasound;Neuromuscular  re-education;Patient/family education;Manual techniques;Taping;Dry needling;Passive range of motion   PT Next Visit Plan Continue with shoulder progressive ROM, advance strength as tolerated;  modalities as needed   Consulted and Agree with Plan of Care Patient      Patient will benefit from skilled therapeutic intervention in order to improve the following deficits and impairments:  Postural dysfunction, Decreased strength, Impaired flexibility, Pain, Impaired UE functional use, Decreased activity tolerance, Decreased range of motion  Visit Diagnosis: Shoulder stiffness, left  Muscle weakness (generalized)  Pain in left shoulder  Abnormal posture     Problem List Patient Active Problem List   Diagnosis Date Noted  . Insomnia 10/29/2014  . Plantar fasciitis 12/02/2013  . Benign essential HTN 05/28/2013  . Hepatic steatosis 04/12/2011  . Back pain 06/16/2010  . HEMORRHOIDS, EXTERNAL 12/21/2009  . DEPRESSION, MAJOR, MODERATE 04/04/2007  . HAIR LOSS 08/23/2006  . OBESITY NOS 05/28/2006  . FATIGUE 05/28/2006  . GLUCOSE INTOLERANCE 05/23/2006  . Migraine 05/23/2006     Lorrene ReidKelly Takacs, PT 11/07/15 9:58 AM  [facility] [address], [address] [place], [place], [address] Phone: [phone]   Fax:  [phone]  Name: Hailey Hopkins MRN: [id number] Date of Birth: 09-Aug-1975

## 2015-11-09 ENCOUNTER — Ambulatory Visit: Payer: PRIVATE HEALTH INSURANCE

## 2015-11-09 DIAGNOSIS — M25612 Stiffness of left shoulder, not elsewhere classified: Secondary | ICD-10-CM

## 2015-11-09 DIAGNOSIS — R293 Abnormal posture: Secondary | ICD-10-CM

## 2015-11-09 DIAGNOSIS — M25512 Pain in left shoulder: Secondary | ICD-10-CM

## 2015-11-09 DIAGNOSIS — M6281 Muscle weakness (generalized): Secondary | ICD-10-CM

## 2015-11-09 NOTE — Therapy (Signed)
[facility] [address], [address] [place], [place], [address] Phone: [phone]   Fax:  [phone]  Physical Therapy Treatment  Patient Details  Name: Hailey Hopkins MRN: [id number] Date of Birth: 25-Sep-1975 Referring Provider: Valma Cavaollins, Andrew, MD  Encounter Date: 11/09/2015      PT End of Session - 11/09/15 0954    Visit Number 30   Number of Visits 32   Date for PT Re-Evaluation 11/09/15   Authorization Type Workers Comp   Authorization - Visit Number 30   Authorization - Number of Visits 32   PT Start Time 0933   PT Stop Time 1013   PT Time Calculation (min) 40 min   Activity Tolerance Patient tolerated treatment well   Behavior During Therapy [facility] for tasks assessed/performed      Past Medical History:  Diagnosis Date  . Depression   . Diverticular disease     Past Surgical History:  Procedure Laterality Date  . ABDOMINAL HYSTERECTOMY    . ROTATOR CUFF REPAIR      There were no vitals filed for this visit.      Subjective Assessment - 11/09/15 0939    Subjective Surgery scheduled for 11/10/15- Distal clavical resection and debridement?  Pt will come to PT the next day for re-evaluation.   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  Lt shoulder surgery 12/14/2014 for partial rotator cuff repair and distal clavicle excision. March 15th injection into left shoulder under Ultrasound to break up scar tissue   Currently in Pain? Yes   Pain Score 4    Pain Location Shoulder   Pain Orientation Left   Pain Descriptors / Indicators Sore   Pain Type Chronic pain   Pain Onset More than a month ago   Pain Frequency Constant   Aggravating Factors  use   Pain Relieving Factors ice, medication, rest                         [facility] Adult PT Treatment/Exercise - 11/09/15 0001      Shoulder Exercises: Seated   Flexion Strengthening;Left;Weights;15 reps   Flexion Weight (lbs) 2   Abduction Strengthening;Left;20 reps;Weights  also scaption x 20   ABduction Weight (lbs) 2     Shoulder Exercises: Sidelying   External Rotation Strengthening;Left;20 reps;Weights   External Rotation Weight (lbs) 2   ABduction Strengthening;Left;20 reps;Weights   ABduction Weight (lbs) 2     Shoulder Exercises: Pulleys   Flexion 3 minutes   ABduction 3 minutes   Other Pulley Exercises 3min IR     Shoulder Exercises: ROM/Strengthening   UBE (Upper Arm Bike) Level 2 x 8 minutes (4/4), sitting on blue ball     Moist Heat Therapy   Number Minutes Moist Heat 10 Minutes   Moist Heat Location Shoulder                  PT Short Term Goals - 09/02/15 1041      PT SHORT TERM GOAL #1   Title She will be independent with inital HEP    Status Achieved     PT SHORT TERM GOAL #2   Title report a 25% reduction in Lt shoulder pain with use at home and work   Status Achieved     PT SHORT TERM GOAL #3   Title demonstrate Lt shoulder AROM IR to L2 at midline to improve use with self-care   Status Achieved  PT Long Term Goals - 11/07/15 0931      PT LONG TERM GOAL #1   Title be independent in advanced HEP   Time 6   Period Weeks   Status On-going     PT LONG TERM GOAL #2   Title improve FOTO to < or = to 46% limitation   Time 6   Period Weeks   Status On-going     PT LONG TERM GOAL #5   Title able to lift to waist and carry 10 pounds or more to increase home and work participation as MD allows   Time 6   Period Weeks   Status On-going               Plan - 11/09/15 0941    Clinical Impression Statement Pt with continued Lt shoulder pain and pt rates the pain as 2-4/10 on average.  Pt is having surgery tomorrow and will come to PT the next day of assessment.  PT is working to build strength and flexiblity to improve functional use.     Rehab Potential Good   Clinical Impairments Affecting Rehab Potential 11/2014 Lt shoulder surgery of partail  tear and distal clavicle excision   PT Frequency 2x / week   PT Duration 6 weeks   PT Treatment/Interventions ADLs/Self Care Home Management;Cryotherapy;Electrical Stimulation;Iontophoresis 4mg /ml Dexamethasone;Moist Heat;Therapeutic exercise;Therapeutic activities;Functional mobility training;Ultrasound;Neuromuscular re-education;Patient/family education;Manual techniques;Taping;Dry needling;Passive range of motion   PT Next Visit Plan Renewal next session after surgery.  Request more workers comp visits.     Consulted and Agree with Plan of Care Patient      Patient will benefit from skilled therapeutic intervention in order to improve the following deficits and impairments:  Postural dysfunction, Decreased strength, Impaired flexibility, Pain, Impaired UE functional use, Decreased activity tolerance, Decreased range of motion  Visit Diagnosis: Shoulder stiffness, left  Muscle weakness (generalized)  Pain in left shoulder  Abnormal posture     Problem List Patient Active Problem List   Diagnosis Date Noted  . Insomnia 10/29/2014  . Plantar fasciitis 12/02/2013  . Benign essential HTN 05/28/2013  . Hepatic steatosis 04/12/2011  . Back pain 06/16/2010  . HEMORRHOIDS, EXTERNAL 12/21/2009  . DEPRESSION, MAJOR, MODERATE 04/04/2007  . HAIR LOSS 08/23/2006  . OBESITY NOS 05/28/2006  . FATIGUE 05/28/2006  . GLUCOSE INTOLERANCE 05/23/2006  . Migraine 05/23/2006     Lorrene ReidKelly Tiearra Colwell, PT 11/09/15 9:56 AM  [facility] [address], [address] [place], [place], [address] Phone: [phone]   Fax:  [phone]  Name: Hailey Hopkins MRN: [id number] Date of Birth: 18-Aug-1975

## 2015-11-10 MED FILL — OXYCODONE/APAP 5-325: 5-325 | 10 days supply | Qty: 60 | Fill #0

## 2015-11-10 MED FILL — METHOCARBAMOL 500 MG TABLET: 500 | 17 days supply | Qty: 50 | Fill #0

## 2015-11-10 MED FILL — DOXYCYCLINE HYCLATE 100 MG: 100 | 4 days supply | Qty: 8 | Fill #0

## 2015-11-11 ENCOUNTER — Ambulatory Visit: Payer: PRIVATE HEALTH INSURANCE | Admitting: Physical Therapy

## 2015-11-11 DIAGNOSIS — M25612 Stiffness of left shoulder, not elsewhere classified: Secondary | ICD-10-CM

## 2015-11-11 DIAGNOSIS — M25512 Pain in left shoulder: Secondary | ICD-10-CM

## 2015-11-11 DIAGNOSIS — M6281 Muscle weakness (generalized): Secondary | ICD-10-CM

## 2015-11-11 NOTE — Therapy (Signed)
[facility] [address] [place], [place], [address] Phone: [phone]   Fax:  [phone]  Physical Therapy Treatment/Recertification  Patient Details  Name: Hailey Hopkins MRN: [id number] Date of Birth: 07/31/1975 Referring Provider: Valma Cava, MD  Encounter Date: 11/11/2015      PT End of Session - 11/11/15 1155    Visit Number 31   Date for PT Re-Evaluation 01/06/16   Authorization Type Workers Comp   PT Start Time 1015   PT Stop Time 1115   PT Time Calculation (min) 60 min   Activity Tolerance Patient tolerated treatment well      Past Medical History:  Diagnosis Date  . Depression   . Diverticular disease     Past Surgical History:  Procedure Laterality Date  . ABDOMINAL HYSTERECTOMY    . ROTATOR CUFF REPAIR      There were no vitals filed for this visit.      Subjective Assessment - 11/11/15 1026    Subjective Patient had arthroscopic SAD/DCR yesterday 11/10/15.  Patient reports lots of cartilage and scar tissue.  Has follow up next week.  Has sling but left in the car.  Guarded movement.  The doctor wanted me to come to PT 5 days next week.      Currently in Pain? Yes   Pain Score 7    Pain Location Shoulder   Pain Orientation Left   Pain Type Surgical pain   Pain Frequency Constant   Aggravating Factors  use   Pain Relieving Factors pain medication            [facility] PT Assessment - 11/11/15 0001      Observation/Other Assessments   Focus on Therapeutic Outcomes (FOTO)  62% limitation     AROM   Left Shoulder Flexion 35 Degrees  assisted "still numb"   Left Shoulder ABduction 0 Degrees   Left Shoulder Internal Rotation 45 Degrees   Left Shoulder External Rotation 5 Degrees     PROM   Overall PROM  --  shoulder flexion 90, abduction 80, er 40     Strength   Left Shoulder Flexion 1/5   Left Shoulder ABduction 1/5   Left Shoulder Internal Rotation 1/5   Left  Shoulder External Rotation 1/5                     [facility] Adult PT Treatment/Exercise - 11/11/15 0001      Electrical Stimulation   Electrical Stimulation Location left shoulder  with PROM   Electrical Stimulation Action IFC   Electrical Stimulation Parameters 15 ma   Electrical Stimulation Goals Pain     Vasopneumatic   Number Minutes Vasopneumatic  15 minutes   Vasopnuematic Location  Shoulder   Vasopneumatic Pressure Low   Vasopneumatic Temperature  32 degrees     Manual Therapy   Manual therapy comments PROM left shoulder 15x each direction within pain limits                  PT Short Term Goals - 11/11/15 1205      PT SHORT TERM GOAL #1   Title She will be independent with inital HEP    Status Achieved     PT SHORT TERM GOAL #2   Title report a 25% reduction in Lt shoulder pain with use at home and work   Status Achieved     PT SHORT TERM GOAL #3   Title demonstrate  Lt shoulder AROM flexion to 145 to improve use with self-care and overhead reaching   Time 4   Period Weeks   Status New           PT Long Term Goals - 11/11/15 1206      PT LONG TERM GOAL #1   Title be independent in advanced HEP   Time 8   Period Weeks   Status On-going     PT LONG TERM GOAL #2   Title improve FOTO to < or = to 46% limitation   Time 8   Period Weeks   Status On-going     PT LONG TERM GOAL #3   Title report a 60% reduction in Lt shoulder pain with home tasks   Time 8   Period Weeks   Status Revised     PT LONG TERM GOAL #4   Title demonstrate 4+/5 Lt shoulder strength throughout to improve endurance for Lt UE use   Time 8   Period Weeks   Status Revised     PT LONG TERM GOAL #5   Title able to lift to waist and carry 10 pounds or more to increase home and work participation as MD allows   Time 8   Period Weeks   Status On-going     PT LONG TERM GOAL #6   Title demonstrate Lt shoulder IR to lacking < or = to 2 inches vs the Rt to  improve self-care/dressing   Time 8   Period Weeks   Status On-going               Plan - 11/11/15 1156    Clinical Impression Statement Patient returns after arthroscopic surgery yesterday for subacromial decompression and debridement (11/10/15).  She reports the nerve block is gradually wearing off but she has minimal active shoulder movement yet.  Passive left shoulder flexion to 90 degrees, abduction to 80, ER to 45.   Moderate edema in fingers/forearm as well as anterior and superior shoulder.  Bandaids covering scope incisions.  She reports the doctor wants her to come 5 days/week next week to PT.     Rehab Potential Good   Clinical Impairments Affecting Rehab Potential 11/10/15 left arthroscopic SAD/DCR   PT Frequency 5x / week  then decrease to 2-3xweek   PT Duration 8 weeks   PT Treatment/Interventions ADLs/Self Care Home Management;Cryotherapy;Electrical Stimulation;Iontophoresis 4mg /ml Dexamethasone;Moist Heat;Therapeutic exercise;Therapeutic activities;Functional mobility training;Ultrasound;Neuromuscular re-education;Patient/family education;Manual techniques;Taping;Dry needling;Passive range of motion;Vasopneumatic Device   PT Next Visit Plan PROM; AAROM; isometrics, scapular mobility;  e-stim for pain control;  vasocompression for edema management       Patient will benefit from skilled therapeutic intervention in order to improve the following deficits and impairments:     Visit Diagnosis: Shoulder stiffness, left - Plan: PT plan of care cert/re-cert  Muscle weakness (generalized) - Plan: PT plan of care cert/re-cert  Pain in left shoulder - Plan: PT plan of care cert/re-cert     Problem List Patient Active Problem List   Diagnosis Date Noted  . Insomnia 10/29/2014  . Plantar fasciitis 12/02/2013  . Benign essential HTN 05/28/2013  . Hepatic steatosis 04/12/2011  . Back pain 06/16/2010  . HEMORRHOIDS, EXTERNAL 12/21/2009  . DEPRESSION, MAJOR, MODERATE  04/04/2007  . HAIR LOSS 08/23/2006  . OBESITY NOS 05/28/2006  . FATIGUE 05/28/2006  . GLUCOSE INTOLERANCE 05/23/2006  . Migraine 05/23/2006   Lavinia SharpsStacy Borna Wessinger, PT 11/11/15 12:12 PM Phone: [phone] Fax: [phone]  [name], Letishia Elliott C 11/11/2015,  12:11 PM  [facility] [address], [address] [place], [place], [address] Phone: [phone]   Fax:  [phone]  Name: Hailey Hopkins MRN: [id number] Date of Birth: Mar 26, 1976

## 2015-11-14 ENCOUNTER — Ambulatory Visit: Payer: PRIVATE HEALTH INSURANCE | Admitting: Physical Therapy

## 2015-11-14 DIAGNOSIS — M25512 Pain in left shoulder: Secondary | ICD-10-CM

## 2015-11-14 DIAGNOSIS — M25612 Stiffness of left shoulder, not elsewhere classified: Secondary | ICD-10-CM | POA: Diagnosis not present

## 2015-11-14 DIAGNOSIS — M6281 Muscle weakness (generalized): Secondary | ICD-10-CM

## 2015-11-14 NOTE — Therapy (Signed)
[facility] [address], [address] [place], [place], [address] Phone: [phone]   Fax:  [phone]  Physical Therapy Treatment  Patient Details  Name: Hailey Hopkins MRN: [id number] Date of Birth: 07-15-1975 Referring Provider: Valma Cavaollins, Andrew, MD  Encounter Date: 11/14/2015      PT End of Session - 11/14/15 0851    Visit Number 32   Number of Visits 32   Date for PT Re-Evaluation 01/06/16   Authorization Type Workers Comp   Authorization - Visit Number 32   PT Start Time 0847   PT Stop Time 0945   PT Time Calculation (min) 58 min   Activity Tolerance Patient tolerated treatment well   Behavior During Therapy [facility] for tasks assessed/performed      Past Medical History:  Diagnosis Date  . Depression   . Diverticular disease     Past Surgical History:  Procedure Laterality Date  . ABDOMINAL HYSTERECTOMY    . ROTATOR CUFF REPAIR      There were no vitals filed for this visit.      Subjective Assessment - 11/14/15 0849    Subjective Was pretty sore after last visit, this morning shoulder is quite swollen and stiff.   Currently in Pain? Yes   Pain Score 7    Pain Location Shoulder   Pain Orientation Left   Pain Descriptors / Indicators Sore;Tightness;Aching   Aggravating Factors  Moving it   Pain Relieving Factors Pain meds   Multiple Pain Sites No                         [facility] Adult PT Treatment/Exercise - 11/14/15 0001      Shoulder Exercises: Standing   Flexion --  Finger ladder 10x reaching 15     Shoulder Exercises: Pulleys   Flexion 3 minutes   ABduction 3 minutes     Shoulder Exercises: ROM/Strengthening   UBE (Upper Arm Bike) L1 x 3 min     Shoulder Exercises: Stretch   Other Shoulder Stretches Cane behind the back 10x  VC for posture and to not move from Geneticist, molecularwrists     Electrical Stimulation   Electrical Stimulation Location left shoulder  with PROM   Electrical  Stimulation Action IFC   Electrical Stimulation Parameters 15 min   Electrical Stimulation Goals Pain     Vasopneumatic   Number Minutes Vasopneumatic  15 minutes   Vasopnuematic Location  Shoulder   Vasopneumatic Pressure Medium   Vasopneumatic Temperature  3 flakes     Manual Therapy   Manual therapy comments PROM all planes to tolerance                  PT Short Term Goals - 11/11/15 1205      PT SHORT TERM GOAL #1   Title She will be independent with inital HEP    Status Achieved     PT SHORT TERM GOAL #2   Title report a 25% reduction in Lt shoulder pain with use at home and work   Status Achieved     PT SHORT TERM GOAL #3   Title demonstrate Lt shoulder AROM flexion to 145 to improve use with self-care and overhead reaching   Time 4   Period Weeks   Status New           PT Long Term Goals - 11/11/15 1206      PT LONG TERM GOAL #1  Title be independent in advanced HEP   Time 8   Period Weeks   Status On-going     PT LONG TERM GOAL #2   Title improve FOTO to < or = to 46% limitation   Time 8   Period Weeks   Status On-going     PT LONG TERM GOAL #3   Title report a 60% reduction in Lt shoulder pain with home tasks   Time 8   Period Weeks   Status Revised     PT LONG TERM GOAL #4   Title demonstrate 4+/5 Lt shoulder strength throughout to improve endurance for Lt UE use   Time 8   Period Weeks   Status Revised     PT LONG TERM GOAL #5   Title able to lift to waist and carry 10 pounds or more to increase home and work participation as MD allows   Time 8   Period Weeks   Status On-going     PT LONG TERM GOAL #6   Title demonstrate Lt shoulder IR to lacking < or = to 2 inches vs the Rt to improve self-care/dressing   Time 8   Period Weeks   Status On-going               Plan - 11/14/15 [id number]    Clinical Impression Statement Pt continues with soreness post surgery. During PROM today pt had increased motion via eyeballing, no  formal measurement, but obvious enough.  Pt was able to tolerate AAROM exercises today with minimal pain increases.  Vasonumatic helps with pain & edema reduction.     Rehab Potential Good   Clinical Impairments Affecting Rehab Potential 11/10/15 left arthroscopic SAD/DCR   PT Frequency 5x / week   PT Duration 8 weeks   PT Next Visit Plan PROM; AAROM; isometrics, scapular mobility;  e-stim for pain control;  vasocompression for edema management    Consulted and Agree with Plan of Care Patient      Patient will benefit from skilled therapeutic intervention in order to improve the following deficits and impairments:  Postural dysfunction, Decreased strength, Impaired flexibility, Pain, Impaired UE functional use, Decreased activity tolerance, Decreased range of motion  Visit Diagnosis: Shoulder stiffness, left  Muscle weakness (generalized)  Pain in left shoulder     Problem List Patient Active Problem List   Diagnosis Date Noted  . Insomnia 10/29/2014  . Plantar fasciitis 12/02/2013  . Benign essential HTN 05/28/2013  . Hepatic steatosis 04/12/2011  . Back pain 06/16/2010  . HEMORRHOIDS, EXTERNAL 12/21/2009  . DEPRESSION, MAJOR, MODERATE 04/04/2007  . HAIR LOSS 08/23/2006  . OBESITY NOS 05/28/2006  . FATIGUE 05/28/2006  . GLUCOSE INTOLERANCE 05/23/2006  . Migraine 05/23/2006    Raghav Verrilli, PTA 11/14/2015, 9:35 AM  [facility] [address], [address] [place], [place], [address] Phone: [phone]   Fax:  [phone]  Name: Hailey Hopkins MRN: [id number] Date of Birth: 1976-02-01

## 2015-11-15 ENCOUNTER — Ambulatory Visit: Payer: PRIVATE HEALTH INSURANCE

## 2015-11-15 DIAGNOSIS — M6281 Muscle weakness (generalized): Secondary | ICD-10-CM

## 2015-11-15 DIAGNOSIS — R293 Abnormal posture: Secondary | ICD-10-CM

## 2015-11-15 DIAGNOSIS — M25612 Stiffness of left shoulder, not elsewhere classified: Secondary | ICD-10-CM

## 2015-11-15 DIAGNOSIS — M25512 Pain in left shoulder: Secondary | ICD-10-CM

## 2015-11-15 NOTE — Therapy (Signed)
[facility] [address], [address] [place], [place], [address] Phone: [phone]   Fax:  [phone]  Physical Therapy Treatment  Patient Details  Name: Hailey ButteryFelicia G Bougher MRN: [id number] Date of Birth: [birth year] Referring Provider: Valma Cavaollins, Andrew, MD  Encounter Date: 11/15/2015      PT End of Session - 11/15/15 1309    Visit Number 33   Number of Visits 37   Date for PT Re-Evaluation 01/06/16   Authorization Type Workers Comp   Authorization - Visit Number 33   Authorization - Number of Visits 37   PT Start Time 1231   PT Stop Time 1325   PT Time Calculation (min) 54 min   Activity Tolerance Patient tolerated treatment well   Behavior During Therapy WFL for tasks assessed/performed      Past Medical History:  Diagnosis Date  . Depression   . Diverticular disease     Past Surgical History:  Procedure Laterality Date  . ABDOMINAL HYSTERECTOMY    . ROTATOR CUFF REPAIR      There were no vitals filed for this visit.      Subjective Assessment - 11/15/15 1241    Subjective Pt is really hurting today.  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  Lt shoulder surgery 12/14/2014 for partial rotator cuff repair and distal clavicle excision. March 15th injection into left shoulder under Ultrasound to break up scar tissue   Currently in Pain? Yes   Pain Score 9    Pain Location Shoulder   Pain Orientation Left   Pain Descriptors / Indicators Aching;Sore;Tightness   Pain Type Surgical pain                         [facility] Adult PT Treatment/Exercise - 11/15/15 0001      Shoulder Exercises: Standing   Flexion --  Finger ladder 20x reaching 15     Shoulder Exercises: Pulleys   Flexion 3 minutes   ABduction 3 minutes     Shoulder Exercises: ROM/Strengthening   UBE (Upper Arm Bike) L1 x 3 min     Shoulder Exercises: Stretch   Other Shoulder Stretches  Cane behind the back 10x  VC for posture and to not move from Geneticist, molecularwrists     Electrical Stimulation   Electrical Stimulation Location left shoulder  with PROM   Electrical Stimulation Action IFC   Electrical Stimulation Parameters 15 minutes   Electrical Stimulation Goals Pain     Vasopneumatic   Number Minutes Vasopneumatic  15 minutes   Vasopnuematic Location  Shoulder   Vasopneumatic Pressure Medium   Vasopneumatic Temperature  3 flakes     Manual Therapy   Manual therapy comments PROM all planes to tolerance                  PT Short Term Goals - 11/11/15 1205      PT SHORT TERM GOAL #1   Title She will be independent with inital HEP    Status Achieved     PT SHORT TERM GOAL #2   Title report a 25% reduction in Lt shoulder pain with use at home and work   Status Achieved     PT SHORT TERM GOAL #3   Title demonstrate Lt shoulder AROM flexion to 145 to improve use with self-care and overhead reaching   Time 4   Period Weeks   Status New  PT Long Term Goals - 11/11/15 1206      PT LONG TERM GOAL #1   Title be independent in advanced HEP   Time 8   Period Weeks   Status On-going     PT LONG TERM GOAL #2   Title improve FOTO to < or = to 46% limitation   Time 8   Period Weeks   Status On-going     PT LONG TERM GOAL #3   Title report a 60% reduction in Lt shoulder pain with home tasks   Time 8   Period Weeks   Status Revised     PT LONG TERM GOAL #4   Title demonstrate 4+/5 Lt shoulder strength throughout to improve endurance for Lt UE use   Time 8   Period Weeks   Status Revised     PT LONG TERM GOAL #5   Title able to lift to waist and carry 10 pounds or more to increase home and work participation as MD allows   Time 8   Period Weeks   Status On-going     PT LONG TERM GOAL #6   Title demonstrate Lt shoulder IR to lacking < or = to 2 inches vs the Rt to improve self-care/dressing   Time 8   Period Weeks   Status On-going                Plan - 11/15/15 1251    Clinical Impression Statement Pt with increased pain today and reports that she isn't sleeping well.  Pt able to perform low level flexiblity and gentle mobility exercises today. Vasopneumatice and e-stim helps with pain.  Pt will continue to benefit from skilled PT for Lt shoulder AROM, strength and modalities for pain management.     Rehab Potential Good   Clinical Impairments Affecting Rehab Potential 11/10/15 left arthroscopic SAD/DCR   PT Frequency 5x / week   PT Duration 8 weeks   PT Treatment/Interventions ADLs/Self Care Home Management;Cryotherapy;Electrical Stimulation;Iontophoresis 4mg /ml Dexamethasone;Moist Heat;Therapeutic exercise;Therapeutic activities;Functional mobility training;Ultrasound;Neuromuscular re-education;Patient/family education;Manual techniques;Taping;Dry needling;Passive range of motion;Vasopneumatic Device   PT Next Visit Plan PROM; AAROM; isometrics, scapular mobility;  e-stim for pain control;  vasocompression for edema management    Consulted and Agree with Plan of Care Patient      Patient will benefit from skilled therapeutic intervention in order to improve the following deficits and impairments:  Postural dysfunction, Decreased strength, Impaired flexibility, Pain, Impaired UE functional use, Decreased activity tolerance, Decreased range of motion  Visit Diagnosis: Shoulder stiffness, left  Muscle weakness (generalized)  Pain in left shoulder  Abnormal posture     Problem List Patient Active Problem List   Diagnosis Date Noted  . Insomnia 10/29/2014  . Plantar fasciitis 12/02/2013  . Benign essential HTN 05/28/2013  . Hepatic steatosis 04/12/2011  . Back pain 06/16/2010  . HEMORRHOIDS, EXTERNAL 12/21/2009  . DEPRESSION, MAJOR, MODERATE 04/04/2007  . HAIR LOSS 08/23/2006  . OBESITY NOS 05/28/2006  . FATIGUE 05/28/2006  . GLUCOSE INTOLERANCE 05/23/2006  . Migraine 05/23/2006     Lorrene ReidKelly Mortimer Bair,  PT 11/15/15 1:14 PM  [facility] [address], [address] [place], [place], [address] Phone: [phone]   Fax:  [phone]  Name: Hailey ButteryFelicia G Coiro MRN: [id number] Date of Birth: [birth year]

## 2015-11-16 ENCOUNTER — Encounter: Payer: Self-pay | Admitting: Physical Therapy

## 2015-11-16 ENCOUNTER — Ambulatory Visit: Payer: PRIVATE HEALTH INSURANCE | Admitting: Physical Therapy

## 2015-11-16 DIAGNOSIS — M25512 Pain in left shoulder: Secondary | ICD-10-CM

## 2015-11-16 DIAGNOSIS — M6281 Muscle weakness (generalized): Secondary | ICD-10-CM

## 2015-11-16 DIAGNOSIS — M25612 Stiffness of left shoulder, not elsewhere classified: Secondary | ICD-10-CM | POA: Diagnosis not present

## 2015-11-16 NOTE — Therapy (Signed)
[facility] [address], [address] [place], [place], [address] Phone: [phone]   Fax:  [phone]  Physical Therapy Treatment  Patient Details  Name: Hailey Hopkins MRN: [id number] Date of Birth: 1975-04-22 Referring Provider: Valma Cavaollins, Andrew, MD  Encounter Date: 11/16/2015      PT End of Session - 11/16/15 1153    Visit Number 34   Number of Visits 37   Date for PT Re-Evaluation 01/06/16   Authorization Type Workers Comp   Authorization - Visit Number 34   Authorization - Number of Visits 37   PT Start Time 1148   PT Stop Time 1245   PT Time Calculation (min) 57 min   Activity Tolerance Patient tolerated treatment well   Behavior During Therapy [facility] for tasks assessed/performed      Past Medical History:  Diagnosis Date  . Depression   . Diverticular disease     Past Surgical History:  Procedure Laterality Date  . ABDOMINAL HYSTERECTOMY    . ROTATOR CUFF REPAIR      There were no vitals filed for this visit.      Subjective Assessment - 11/16/15 1151    Subjective Swelling is coming down, slept with arm elevated which she thinks helped. Pain was bad yesterday but better today.    Currently in Pain? Yes   Pain Score 5    Pain Location Shoulder   Pain Orientation Left   Pain Descriptors / Indicators Sore;Dull   Multiple Pain Sites No                         [facility] Adult PT Treatment/Exercise - 11/16/15 0001      Shoulder Exercises: Standing   Flexion --  Finger ladder 10 reaching #19     Shoulder Exercises: Pulleys   Flexion 3 minutes   ABduction 3 minutes     Shoulder Exercises: ROM/Strengthening   UBE (Upper Arm Bike) L1 x 4 min     Shoulder Exercises: Stretch   Other Shoulder Stretches Cane behind the back 10x2  VC for posture and to not move from Dietitianwrists     Electrical Stimulation   Electrical Stimulation Location left shoulder  with PROM   Electrical Stimulation  Goals Pain     Vasopneumatic   Number Minutes Vasopneumatic  15 minutes   Vasopnuematic Location  Shoulder   Vasopneumatic Pressure Medium   Vasopneumatic Temperature  3 flakes     Manual Therapy   Manual therapy comments PROM all planes to tolerance                  PT Short Term Goals - 11/11/15 1205      PT SHORT TERM GOAL #1   Title She will be independent with inital HEP    Status Achieved     PT SHORT TERM GOAL #2   Title report a 25% reduction in Lt shoulder pain with use at home and work   Status Achieved     PT SHORT TERM GOAL #3   Title demonstrate Lt shoulder AROM flexion to 145 to improve use with self-care and overhead reaching   Time 4   Period Weeks   Status New           PT Long Term Goals - 11/11/15 1206      PT LONG TERM GOAL #1   Title be independent in advanced HEP   Time 8  Period Weeks   Status On-going     PT LONG TERM GOAL #2   Title improve FOTO to < or = to 46% limitation   Time 8   Period Weeks   Status On-going     PT LONG TERM GOAL #3   Title report a 60% reduction in Lt shoulder pain with home tasks   Time 8   Period Weeks   Status Revised     PT LONG TERM GOAL #4   Title demonstrate 4+/5 Lt shoulder strength throughout to improve endurance for Lt UE use   Time 8   Period Weeks   Status Revised     PT LONG TERM GOAL #5   Title able to lift to waist and carry 10 pounds or more to increase home and work participation as MD allows   Time 8   Period Weeks   Status On-going     PT LONG TERM GOAL #6   Title demonstrate Lt shoulder IR to lacking < or = to 2 inches vs the Rt to improve self-care/dressing   Time 8   Period Weeks   Status On-going               Plan - 11/16/15 1154    Clinical Impression Statement Better day today, less pain and swelling visably down. Pt thinks she is getting her stitches out tomorrow.      Rehab Potential Good   Clinical Impairments Affecting Rehab Potential 11/10/15  left arthroscopic SAD/DCR   PT Frequency 5x / week   PT Duration 8 weeks   PT Treatment/Interventions ADLs/Self Care Home Management;Cryotherapy;Electrical Stimulation;Iontophoresis 4mg /ml Dexamethasone;Moist Heat;Therapeutic exercise;Therapeutic activities;Functional mobility training;Ultrasound;Neuromuscular re-education;Patient/family education;Manual techniques;Taping;Dry needling;Passive range of motion;Vasopneumatic Device   PT Next Visit Plan PROM; AAROM; isometrics, scapular mobility;  e-stim for pain control;  vasocompression for edema management, pt getting stitches out tomorrow.    Consulted and Agree with Plan of Care --      Patient will benefit from skilled therapeutic intervention in order to improve the following deficits and impairments:  Postural dysfunction, Decreased strength, Impaired flexibility, Pain, Impaired UE functional use, Decreased activity tolerance, Decreased range of motion  Visit Diagnosis: Shoulder stiffness, left  Muscle weakness (generalized)  Pain in left shoulder     Problem List Patient Active Problem List   Diagnosis Date Noted  . Insomnia 10/29/2014  . Plantar fasciitis 12/02/2013  . Benign essential HTN 05/28/2013  . Hepatic steatosis 04/12/2011  . Back pain 06/16/2010  . HEMORRHOIDS, EXTERNAL 12/21/2009  . DEPRESSION, MAJOR, MODERATE 04/04/2007  . HAIR LOSS 08/23/2006  . OBESITY NOS 05/28/2006  . FATIGUE 05/28/2006  . GLUCOSE INTOLERANCE 05/23/2006  . Migraine 05/23/2006    Saryna Kneeland, PTA 11/16/2015, 12:31 PM  [facility] [address], [address] [place], [place], [address] Phone: [phone]   Fax:  [phone]  Name: Hailey Hopkins MRN: [id number] Date of Birth: 1975/04/20

## 2015-11-17 ENCOUNTER — Encounter: Payer: Self-pay | Admitting: Physical Therapy

## 2015-11-17 ENCOUNTER — Ambulatory Visit: Payer: PRIVATE HEALTH INSURANCE | Admitting: Physical Therapy

## 2015-11-17 DIAGNOSIS — M25512 Pain in left shoulder: Secondary | ICD-10-CM

## 2015-11-17 DIAGNOSIS — M25612 Stiffness of left shoulder, not elsewhere classified: Secondary | ICD-10-CM

## 2015-11-17 DIAGNOSIS — M6281 Muscle weakness (generalized): Secondary | ICD-10-CM

## 2015-11-17 DIAGNOSIS — G8929 Other chronic pain: Secondary | ICD-10-CM

## 2015-11-17 NOTE — Therapy (Signed)
[facility] [address], [address] [place], [place], [address] Phone: [phone]   Fax:  [phone]  Physical Therapy Treatment  Patient Details  Name: Hailey Hopkins MRN: [id number] Date of Birth: 12/17/1975 Referring Provider: Valma Cavaollins, Andrew, MD  Encounter Date: 11/17/2015      PT End of Session - 11/17/15 1450    Visit Number 35   Number of Visits 37   Date for PT Re-Evaluation 01/06/16   Authorization Type Workers Comp   Authorization - Visit Number 35   Authorization - Number of Visits 37   PT Start Time 1445   PT Stop Time 1540   PT Time Calculation (min) 55 min   Activity Tolerance Patient tolerated treatment well   [facility] tasks assessed/performed      Past Medical History:  Diagnosis Date  . Depression   . Diverticular disease     Past Surgical History:  Procedure Laterality Date  . ABDOMINAL HYSTERECTOMY    . ROTATOR CUFF REPAIR      There were no vitals filed for this visit.      Subjective Assessment - 11/17/15 1446    Subjective Swelling into to bad today, pt already iced this morning. Pain is not bad today. Pt states she is just tierd.    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  Lt shoulder surgery 12/14/2014 for partial rotator cuff repair and distal clavicle excision. March 15th injection into left shoulder under Ultrasound to break up scar tissue   Diagnostic tests injection: Cortizone 04/2015   Patient Stated Goals reduce pain, return to normal use of Lt UE   Currently in Pain? Yes   Pain Score 4    Pain Location Shoulder   Pain Orientation Left   Pain Descriptors / Indicators Dull;Sore   Pain Type Surgical pain   Pain Onset More than a month ago   Pain Frequency Constant   Aggravating Factors  Movement   Pain Relieving Factors Pain meds   Multiple Pain Sites No                         [facility] Adult PT Treatment/Exercise  - 11/17/15 0001      Shoulder Exercises: Standing   Other Standing Exercises 6 way shoulder isometrics     Shoulder Exercises: Pulleys   Flexion 3 minutes   ABduction 3 minutes     Shoulder Exercises: ROM/Strengthening   UBE (Upper Arm Bike) L1 x 4 min     Electrical Stimulation   Electrical Stimulation Location left shoulder  with PROM     Vasopneumatic   Number Minutes Vasopneumatic  15 minutes   Vasopnuematic Location  Shoulder   Vasopneumatic Pressure Medium   Vasopneumatic Temperature  3 flakes     Manual Therapy   Manual therapy comments PROM all planes to tolerance                  PT Short Term Goals - 11/17/15 1534      PT SHORT TERM GOAL #1   Title She will be independent with inital HEP    Baseline can reproduce correctly without cues.   Period Weeks   Status Achieved     PT SHORT TERM GOAL #2   Title report a 25% reduction in Lt shoulder pain with use at home and work   Baseline AA arm to fix hair   Time 4  Period Weeks   Status Achieved     PT SHORT TERM GOAL #3   Title demonstrate Lt shoulder AROM flexion to 145 to improve use with self-care and overhead reaching   Baseline understands and uses RICE at home   Time 4   Period Weeks   Status On-going           PT Long Term Goals - 11/17/15 1535      PT LONG TERM GOAL #1   Title be independent in advanced HEP   Time 8   Period Weeks   Status On-going     PT LONG TERM GOAL #2   Title improve FOTO to < or = to 46% limitation   Baseline 110 today   Time 8   Period Weeks   Status On-going     PT LONG TERM GOAL #3   Title report a 60% reduction in Lt shoulder pain with home tasks   Baseline more painful in the last week   Time 8   Period Weeks   Status On-going     PT LONG TERM GOAL #4   Title demonstrate 4+/5 Lt shoulder strength throughout to improve endurance for Lt UE use   Baseline Pain with endrange left shoulder flexion   Time 8   Period Weeks   Status On-going      PT LONG TERM GOAL #5   Title able to lift to waist and carry 10 pounds or more to increase home and work participation as MD allows   Baseline able to do in clinic    Time 8   Period Weeks   Status On-going     PT LONG TERM GOAL #6   Title demonstrate Lt shoulder IR to lacking < or = to 2 inches vs the Rt to improve self-care/dressing   Time 8   Period Weeks   Status On-going               Plan - 11/17/15 1532    Clinical Impression Statement Pt reports 4/10 pain today. Able to tolerate isometric strengthening exercises well. PROM measurments 158* flexion, 101* abduction, 58* external rotation, 76* internal rotation.  Will continue with strengthening and ROM.    Rehab Potential Good   Clinical Impairments Affecting Rehab Potential 11/10/15 left arthroscopic SAD/DCR   PT Frequency 5x / week   PT Duration 8 weeks   PT Treatment/Interventions ADLs/Self Care Home Management;Cryotherapy;Electrical Stimulation;Iontophoresis 4mg /ml Dexamethasone;Moist Heat;Therapeutic exercise;Therapeutic activities;Functional mobility training;Ultrasound;Neuromuscular re-education;Patient/family education;Manual techniques;Taping;Dry needling;Passive range of motion;Vasopneumatic Device   PT Next Visit Plan PROM; AAROM; isometrics, scapular mobility;  e-stim for pain control;  vasocompression for edema management, pt getting stitches out tomorrow.    Consulted and Agree with Plan of Care Patient      Patient will benefit from skilled therapeutic intervention in order to improve the following deficits and impairments:  Postural dysfunction, Decreased strength, Impaired flexibility, Pain, Impaired UE functional use, Decreased activity tolerance, Decreased range of motion  Visit Diagnosis: Shoulder stiffness, left  Muscle weakness (generalized)  Pain in left shoulder  Muscle weakness of left arm  Chronic pain in left shoulder     Problem List Patient Active Problem List   Diagnosis  Date Noted  . Insomnia 10/29/2014  . Plantar fasciitis 12/02/2013  . Benign essential HTN 05/28/2013  . Hepatic steatosis 04/12/2011  . Back pain 06/16/2010  . HEMORRHOIDS, EXTERNAL 12/21/2009  . DEPRESSION, MAJOR, MODERATE 04/04/2007  . HAIR LOSS 08/23/2006  . OBESITY NOS 05/28/2006  .  FATIGUE 05/28/2006  . GLUCOSE INTOLERANCE 05/23/2006  . Migraine 05/23/2006    Dessa PhiKatherine Matthews PTA 11/17/2015, 3:36 PM  [facility] [address], [address] [place], [place], [address] Phone: [phone]   Fax:  [phone]  Name: Hailey Hopkins MRN: [id number] Date of Birth: [birth year]

## 2015-11-18 ENCOUNTER — Ambulatory Visit: Payer: PRIVATE HEALTH INSURANCE | Admitting: Physical Therapy

## 2015-11-18 ENCOUNTER — Encounter: Payer: Self-pay | Admitting: Physical Therapy

## 2015-11-18 DIAGNOSIS — M6281 Muscle weakness (generalized): Secondary | ICD-10-CM

## 2015-11-18 DIAGNOSIS — M25512 Pain in left shoulder: Secondary | ICD-10-CM

## 2015-11-18 DIAGNOSIS — M25612 Stiffness of left shoulder, not elsewhere classified: Secondary | ICD-10-CM | POA: Diagnosis not present

## 2015-11-18 NOTE — Therapy (Signed)
[facility] [address], [address] [place], [place], [address] Phone: [phone]   Fax:  [phone]  Physical Therapy Treatment  Patient Details  Name: Hailey Hopkins MRN: [id number] Date of Birth: 09-20-1975 Referring Provider: Valma Cava, MD  Encounter Date: 11/18/2015      PT End of Session - 11/18/15 1111    Visit Number 36   Number of Visits 37   Date for PT Re-Evaluation 01/06/16   Authorization Type Workers Comp   Authorization - Visit Number 36   Authorization - Number of Visits 37   PT Start Time 1101   PT Stop Time 1155   PT Time Calculation (min) 54 min   Activity Tolerance Patient tolerated treatment well   Behavior During Therapy [facility] for tasks assessed/performed      Past Medical History:  Diagnosis Date  . Depression   . Diverticular disease     Past Surgical History:  Procedure Laterality Date  . ABDOMINAL HYSTERECTOMY    . ROTATOR CUFF REPAIR      There were no vitals filed for this visit.      Subjective Assessment - 11/18/15 1109    Subjective Pt got her stitches out today, some soreness but not too bad. PA reports no structures had to be repaired during surgery.    Currently in Pain? Yes   Pain Score 4    Pain Location Shoulder   Pain Orientation Left   Pain Descriptors / Indicators Sore   Multiple Pain Sites No            [facility] PT Assessment - 11/18/15 0001      AROM   Left Shoulder Flexion 135 Degrees   Left Shoulder ABduction 95 Degrees   Left Shoulder External Rotation --  Reaches to top of jeans; lower lumbar     Strength   Left Shoulder Flexion 3-/5   Left Shoulder ABduction 3-/5                     [facility] Adult PT Treatment/Exercise - 11/18/15 0001      Shoulder Exercises: Standing   Flexion --  Finger ladder reaching #20 10x      Shoulder Exercises: Pulleys   Flexion 3 minutes   ABduction 3 minutes     Shoulder Exercises:  ROM/Strengthening   UBE (Upper Arm Bike) L1 x 4 min     Shoulder Exercises: Isometric Strengthening   Flexion 5X5"   Extension 5X5"   External Rotation 5X5"   Internal Rotation 5X5"     Shoulder Exercises: Stretch   Other Shoulder Stretches Cane behind the back 10x2  VC for posture and to not move from Geneticist, molecular Location left shoulder   Electrical Stimulation Action IFC   Electrical Stimulation Goals Pain     Vasopneumatic   Number Minutes Vasopneumatic  15 minutes   Vasopnuematic Location  Shoulder   Vasopneumatic Pressure Medium   Vasopneumatic Temperature  3 flakes                PT Education - 11/18/15 1129    Education provided Yes   Education Details Behind the back IR stretch and supine cane flexion   Person(s) Educated Patient   Methods Explanation;Demonstration;Tactile cues;Verbal cues   Comprehension Verbalized understanding;Returned demonstration  Did 2 sets holding as long as she could focusing on relaxing  PT Short Term Goals - 11/17/15 1534      PT SHORT TERM GOAL #1   Title She will be independent with inital HEP    Baseline can reproduce correctly without cues.   Period Weeks   Status Achieved     PT SHORT TERM GOAL #2   Title report a 25% reduction in Lt shoulder pain with use at home and work   Baseline AA arm to fix hair   Time 4   Period Weeks   Status Achieved     PT SHORT TERM GOAL #3   Title demonstrate Lt shoulder AROM flexion to 145 to improve use with self-care and overhead reaching   Baseline understands and uses RICE at home   Time 4   Period Weeks   Status On-going           PT Long Term Goals - 11/17/15 1535      PT LONG TERM GOAL #1   Title be independent in advanced HEP   Time 8   Period Weeks   Status On-going     PT LONG TERM GOAL #2   Title improve FOTO to < or = to 46% limitation   Baseline 110 today   Time 8   Period Weeks   Status  On-going     PT LONG TERM GOAL #3   Title report a 60% reduction in Lt shoulder pain with home tasks   Baseline more painful in the last week   Time 8   Period Weeks   Status On-going     PT LONG TERM GOAL #4   Title demonstrate 4+/5 Lt shoulder strength throughout to improve endurance for Lt UE use   Baseline Pain with endrange left shoulder flexion   Time 8   Period Weeks   Status On-going     PT LONG TERM GOAL #5   Title able to lift to waist and carry 10 pounds or more to increase home and work participation as MD allows   Baseline able to do in clinic    Time 8   Period Weeks   Status On-going     PT LONG TERM GOAL #6   Title demonstrate Lt shoulder IR to lacking < or = to 2 inches vs the Rt to improve self-care/dressing   Time 8   Period Weeks   Status On-going               Plan - 11/18/15 1139    Clinical Impression Statement Pt increased her AROM significantly since her manipulation. Added IR behind the back and supine flexion to pt's HEP this weekend. Isometrics do not increase any pain. Stitches were taken out today.    Rehab Potential Good   Clinical Impairments Affecting Rehab Potential 11/10/15 left arthroscopic SAD/DCR   PT Frequency 5x / week   PT Duration 8 weeks   PT Treatment/Interventions ADLs/Self Care Home Management;Cryotherapy;Electrical Stimulation;Iontophoresis 4mg /ml Dexamethasone;Moist Heat;Therapeutic exercise;Therapeutic activities;Functional mobility training;Ultrasound;Neuromuscular re-education;Patient/family education;Manual techniques;Taping;Dry needling;Passive range of motion;Vasopneumatic Device   PT Next Visit Plan ROM exercises, continue with edema management, consider scapular attached band exercises. Pt to continue with PT 5x next week.   Consulted and Agree with Plan of Care Patient      Patient will benefit from skilled therapeutic intervention in order to improve the following deficits and impairments:  Postural  dysfunction, Decreased strength, Impaired flexibility, Pain, Impaired UE functional use, Decreased activity tolerance, Decreased range of motion  Visit Diagnosis: Shoulder stiffness,  left  Muscle weakness (generalized)  Pain in left shoulder     Problem List Patient Active Problem List   Diagnosis Date Noted  . Insomnia 10/29/2014  . Plantar fasciitis 12/02/2013  . Benign essential HTN 05/28/2013  . Hepatic steatosis 04/12/2011  . Back pain 06/16/2010  . HEMORRHOIDS, EXTERNAL 12/21/2009  . DEPRESSION, MAJOR, MODERATE 04/04/2007  . HAIR LOSS 08/23/2006  . OBESITY NOS 05/28/2006  . FATIGUE 05/28/2006  . GLUCOSE INTOLERANCE 05/23/2006  . Migraine 05/23/2006    Kristelle Cavallaro, PTA 11/18/2015, 11:43 AM  [facility] [address], [address] [place], [place], [address] Phone: [phone]   Fax:  [phone]  Name: Hailey Hopkins MRN: [id number] Date of Birth: Mar 24, 1976

## 2015-11-21 ENCOUNTER — Encounter: Payer: Self-pay | Admitting: Physical Therapy

## 2015-11-21 ENCOUNTER — Ambulatory Visit: Payer: PRIVATE HEALTH INSURANCE | Admitting: Physical Therapy

## 2015-11-21 DIAGNOSIS — M25612 Stiffness of left shoulder, not elsewhere classified: Secondary | ICD-10-CM

## 2015-11-21 DIAGNOSIS — M6281 Muscle weakness (generalized): Secondary | ICD-10-CM

## 2015-11-21 DIAGNOSIS — M25512 Pain in left shoulder: Secondary | ICD-10-CM

## 2015-11-21 NOTE — Therapy (Signed)
[facility] [address] [place], [place], [address] Phone: [phone]   Fax:  [phone]  Physical Therapy Treatment  Patient Details  Name: Hailey Hopkins MRN: [id number] Date of Birth: 1975/12/05 Referring Provider: Valma Cava, MD  Encounter Date: 11/21/2015      PT End of Session - 11/21/15 0855    Visit Number 37   Number of Visits 37   Date for PT Re-Evaluation 01/06/16   Authorization Type Workers Comp   Authorization - Visit Number 37   Authorization - Number of Visits 37   PT Start Time 0850   PT Stop Time 0945   PT Time Calculation (min) 55 min   Activity Tolerance Patient tolerated treatment well   Behavior During Therapy [facility] for tasks assessed/performed      Past Medical History:  Diagnosis Date  . Depression   . Diverticular disease     Past Surgical History:  Procedure Laterality Date  . ABDOMINAL HYSTERECTOMY    . ROTATOR CUFF REPAIR      There were no vitals filed for this visit.      Subjective Assessment - 11/21/15 0853    Subjective Not sleeping very well.    Currently in Pain? Yes   Pain Score 5    Pain Location Shoulder   Pain Orientation Left   Pain Descriptors / Indicators Dull;Aching   Aggravating Factors  At night   Pain Relieving Factors  Ice   Multiple Pain Sites No                         [facility] Adult PT Treatment/Exercise - 11/21/15 0001      Shoulder Exercises: Standing   Extension Strengthening;Both;20 reps;Theraband   Theraband Level (Shoulder Extension) Level 1 (Yellow)   Row Strengthening;Both;20 reps;Theraband   Theraband Level (Shoulder Row) Level 1 (Yellow)   Other Standing Exercises Ball rolls on wall 10x each direction:      Shoulder Exercises: ROM/Strengthening   UBE (Upper Arm Bike) L1 x 5 min frwd     Shoulder Exercises: Stretch   Wall Stretch - Flexion --  10x ball up the wall.   Other Shoulder Stretches IR behind  the back stretches     Electrical Stimulation   Electrical Stimulation Location left shoulder   Electrical Stimulation Action IFC   Electrical Stimulation Goals Pain     Vasopneumatic   Number Minutes Vasopneumatic  15 minutes   Vasopnuematic Location  Shoulder   Vasopneumatic Pressure Medium   Vasopneumatic Temperature  3 flakes                  PT Short Term Goals - 11/21/15 0910      PT SHORT TERM GOAL #3   Title demonstrate Lt shoulder AROM flexion to 145 to improve use with self-care and overhead reaching   Time 4   Period Weeks   Status On-going           PT Long Term Goals - 11/17/15 1535      PT LONG TERM GOAL #1   Title be independent in advanced HEP   Time 8   Period Weeks   Status On-going     PT LONG TERM GOAL #2   Title improve FOTO to < or = to 46% limitation   Baseline 110 today   Time 8   Period Weeks   Status On-going     PT LONG  TERM GOAL #3   Title report a 60% reduction in Lt shoulder pain with home tasks   Baseline more painful in the last week   Time 8   Period Weeks   Status On-going     PT LONG TERM GOAL #4   Title demonstrate 4+/5 Lt shoulder strength throughout to improve endurance for Lt UE use   Baseline Pain with endrange left shoulder flexion   Time 8   Period Weeks   Status On-going     PT LONG TERM GOAL #5   Title able to lift to waist and carry 10 pounds or more to increase home and work participation as MD allows   Baseline able to do in clinic    Time 8   Period Weeks   Status On-going     PT LONG TERM GOAL #6   Title demonstrate Lt shoulder IR to lacking < or = to 2 inches vs the Rt to improve self-care/dressing   Time 8   Period Weeks   Status On-going               Plan - 11/21/15 [id number]    Clinical Impression Statement Pt tolerated 40 min of ther ex today with very minimal pain complaints. Added some light theraband work to strengthen the scapular muscles.    Rehab Potential Good    Clinical Impairments Affecting Rehab Potential 11/10/15 left arthroscopic SAD/DCR   PT Frequency 5x / week   PT Duration 8 weeks   PT Treatment/Interventions ADLs/Self Care Home Management;Cryotherapy;Electrical Stimulation;Iontophoresis 4mg /ml Dexamethasone;Moist Heat;Therapeutic exercise;Therapeutic activities;Functional mobility training;Ultrasound;Neuromuscular re-education;Patient/family education;Manual techniques;Taping;Dry needling;Passive range of motion;Vasopneumatic Device   PT Next Visit Plan ROM exercises, continue with edema management, continue with band exs. Pt to continue with PT 5x next week.      Patient will benefit from skilled therapeutic intervention in order to improve the following deficits and impairments:  Postural dysfunction, Decreased strength, Impaired flexibility, Pain, Impaired UE functional use, Decreased activity tolerance, Decreased range of motion  Visit Diagnosis: Shoulder stiffness, left  Muscle weakness (generalized)  Pain in left shoulder     Problem List Patient Active Problem List   Diagnosis Date Noted  . Insomnia 10/29/2014  . Plantar fasciitis 12/02/2013  . Benign essential HTN 05/28/2013  . Hepatic steatosis 04/12/2011  . Back pain 06/16/2010  . HEMORRHOIDS, EXTERNAL 12/21/2009  . DEPRESSION, MAJOR, MODERATE 04/04/2007  . HAIR LOSS 08/23/2006  . OBESITY NOS 05/28/2006  . FATIGUE 05/28/2006  . GLUCOSE INTOLERANCE 05/23/2006  . Migraine 05/23/2006    Juliza Machnik, PTA 11/21/2015, 9:29 AM  [facility] [address], [address] [place], [place], [address] Phone: [phone]   Fax:  [phone]  Name: Hailey Hopkins MRN: [id number] Date of Birth: 08/19/1975

## 2015-11-22 ENCOUNTER — Ambulatory Visit: Payer: PRIVATE HEALTH INSURANCE | Admitting: Physical Therapy

## 2015-11-22 ENCOUNTER — Encounter: Payer: Self-pay | Admitting: Physical Therapy

## 2015-11-22 DIAGNOSIS — M25612 Stiffness of left shoulder, not elsewhere classified: Secondary | ICD-10-CM | POA: Diagnosis not present

## 2015-11-22 DIAGNOSIS — M6281 Muscle weakness (generalized): Secondary | ICD-10-CM

## 2015-11-22 DIAGNOSIS — M25512 Pain in left shoulder: Secondary | ICD-10-CM

## 2015-11-22 NOTE — Therapy (Signed)
[facility] [address], [address] [place], [place], [address] Phone: [phone]   Fax:  [phone]  Physical Therapy Treatment  Patient Details  Name: Hailey Hopkins MRN: [id number] Date of Birth: 23-Mar-1976 Referring Provider: Valma Cava, MD  Encounter Date: 11/22/2015      Hailey Hopkins End of Session - 11/22/15 1242    Visit Number 38   Number of Visits 47   Date for Hailey Hopkins Re-Evaluation 01/06/16   Authorization Type Workers Comp   Authorization - Visit Number 38   Authorization - Number of Visits 47   Hailey Hopkins Start Time 1234   Hailey Hopkins Stop Time 1315   Hailey Hopkins Time Calculation (min) 41 min   Activity Tolerance Patient tolerated treatment well   Behavior During Therapy WFL for tasks assessed/performed      Past Medical History:  Diagnosis Date  . Depression   . Diverticular disease     Past Surgical History:  Procedure Laterality Date  . ABDOMINAL HYSTERECTOMY    . ROTATOR CUFF REPAIR      There were no vitals filed for this visit.      Subjective Assessment - 11/22/15 1239    Subjective Not sleeping very well. The left shoulder pain will wake her up due to just taking Tylenol.    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  Lt shoulder surgery 12/14/2014 for partial rotator cuff repair and distal clavicle excision. March 15th injection into left shoulder under Ultrasound to break up scar tissue   Diagnostic tests injection: Cortizone 04/2015   Patient Stated Goals reduce pain, return to normal use of Lt UE   Currently in Pain? Yes   Pain Score 3    Pain Location Shoulder   Pain Orientation Left   Pain Descriptors / Indicators Aching   Pain Type Surgical pain   Pain Onset More than a month ago   Pain Frequency Constant   Aggravating Factors  movement   Pain Relieving Factors ice   Multiple Pain Sites No                         [facility] Adult Hailey Hopkins Treatment/Exercise - 11/22/15 0001      Shoulder Exercises: Standing   Extension Strengthening;Both;20 reps;Theraband   Theraband Level (Shoulder Extension) Level 1 (Yellow)   Row Strengthening;Both;20 reps;Theraband   Theraband Level (Shoulder Row) Level 1 (Yellow)   Other Standing Exercises Ball rolls on wall 10x each direction:      Shoulder Exercises: Pulleys   Flexion 3 minutes   ABduction 3 minutes     Shoulder Exercises: ROM/Strengthening   UBE (Upper Arm Bike) L1 x 5 min frwd     Electrical Stimulation   Electrical Stimulation Location left shoulder   Electrical Stimulation Action IFC   Electrical Stimulation Parameters 15 min to patient tolerance, 15 min   Electrical Stimulation Goals Pain     Vasopneumatic   Number Minutes Vasopneumatic  15 minutes   Vasopnuematic Location  Shoulder   Vasopneumatic Pressure Medium   Vasopneumatic Temperature  3 flakes                Hailey Hopkins Education - 11/22/15 1313    Education provided No          Hailey Hopkins Short Term Goals - 11/21/15 0910      Hailey Hopkins SHORT TERM GOAL #3   Title demonstrate Lt shoulder AROM flexion to 145 to improve use with self-care and overhead  reaching   Time 4   Period Weeks   Status On-going           Hailey Hopkins Long Term Goals - 11/17/15 1535      Hailey Hopkins LONG TERM GOAL #1   Title be independent in advanced HEP   Time 8   Period Weeks   Status On-going     Hailey Hopkins LONG TERM GOAL #2   Title improve FOTO to < or = to 46% limitation   Baseline 110 today   Time 8   Period Weeks   Status On-going     Hailey Hopkins LONG TERM GOAL #3   Title report a 60% reduction in Lt shoulder pain with home tasks   Baseline more painful in the last week   Time 8   Period Weeks   Status On-going     Hailey Hopkins LONG TERM GOAL #4   Title demonstrate 4+/5 Lt shoulder strength throughout to improve endurance for Lt UE use   Baseline Pain with endrange left shoulder flexion   Time 8   Period Weeks   Status On-going     Hailey Hopkins LONG TERM GOAL #5   Title able to lift to waist and carry 10  pounds or more to increase home and work participation as MD allows   Baseline able to do in clinic    Time 8   Period Weeks   Status On-going     Hailey Hopkins LONG TERM GOAL #6   Title demonstrate Lt shoulder IR to lacking < or = to 2 inches vs the Rt to improve self-care/dressing   Time 8   Period Weeks   Status On-going               Plan - 11/22/15 1313    Clinical Impression Statement Patient has limited left scapular mobility.  Patient has tighteness located in anterior left shoulder.  Patient will contract the left upper trapezius with shoulder flexion and abduction. Patient is having trouble with  sleeping due to pain. Patient will benefit from skilled therapy to improve left shoulder ROM and strength.    Rehab Potential Good   Clinical Impairments Affecting Rehab Potential 11/10/15 left arthroscopic SAD/DCR   Hailey Hopkins Frequency 5x / week   Hailey Hopkins Duration 8 weeks   Hailey Hopkins Treatment/Interventions ADLs/Self Care Home Management;Cryotherapy;Electrical Stimulation;Iontophoresis 4mg /ml Dexamethasone;Moist Heat;Therapeutic exercise;Therapeutic activities;Functional mobility training;Ultrasound;Neuromuscular re-education;Patient/family education;Manual techniques;Taping;Dry needling;Passive range of motion;Vasopneumatic Device   Hailey Hopkins Next Visit Plan ROM exercises, continue with edema management, continue with band exs. , scapular ROM, left shoulder PROM and AAROM; soft tissue work. Hailey Hopkins to continue with Hailey Hopkins 5x next week.   Hailey Hopkins Home Exercise Plan ROM exercises   Recommended Other Services none   Consulted and Agree with Plan of Care Patient      Patient will benefit from skilled therapeutic intervention in order to improve the following deficits and impairments:  Postural dysfunction, Decreased strength, Impaired flexibility, Pain, Impaired UE functional use, Decreased activity tolerance, Decreased range of motion  Visit Diagnosis: Shoulder stiffness, left  Muscle weakness (generalized)  Pain in left  shoulder     Problem List Patient Active Problem List   Diagnosis Date Noted  . Insomnia 10/29/2014  . Plantar fasciitis 12/02/2013  . Benign essential HTN 05/28/2013  . Hepatic steatosis 04/12/2011  . Back pain 06/16/2010  . HEMORRHOIDS, EXTERNAL 12/21/2009  . DEPRESSION, MAJOR, MODERATE 04/04/2007  . HAIR LOSS 08/23/2006  . OBESITY NOS 05/28/2006  . FATIGUE 05/28/2006  . GLUCOSE INTOLERANCE 05/23/2006  .  Migraine 05/23/2006    Hailey Hopkins, Hailey Hopkins 11/22/15 1:20 PM   [facility] [address], [address] [place], [place], [address] Phone: [phone]   Fax:  [phone]  Name: Hailey Hopkins MRN: [id number] Date of Birth: 09/29/1975

## 2015-11-23 ENCOUNTER — Ambulatory Visit: Payer: PRIVATE HEALTH INSURANCE | Admitting: Physical Therapy

## 2015-11-24 ENCOUNTER — Ambulatory Visit: Payer: PRIVATE HEALTH INSURANCE | Admitting: Physical Therapy

## 2015-11-24 DIAGNOSIS — M25612 Stiffness of left shoulder, not elsewhere classified: Secondary | ICD-10-CM

## 2015-11-24 DIAGNOSIS — M6281 Muscle weakness (generalized): Secondary | ICD-10-CM

## 2015-11-24 DIAGNOSIS — M25512 Pain in left shoulder: Secondary | ICD-10-CM

## 2015-11-24 NOTE — Therapy (Signed)
[facility] [address], [address] [place], [place], [address] Phone: [phone]   Fax:  [phone]  Physical Therapy Treatment  Patient Details  Name: Hailey ButteryFelicia G Tussey MRN: [id number] Date of Birth: September 10, 1975 Referring Provider: Valma Cavaollins, Andrew, MD  Encounter Date: 11/24/2015      PT End of Session - 11/24/15 1047    Visit Number 39   Number of Visits 47   Date for PT Re-Evaluation 01/06/16   Authorization Type Workers Comp   Authorization - Number of Visits 47   PT Start Time 1015   PT Stop Time 1110   PT Time Calculation (min) 55 min   Activity Tolerance Patient tolerated treatment well      Past Medical History:  Diagnosis Date  . Depression   . Diverticular disease     Past Surgical History:  Procedure Laterality Date  . ABDOMINAL HYSTERECTOMY    . ROTATOR CUFF REPAIR      There were no vitals filed for this visit.      Subjective Assessment - 11/24/15 1017    Subjective Had a migraine yesterday and it is lingering today.  My shoulder is doing pretty good, not too bad.     Currently in Pain? Yes   Pain Score 2    Pain Location Shoulder   Pain Orientation Left   Pain Type Surgical pain            [facility] PT Assessment - 11/24/15 0001      AROM   Left Shoulder Flexion 170 Degrees   Left Shoulder ABduction 128 Degrees   Left Shoulder Internal Rotation --  L4-5 with other hand assist   Left Shoulder External Rotation 60 Degrees     Strength   Left Shoulder Flexion 3/5   Left Shoulder ABduction 3/5   Left Shoulder Internal Rotation 3/5   Left Shoulder External Rotation 3/5                     [facility] Adult PT Treatment/Exercise - 11/24/15 0001      Shoulder Exercises: Sidelying   External Rotation AROM;Left;20 reps     Shoulder Exercises: Standing   Extension Strengthening;Both;20 reps;Theraband   Theraband Level (Shoulder Extension) Level 1 (Yellow)   Row  Strengthening;Both;20 reps;Theraband   Theraband Level (Shoulder Row) Level 1 (Yellow)   Other Standing Exercises Ball rolls on table, counter an d   Other Standing Exercises wall push ups 15x     Electrical Stimulation   Electrical Stimulation Location left shoulder   Electrical Stimulation Action IFC   Electrical Stimulation Parameters 15 min intensity to tolerance   Electrical Stimulation Goals Pain     Vasopneumatic   Number Minutes Vasopneumatic  15 minutes   Vasopnuematic Location  Shoulder   Vasopneumatic Pressure Medium   Vasopneumatic Temperature  3 flakes     Manual Therapy   Manual Therapy Scapular mobilization   Scapular Mobilization medial and lateral glides 10x, manual upper trap stretch 3x 20 sec holds.  manually resisted scapular PNF                  PT Short Term Goals - 11/24/15 1857      PT SHORT TERM GOAL #1   Title She will be independent with inital HEP    Status Achieved     PT SHORT TERM GOAL #2   Title report a 25% reduction in Lt shoulder pain with use at home and  work   Status Achieved     PT SHORT TERM GOAL #3   Title demonstrate Lt shoulder AROM flexion to 145 to improve use with self-care and overhead reaching   Status Achieved           PT Long Term Goals - 11/24/15 1857      PT LONG TERM GOAL #1   Title be independent in advanced HEP   Time 8   Period Weeks   Status On-going     PT LONG TERM GOAL #2   Title improve FOTO to < or = to 46% limitation   Time 8   Period Weeks   Status On-going     PT LONG TERM GOAL #3   Title report a 60% reduction in Lt shoulder pain with home tasks   Time 8   Period Weeks   Status On-going     PT LONG TERM GOAL #4   Title demonstrate 4+/5 Lt shoulder strength throughout to improve endurance for Lt UE use   Time 8   Period Weeks   Status On-going     PT LONG TERM GOAL #5   Title able to lift to waist and carry 10 pounds or more to increase home and work participation as MD  allows   Time 8   Period Weeks   Status On-going     PT LONG TERM GOAL #6   Title demonstrate Lt shoulder IR to lacking < or = to 2 inches vs the Rt to improve self-care/dressing   Time 8   Period Weeks   Status On-going               Plan - 11/24/15 1048    Clinical Impression Statement The patient reports improving shoulder pain and demonstrates much improved shoulder AROM.  She is able to progress with glenohumeral and scapular stabilization exercises without pain exacerbation.  Therapist cuing to decrease compensatory shoulder shrug.   Good pain relief with e-stim and vasocompression.   Rehab Potential Good   Clinical Impairments Affecting Rehab Potential 11/10/15 left arthroscopic SAD/DCR   PT Frequency 5x / week   PT Duration 8 weeks   PT Next Visit Plan ROM exercises, continue with edema management, continue with band exs. , scapular ROM, left shoulder PROM and AAROM; soft tissue work. Pt to continue with PT 5x next week.      Patient will benefit from skilled therapeutic intervention in order to improve the following deficits and impairments:     Visit Diagnosis: Shoulder stiffness, left  Muscle weakness (generalized)  Pain in left shoulder     Problem List Patient Active Problem List   Diagnosis Date Noted  . Insomnia 10/29/2014  . Plantar fasciitis 12/02/2013  . Benign essential HTN 05/28/2013  . Hepatic steatosis 04/12/2011  . Back pain 06/16/2010  . HEMORRHOIDS, EXTERNAL 12/21/2009  . DEPRESSION, MAJOR, MODERATE 04/04/2007  . HAIR LOSS 08/23/2006  . OBESITY NOS 05/28/2006  . FATIGUE 05/28/2006  . GLUCOSE INTOLERANCE 05/23/2006  . Migraine 05/23/2006   Lavinia Sharps, PT 11/24/15 7:00 PM Phone: [phone] Fax: [phone]  Vivien Presto 11/24/2015, 6:59 PM  [facility] [address] [place], [place], [address] Phone: [phone]   Fax:  [phone]  Name: MIHO MONDA MRN: [id number] Date of Birth: 26-Feb-1976

## 2015-11-25 ENCOUNTER — Ambulatory Visit: Payer: PRIVATE HEALTH INSURANCE | Attending: Surgical | Admitting: Physical Therapy

## 2015-11-25 DIAGNOSIS — M25512 Pain in left shoulder: Secondary | ICD-10-CM

## 2015-11-25 DIAGNOSIS — M6281 Muscle weakness (generalized): Secondary | ICD-10-CM | POA: Insufficient documentation

## 2015-11-25 DIAGNOSIS — R293 Abnormal posture: Secondary | ICD-10-CM | POA: Diagnosis present

## 2015-11-25 DIAGNOSIS — M25612 Stiffness of left shoulder, not elsewhere classified: Secondary | ICD-10-CM | POA: Diagnosis not present

## 2015-11-25 DIAGNOSIS — Z9889 Other specified postprocedural states: Secondary | ICD-10-CM | POA: Insufficient documentation

## 2015-11-25 DIAGNOSIS — G8929 Other chronic pain: Secondary | ICD-10-CM | POA: Diagnosis present

## 2015-11-25 NOTE — Therapy (Signed)
[facility] [address], [address] [place], [place], [address] Phone: [phone]   Fax:  [phone]  Physical Therapy Treatment  Patient Details  Name: Larinda ButteryFelicia G Carter MRN: [id number] Date of Birth: Oct 19, 1975 Referring Provider: Valma Cavaollins, Andrew, MD  Encounter Date: 11/25/2015      PT End of Session - 11/25/15 1035    Visit Number (P)  40   Number of Visits (P)  47   Date for PT Re-Evaluation (P)  01/06/16   Authorization - Number of Visits (P)  47   PT Start Time (P)  1017   PT Stop Time (P)  1110   PT Time Calculation (min) (P)  53 min   Activity Tolerance (P)  Patient tolerated treatment well      Past Medical History:  Diagnosis Date  . Depression   . Diverticular disease     Past Surgical History:  Procedure Laterality Date  . ABDOMINAL HYSTERECTOMY    . ROTATOR CUFF REPAIR      There were no vitals filed for this visit.      Subjective Assessment - 11/25/15 1023    Subjective I was hurting after yesterday and had to take a pain pill to go to sleep.  I think it was the cummulative effect of the ex (not just 1 ex in particular).     Currently in Pain? Yes   Pain Score 4    Pain Location Shoulder   Pain Orientation Left   Pain Type Surgical pain                         [facility] Adult PT Treatment/Exercise - 11/25/15 0001      Shoulder Exercises: Pulleys   Flexion 3 minutes   ABduction 3 minutes     Shoulder Exercises: ROM/Strengthening   Other ROM/Strengthening Exercises large blue ball roll for flexion 30x   Other ROM/Strengthening Exercises pillow case slides on stair railing 20x;  Supine serratus punch 5x     Electrical Stimulation   Electrical Stimulation Location left shoulder   Electrical Stimulation Action IFC   Electrical Stimulation Parameters 15 min intensity to tolerance   Electrical Stimulation Goals Pain     Vasopneumatic   Number Minutes Vasopneumatic  15  minutes   Vasopnuematic Location  Shoulder   Vasopneumatic Pressure Medium   Vasopneumatic Temperature  3 flakes     Manual Therapy   Manual therapy comments Glenohumeral distraction, inferior, lateral grade 2/3 20 sec 3x each   Passive ROM left shoulder flexion, abd, ER 10x each;  90 degrees rhythmic stabilization                  PT Short Term Goals - 11/25/15 1209      PT SHORT TERM GOAL #1   Title She will be independent with inital HEP    Status Achieved     PT SHORT TERM GOAL #2   Title report a 25% reduction in Lt shoulder pain with use at home and work   Status Achieved     PT SHORT TERM GOAL #3   Title demonstrate Lt shoulder AROM flexion to 145 to improve use with self-care and overhead reaching   Status Achieved           PT Long Term Goals - 11/25/15 1209      PT LONG TERM GOAL #1   Title be independent in advanced HEP   Time  8   Period Weeks   Status On-going     PT LONG TERM GOAL #2   Title improve FOTO to < or = to 46% limitation   Time 8   Period Weeks   Status On-going     PT LONG TERM GOAL #3   Title report a 60% reduction in Lt shoulder pain with home tasks   Time 8   Period Weeks   Status On-going     PT LONG TERM GOAL #4   Title demonstrate 4+/5 Lt shoulder strength throughout to improve endurance for Lt UE use   Time 8   Period Weeks   Status On-going     PT LONG TERM GOAL #5   Title able to lift to waist and carry 10 pounds or more to increase home and work participation as MD allows   Time 8   Period Weeks   Status On-going     PT LONG TERM GOAL #6   Title demonstrate Lt shoulder IR to lacking < or = to 2 inches vs the Rt to improve self-care/dressing   Time 8   Period Weeks   Status On-going               Plan - 11/25/15 1204    Clinical Impression Statement Treatment focus on ROM  and symptom control today secondary to increased pain.  No stiffness noted with PROM in all planes and good glenohumeral  joint mobility.  Verbal cues to decrease compensatory shoulder shrug.     Rehab Potential Good   Clinical Impairments Affecting Rehab Potential 11/10/15 left arthroscopic SAD/DCR   PT Frequency 5x / week   PT Duration 8 weeks   PT Treatment/Interventions ADLs/Self Care Home Management;Cryotherapy;Electrical Stimulation;Iontophoresis 4mg /ml Dexamethasone;Moist Heat;Therapeutic exercise;Therapeutic activities;Functional mobility training;Ultrasound;Neuromuscular re-education;Patient/family education;Manual techniques;Taping;Dry needling;Passive range of motion;Vasopneumatic Device   PT Next Visit Plan ROM exercises, continue with edema management, continue with band exs. , scapular ROM, left shoulder PROM and AAROM; soft tissue work. Pt to continue with PT 5x next week.      Patient will benefit from skilled therapeutic intervention in order to improve the following deficits and impairments:  Postural dysfunction, Decreased strength, Impaired flexibility, Pain, Impaired UE functional use, Decreased activity tolerance, Decreased range of motion  Visit Diagnosis: Shoulder stiffness, left - Plan: PT plan of care cert/re-cert  Muscle weakness (generalized) - Plan: PT plan of care cert/re-cert  Pain in left shoulder - Plan: PT plan of care cert/re-cert     Problem List Patient Active Problem List   Diagnosis Date Noted  . Insomnia 10/29/2014  . Plantar fasciitis 12/02/2013  . Benign essential HTN 05/28/2013  . Hepatic steatosis 04/12/2011  . Back pain 06/16/2010  . HEMORRHOIDS, EXTERNAL 12/21/2009  . DEPRESSION, MAJOR, MODERATE 04/04/2007  . HAIR LOSS 08/23/2006  . OBESITY NOS 05/28/2006  . FATIGUE 05/28/2006  . GLUCOSE INTOLERANCE 05/23/2006  . Migraine 05/23/2006   Lavinia Sharps, PT 11/25/15 12:15 PM Phone: [phone] Fax: [phone] Vivien Presto 11/25/2015, 12:14 PM  [facility] [address], [address] [place], [place], [address] Phone: [phone]   Fax:  [phone]  Name: MISSIE GEHRIG MRN: [id number] Date of Birth: Oct 06, 1975

## 2015-11-29 ENCOUNTER — Ambulatory Visit: Payer: PRIVATE HEALTH INSURANCE | Admitting: Physical Therapy

## 2015-11-29 ENCOUNTER — Encounter: Payer: Self-pay | Admitting: Physical Therapy

## 2015-11-29 DIAGNOSIS — M25612 Stiffness of left shoulder, not elsewhere classified: Secondary | ICD-10-CM | POA: Diagnosis not present

## 2015-11-29 DIAGNOSIS — M25512 Pain in left shoulder: Secondary | ICD-10-CM

## 2015-11-29 DIAGNOSIS — M6281 Muscle weakness (generalized): Secondary | ICD-10-CM

## 2015-11-29 NOTE — Therapy (Signed)
[facility] [address], [address] [place], [place], [address] Phone: [phone]   Fax:  [phone]  Physical Therapy Treatment  Patient Details  Name: Hailey Hopkins MRN: [id number] Date of Birth: Aug 09, 1975 Referring Provider: Valma Cavaollins, Andrew, MD  Encounter Date: 11/29/2015      PT End of Session - 11/29/15 1647    Visit Number 41   Number of Visits 47   Date for PT Re-Evaluation 01/06/16   Authorization Type Workers Comp   Authorization - Visit Number 41   Authorization - Number of Visits 47   PT Start Time 1620   PT Stop Time 1705   PT Time Calculation (min) 45 min   Activity Tolerance Patient tolerated treatment well   Behavior During Therapy [facility] for tasks assessed/performed      Past Medical History:  Diagnosis Date  . Depression   . Diverticular disease     Past Surgical History:  Procedure Laterality Date  . ABDOMINAL HYSTERECTOMY    . ROTATOR CUFF REPAIR      There were no vitals filed for this visit.      Subjective Assessment - 11/29/15 1622    Subjective No changes with arm. Patient reports pain is 90% better.  Patient is now able to put her hair up.  Patient reports over the weekend her left hand became red hot and swollen. It lasted for 1.5 hours.    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  Lt shoulder surgery 12/14/2014 for partial rotator cuff repair and distal clavicle excision. March 15th injection into left shoulder under Ultrasound to break up scar tissue   Diagnostic tests injection: Cortizone 04/2015   Patient Stated Goals reduce pain, return to normal use of Lt UE   Currently in Pain? Yes   Pain Score 2    Pain Location Shoulder   Pain Orientation Left   Pain Descriptors / Indicators Aching   Pain Type Surgical pain   Pain Onset More than a month ago   Pain Frequency Constant   Aggravating Factors  movement   Pain Relieving Factors ice   Multiple Pain Sites  No                         [facility] Adult PT Treatment/Exercise - 11/29/15 0001      Shoulder Exercises: Standing   Extension Strengthening;Both;10 reps  5# on tower   Row Strengthening;Both;10 reps;Weights  15# with tower     Shoulder Exercises: Pulleys   Flexion 3 minutes   ABduction 3 minutes     Shoulder Exercises: ROM/Strengthening   Other ROM/Strengthening Exercises large blue ball roll for flexion 30x   Other ROM/Strengthening Exercises pillow case slides on stair railing 20x     Modalities   Modalities Electrical Stimulation;Vasopneumatic     Electrical Stimulation   Electrical Stimulation Location left shoulder   Electrical Stimulation Action IFC   Electrical Stimulation Parameters 15 min, to patient tolerance   Electrical Stimulation Goals Pain     Vasopneumatic   Number Minutes Vasopneumatic  15 minutes   Vasopnuematic Location  Shoulder   Vasopneumatic Pressure Medium   Vasopneumatic Temperature  3 flakes     Manual Therapy   Manual Therapy Passive ROM   Passive ROM left shoulder flexion, abd, ER 10x each                PT Education - 11/29/15 1647    Education provided  No          PT Short Term Goals - 11/25/15 1209      PT SHORT TERM GOAL #1   Title She will be independent with inital HEP    Status Achieved     PT SHORT TERM GOAL #2   Title report a 25% reduction in Lt shoulder pain with use at home and work   Status Achieved     PT SHORT TERM GOAL #3   Title demonstrate Lt shoulder AROM flexion to 145 to improve use with self-care and overhead reaching   Status Achieved           PT Long Term Goals - 11/29/15 1652      PT LONG TERM GOAL #1   Title be independent in advanced HEP   Time 8   Period Weeks   Status On-going     PT LONG TERM GOAL #2   Title improve FOTO to < or = to 46% limitation   Time 8   Period Weeks   Status On-going     PT LONG TERM GOAL #3   Title report a 60% reduction in Lt  shoulder pain with home tasks   Time 8   Period Weeks   Status Achieved     PT LONG TERM GOAL #4   Title demonstrate 4+/5 Lt shoulder strength throughout to improve endurance for Lt UE use   Time 8   Period Weeks   Status On-going  progressing slowly     PT LONG TERM GOAL #5   Title able to lift to waist and carry 10 pounds or more to increase home and work participation as MD allows   Time 8   Period Weeks   Status On-going     PT LONG TERM GOAL #6   Title demonstrate Lt shoulder IR to lacking < or = to 2 inches vs the Rt to improve self-care/dressing   Time 8   Period Weeks   Status On-going               Plan - 11/29/15 1648    Clinical Impression Statement Patient reports her pain is 90% better.  Patient was able to put her hair up for the first time.  Patient PROM is within functional limits.  Patient has pain at end range of PROM.  Patient was able to tolerate increase in weights today.  Patient will benefit form skilled therapy to improve left shoulder ROM and strength.    Rehab Potential Good   Clinical Impairments Affecting Rehab Potential 11/10/15 left arthroscopic SAD/DCR   PT Frequency 5x / week   PT Duration 8 weeks   PT Treatment/Interventions ADLs/Self Care Home Management;Cryotherapy;Electrical Stimulation;Iontophoresis 4mg /ml Dexamethasone;Moist Heat;Therapeutic exercise;Therapeutic activities;Functional mobility training;Ultrasound;Neuromuscular re-education;Patient/family education;Manual techniques;Taping;Dry needling;Passive range of motion;Vasopneumatic Device   PT Next Visit Plan ROM exercises, continue with edema management, continue with band exs. , scapular ROM, left shoulder PROM and AAROM; soft tissue work. Pt to continue with PT 5x next week.   PT Home Exercise Plan ROM exercises   Consulted and Agree with Plan of Care Patient      Patient will benefit from skilled therapeutic intervention in order to improve the following deficits and  impairments:  Postural dysfunction, Decreased strength, Impaired flexibility, Pain, Impaired UE functional use, Decreased activity tolerance, Decreased range of motion  Visit Diagnosis: Shoulder stiffness, left  Muscle weakness (generalized)  Pain in left shoulder     Problem List Patient Active  Problem List   Diagnosis Date Noted  . Insomnia 10/29/2014  . Plantar fasciitis 12/02/2013  . Benign essential HTN 05/28/2013  . Hepatic steatosis 04/12/2011  . Back pain 06/16/2010  . HEMORRHOIDS, EXTERNAL 12/21/2009  . DEPRESSION, MAJOR, MODERATE 04/04/2007  . HAIR LOSS 08/23/2006  . OBESITY NOS 05/28/2006  . FATIGUE 05/28/2006  . GLUCOSE INTOLERANCE 05/23/2006  . Migraine 05/23/2006    Eulis Foster, PT 11/29/15 4:55 PM   [facility] [address], [address] [place], [place], [address] Phone: [phone]   Fax:  [phone]  Name: JATIA MUSA MRN: [id number] Date of Birth: 11-Jul-1975

## 2015-11-30 ENCOUNTER — Ambulatory Visit: Payer: PRIVATE HEALTH INSURANCE

## 2015-11-30 DIAGNOSIS — M25612 Stiffness of left shoulder, not elsewhere classified: Secondary | ICD-10-CM | POA: Diagnosis not present

## 2015-11-30 DIAGNOSIS — M25512 Pain in left shoulder: Secondary | ICD-10-CM

## 2015-11-30 DIAGNOSIS — M6281 Muscle weakness (generalized): Secondary | ICD-10-CM

## 2015-11-30 NOTE — Therapy (Signed)
[facility] [address], [address] [place], [place], [address] Phone: [phone]   Fax:  [phone]  Physical Therapy Treatment  Patient Details  Name: Hailey ButteryFelicia G Nistler MRN: [id number] Date of Birth: 10/15/1975 Referring Provider: Valma Cavaollins, Andrew, MD  Encounter Date: 11/30/2015      PT End of Session - 11/30/15 1054    Visit Number 42   Number of Visits 47   Date for PT Re-Evaluation 01/06/16   Authorization Type Workers Comp   Authorization - Visit Number 42   Authorization - Number of Visits 47   PT Start Time 1017   PT Stop Time 1110   PT Time Calculation (min) 53 min   Activity Tolerance Patient tolerated treatment well   Behavior During Therapy WFL for tasks assessed/performed      Past Medical History:  Diagnosis Date  . Depression   . Diverticular disease     Past Surgical History:  Procedure Laterality Date  . ABDOMINAL HYSTERECTOMY    . ROTATOR CUFF REPAIR      There were no vitals filed for this visit.      Subjective Assessment - 11/30/15 1023    Subjective Pt reports that arm is feeling much better.     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  Lt shoulder surgery 12/14/2014 for partial rotator cuff repair and distal clavicle excision. March 15th injection into left shoulder under Ultrasound to break up scar tissue   Currently in Pain? Yes   Pain Score 2    Pain Location Shoulder   Pain Orientation Left   Pain Descriptors / Indicators Aching   Pain Type Surgical pain   Pain Onset More than a month ago                         [facility] Adult PT Treatment/Exercise - 11/30/15 0001      Shoulder Exercises: Seated   Flexion Strengthening;Left;20 reps     Shoulder Exercises: Standing   Extension Strengthening;Both;10 reps  15# on tower   Row Strengthening;Both;10 reps;Weights  15# with tower   Shoulder Elevation Strengthening;AAROM;Left;15 reps;Standing   using finger ladder     Shoulder Exercises: Pulleys   Flexion 3 minutes   ABduction 3 minutes     Shoulder Exercises: ROM/Strengthening   UBE (Upper Arm Bike) Level 1x 8 minutes   Other ROM/Strengthening Exercises large blue ball roll for flexion 30x     Modalities   Modalities Electrical Stimulation     Electrical Stimulation   Electrical Stimulation Location left shoulder   Electrical Stimulation Action IFC   Electrical Stimulation Parameters 15 minutes   Electrical Stimulation Goals Pain     Vasopneumatic   Number Minutes Vasopneumatic  15 minutes   Vasopnuematic Location  Shoulder   Vasopneumatic Pressure Medium   Vasopneumatic Temperature  3 flakes                PT Education - 11/29/15 1647    Education provided No          PT Short Term Goals - 11/25/15 1209      PT SHORT TERM GOAL #1   Title She will be independent with inital HEP    Status Achieved     PT SHORT TERM GOAL #2   Title report a 25% reduction in Lt shoulder pain with use at home and work   Status Achieved     PT SHORT TERM GOAL #  3   Title demonstrate Lt shoulder AROM flexion to 145 to improve use with self-care and overhead reaching   Status Achieved           PT Long Term Goals - 11/29/15 1652      PT LONG TERM GOAL #1   Title be independent in advanced HEP   Time 8   Period Weeks   Status On-going     PT LONG TERM GOAL #2   Title improve FOTO to < or = to 46% limitation   Time 8   Period Weeks   Status On-going     PT LONG TERM GOAL #3   Title report a 60% reduction in Lt shoulder pain with home tasks   Time 8   Period Weeks   Status Achieved     PT LONG TERM GOAL #4   Title demonstrate 4+/5 Lt shoulder strength throughout to improve endurance for Lt UE use   Time 8   Period Weeks   Status On-going  progressing slowly     PT LONG TERM GOAL #5   Title able to lift to waist and carry 10 pounds or more to increase home and work participation as MD allows    Time 8   Period Weeks   Status On-going     PT LONG TERM GOAL #6   Title demonstrate Lt shoulder IR to lacking < or = to 2 inches vs the Rt to improve self-care/dressing   Time 8   Period Weeks   Status On-going               Plan - 11/30/15 1024    Clinical Impression Statement Pt reports that pain is 90% better.  Pt is able to use Lt UE to do her hair.  PROM is WFLs this week with pain at end range.  Pt able to tolerate increased resistance with exercise in the clinic and movement against gravity.  Pt will benefit from skilled PT for Lt shoulder strength, AROM and movement against gravity to allow for return to normal function.     Rehab Potential Good   PT Frequency 5x / week   PT Duration 8 weeks   PT Treatment/Interventions ADLs/Self Care Home Management;Cryotherapy;Electrical Stimulation;Iontophoresis 4mg /ml Dexamethasone;Moist Heat;Therapeutic exercise;Therapeutic activities;Functional mobility training;Ultrasound;Neuromuscular re-education;Patient/family education;Manual techniques;Taping;Dry needling;Passive range of motion;Vasopneumatic Device   PT Next Visit Plan ROM exercises, continue with edema management, continue with band exs. , scapular ROM, left shoulder PROM and AAROM; soft tissue work. Pt to continue with PT 5x next week.   Consulted and Agree with Plan of Care Patient      Patient will benefit from skilled therapeutic intervention in order to improve the following deficits and impairments:  Postural dysfunction, Decreased strength, Impaired flexibility, Pain, Impaired UE functional use, Decreased activity tolerance, Decreased range of motion  Visit Diagnosis: Shoulder stiffness, left  Muscle weakness (generalized)  Pain in left shoulder     Problem List Patient Active Problem List   Diagnosis Date Noted  . Insomnia 10/29/2014  . Plantar fasciitis 12/02/2013  . Benign essential HTN 05/28/2013  . Hepatic steatosis 04/12/2011  . Back pain 06/16/2010   . HEMORRHOIDS, EXTERNAL 12/21/2009  . DEPRESSION, MAJOR, MODERATE 04/04/2007  . HAIR LOSS 08/23/2006  . OBESITY NOS 05/28/2006  . FATIGUE 05/28/2006  . GLUCOSE INTOLERANCE 05/23/2006  . Migraine 05/23/2006     [name], PT 11/30/15 10:56 AM  [facility] [address], [address]  [place], [place], [address] Phone: [phone]   Fax:  [phone]  Name: SADIYA DURAND MRN: [id number] Date of Birth: [birth year]

## 2015-12-02 ENCOUNTER — Ambulatory Visit: Payer: PRIVATE HEALTH INSURANCE | Admitting: Physical Therapy

## 2015-12-02 ENCOUNTER — Encounter: Payer: Self-pay | Admitting: Physical Therapy

## 2015-12-02 DIAGNOSIS — M25612 Stiffness of left shoulder, not elsewhere classified: Secondary | ICD-10-CM

## 2015-12-02 DIAGNOSIS — M25512 Pain in left shoulder: Secondary | ICD-10-CM

## 2015-12-02 DIAGNOSIS — M6281 Muscle weakness (generalized): Secondary | ICD-10-CM

## 2015-12-02 NOTE — Therapy (Signed)
[facility] [address], [address] [place], [place], [address] Phone: [phone]   Fax:  [phone]  Physical Therapy Treatment  Patient Details  Name: Hailey Hopkins MRN: [id number] Date of Birth: 02-Aug-1975 Referring Provider: Valma Cavaollins, Andrew, MD  Encounter Date: 12/02/2015      PT End of Session - 12/02/15 0805    Visit Number 43   Number of Visits 47   Date for PT Re-Evaluation 01/06/16   Authorization - Visit Number 43   Authorization - Number of Visits 47   PT Start Time 0804   PT Stop Time 0900   PT Time Calculation (min) 56 min   Activity Tolerance Patient tolerated treatment well   Behavior [facility] Therapy [facility] tasks assessed/performed      Past Medical History:  Diagnosis Date  . Depression   . Diverticular disease     Past Surgical History:  Procedure Laterality Date  . ABDOMINAL HYSTERECTOMY    . ROTATOR CUFF REPAIR      There were no vitals filed for this visit.      Subjective Assessment - 12/02/15 0807    Subjective No new complaints this AM.    Currently in Pain? Yes   Pain Score 2    Pain Location Shoulder   Pain Orientation Left   Pain Descriptors / Indicators Aching;Sore   Multiple Pain Sites No            [facility] PT Assessment - 12/02/15 0001      AROM   Left Shoulder Flexion 170 Degrees   Left Shoulder ABduction 135 Degrees   Left Shoulder Internal Rotation --  behind the back: reaches just above the belt line   Left Shoulder External Rotation 65 Degrees     Strength   Left Shoulder Flexion 3/5   Left Shoulder ABduction 3/5   Left Shoulder Internal Rotation 3/5   Left Shoulder External Rotation 3/5                     [facility] Adult PT Treatment/Exercise - 12/02/15 0001      Shoulder Exercises: Seated   External Rotation Strengthening;Both;20 reps;Theraband  2x 10      Shoulder Exercises: Standing   Extension Strengthening;Left;20 reps;Weights  2  x 10 single arm   Row Strengthening;Left;20 reps;Weights  2 x 10 single arm   Other Standing Exercises Finger ladder 10x, 10x scaption     Shoulder Exercises: Pulleys   Flexion 3 minutes   ABduction 3 minutes     Electrical Stimulation   Electrical Stimulation Location left shoulder   Electrical Stimulation Action IFC   Electrical Stimulation Parameters 15   Electrical Stimulation Goals Pain     Vasopneumatic   Number Minutes Vasopneumatic  15 minutes   Vasopnuematic Location  Shoulder   Vasopneumatic Pressure Medium   Vasopneumatic Temperature  3 flakes                PT Education - 12/02/15 0830    Education provided Yes   Education Details Yellow band external rotation for HEP   Person(s) Educated Patient   Methods Explanation;Demonstration;Tactile cues;Verbal cues   Comprehension Verbalized understanding;Returned demonstration          PT Short Term Goals - 11/25/15 1209      PT SHORT TERM GOAL #1   Title She will be independent with inital HEP    Status Achieved     PT SHORT TERM GOAL #  2   Title report a 25% reduction in Lt shoulder pain with use at home and work   Status Achieved     PT SHORT TERM GOAL #3   Title demonstrate Lt shoulder AROM flexion to 145 to improve use with self-care and overhead reaching   Status Achieved           PT Long Term Goals - 12/02/15 1610      PT LONG TERM GOAL #1   Title be independent in advanced HEP   Time 8   Period Weeks   Status On-going     PT LONG TERM GOAL #3   Title report a 60% reduction in Lt shoulder pain with home tasks   Baseline more painful in the last week   Time 8   Period Weeks   Status Achieved     PT LONG TERM GOAL #4   Time 8   Period Weeks   Status On-going  3/5     PT LONG TERM GOAL #5   Title able to lift to waist and carry 10 pounds or more to increase home and work participation as MD allows   Time 8   Period Weeks   Status On-going               Plan -  12/02/15 0806    Clinical Impression Statement Pain 90% better, as per last report doing hair, getting dressed and other ADLs are significantly improved. See ROM measurements in note.     Rehab Potential Good   Clinical Impairments Affecting Rehab Potential 11/10/15 left arthroscopic SAD/DCR   PT Frequency 5x / week   PT Duration 8 weeks   PT Treatment/Interventions ADLs/Self Care Home Management;Cryotherapy;Electrical Stimulation;Iontophoresis 4mg /ml Dexamethasone;Moist Heat;Therapeutic exercise;Therapeutic activities;Functional mobility training;Ultrasound;Neuromuscular re-education;Patient/family education;Manual techniques;Taping;Dry needling;Passive range of motion;Vasopneumatic Device   PT Next Visit Plan Seeing MD today.   Consulted and Agree with Plan of Care Patient      Patient will benefit from skilled therapeutic intervention in order to improve the following deficits and impairments:  Postural dysfunction, Decreased strength, Impaired flexibility, Pain, Impaired UE functional use, Decreased activity tolerance, Decreased range of motion  Visit Diagnosis: Shoulder stiffness, left  Muscle weakness (generalized)  Pain in left shoulder     Problem List Patient Active Problem List   Diagnosis Date Noted  . Insomnia 10/29/2014  . Plantar fasciitis 12/02/2013  . Benign essential HTN 05/28/2013  . Hepatic steatosis 04/12/2011  . Back pain 06/16/2010  . HEMORRHOIDS, EXTERNAL 12/21/2009  . DEPRESSION, MAJOR, MODERATE 04/04/2007  . HAIR LOSS 08/23/2006  . OBESITY NOS 05/28/2006  . FATIGUE 05/28/2006  . GLUCOSE INTOLERANCE 05/23/2006  . Migraine 05/23/2006    Hailey Hopkins, Hailey Hopkins 12/02/2015, 8:47 AM  [facility] [address], [address] [place], [place], [address] Phone: [phone]   Fax:  [phone]  Name: Hailey Hopkins MRN: [id number] Date of Birth: October 08, 1975

## 2015-12-05 ENCOUNTER — Encounter: Payer: Self-pay | Admitting: Physical Therapy

## 2015-12-05 ENCOUNTER — Ambulatory Visit: Payer: PRIVATE HEALTH INSURANCE | Admitting: Physical Therapy

## 2015-12-05 DIAGNOSIS — M6281 Muscle weakness (generalized): Secondary | ICD-10-CM

## 2015-12-05 DIAGNOSIS — M25512 Pain in left shoulder: Secondary | ICD-10-CM

## 2015-12-05 DIAGNOSIS — M25612 Stiffness of left shoulder, not elsewhere classified: Secondary | ICD-10-CM

## 2015-12-05 NOTE — Therapy (Signed)
[facility] [address], [address] [place], [place], [address] Phone: [phone]   Fax:  [phone]  Physical Therapy Treatment  Patient Details  Name: Hailey Hopkins MRN: [id number] Date of Birth: Aug 22, 1975 Referring Provider: Valma Cavaollins, Andrew, MD  Encounter Date: 12/05/2015      PT End of Session - 12/05/15 1542    Visit Number 44   Number of Visits 47   Date for PT Re-Evaluation 01/06/16   Authorization Type Workers Comp   Authorization - Visit Number 44   Authorization - Number of Visits 47   PT Start Time 1530   PT Stop Time 1630   PT Time Calculation (min) 60 min   Activity Tolerance Patient tolerated treatment well   Behavior During Therapy [facility] for tasks assessed/performed      Past Medical History:  Diagnosis Date  . Depression   . Diverticular disease     Past Surgical History:  Procedure Laterality Date  . ABDOMINAL HYSTERECTOMY    . ROTATOR CUFF REPAIR      There were no vitals filed for this visit.      Subjective Assessment - 12/05/15 1531    Subjective I am achy today. I saw the doctor and he is happy with my progress.  I have  the motion now he wants me to get the strength.    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  Lt shoulder surgery 12/14/2014 for partial rotator cuff repair and distal clavicle excision. March 15th injection into left shoulder under Ultrasound to break up scar tissue   Diagnostic tests injection: Cortizone 04/2015   Patient Stated Goals reduce pain, return to normal use of Lt UE   Currently in Pain? Yes   Pain Score 2    Pain Location Shoulder   Pain Orientation Left   Pain Descriptors / Indicators Aching;Sore   Pain Type Surgical pain   Pain Onset More than a month ago   Pain Frequency Constant   Aggravating Factors  movement   Pain Relieving Factors ice   Multiple Pain Sites No            [facility] PT Assessment - 12/05/15 0001      AROM    Left Shoulder ABduction 160 Degrees   Left Shoulder Internal Rotation --  T10                     [facility] Adult PT Treatment/Exercise - 12/05/15 0001      Shoulder Exercises: Standing   Flexion Strengthening;Both;10 reps  facing wall; tactile cues to move   ABduction Strengthening;Both;10 reps  face wall with tactile cues   Extension Strengthening;Left;20 reps;Weights  2 x 10 tower 15#   Row Strengthening;Left;20 reps;Weights  2 x 10 20# tower   Other Standing Exercises shoulder internally rotated reaching behind her back with manual stretch   Other Standing Exercises hold red plyoball move midrange up/down, side to side, diagonals 10x2 each     Shoulder Exercises: Pulleys   Flexion 3 minutes   ABduction 3 minutes     Shoulder Exercises: ROM/Strengthening   UBE (Upper Arm Bike) level 1 3 min forward/3 min backward with exagerated  movement     Shoulder Exercises: Stretch   Other Shoulder Stretches standing left tricep stretch over head; left shoulder pectoralis stretch with therapist assistance     Modalities   Modalities Electrical Stimulation;Vasopneumatic     Electrical Stimulation   Electrical Stimulation Location left  shoulder   Electrical Stimulation Action IFC   Electrical Stimulation Parameters 15 min to patient tolerance   Electrical Stimulation Goals Pain     Vasopneumatic   Number Minutes Vasopneumatic  15 minutes   Vasopnuematic Location  Shoulder   Vasopneumatic Pressure Medium   Vasopneumatic Temperature  3 flakes                PT Education - 12/05/15 1542    Education provided No          PT Short Term Goals - 11/25/15 1209      PT SHORT TERM GOAL #1   Title She will be independent with inital HEP    Status Achieved     PT SHORT TERM GOAL #2   Title report a 25% reduction in Lt shoulder pain with use at home and work   Status Achieved     PT SHORT TERM GOAL #3   Title demonstrate Lt shoulder AROM flexion to 145 to  improve use with self-care and overhead reaching   Status Achieved           PT Long Term Goals - 12/05/15 1542      PT LONG TERM GOAL #1   Title be independent in advanced HEP   Time 8   Period Weeks   Status On-going     PT LONG TERM GOAL #2   Title improve FOTO to < or = to 46% limitation   Time 8   Period Weeks   Status On-going     PT LONG TERM GOAL #3   Title report a 60% reduction in Lt shoulder pain with home tasks   Time 8   Period Weeks   Status Achieved  90% better     PT LONG TERM GOAL #4   Title demonstrate 4+/5 Lt shoulder strength throughout to improve endurance for Lt UE use   Time 8   Period Weeks   Status On-going  3/5     PT LONG TERM GOAL #5   Title able to lift to waist and carry 10 pounds or more to increase home and work participation as MD allows   Baseline able to do in clinic    Time 8   Period Weeks   Status On-going     PT LONG TERM GOAL #6   Title demonstrate Lt shoulder IR to lacking < or = to 2 inches vs the Rt to improve self-care/dressing   Time 8   Period Weeks   Status On-going               Plan - 12/05/15 1604    Clinical Impression Statement Patient pain is 90% better.  Patient has trouble with fully movement of left shoulder girdle with flexion and abduction.  Patient able to reach to T10.  Left shoulder abduction increased to 160 actively. Patient  has been working with overhead movements to increase strength and endurance.  Patient will benefit form skileed therapy to increased left shoulder ROM and strength.    Rehab Potential Good   Clinical Impairments Affecting Rehab Potential 11/10/15 left arthroscopic SAD/DCR   PT Frequency 5x / week   PT Duration 8 weeks   PT Treatment/Interventions ADLs/Self Care Home Management;Cryotherapy;Electrical Stimulation;Iontophoresis 4mg /ml Dexamethasone;Moist Heat;Therapeutic exercise;Therapeutic activities;Functional mobility training;Ultrasound;Neuromuscular  re-education;Patient/family education;Manual techniques;Taping;Dry needling;Passive range of motion;Vasopneumatic Device   PT Next Visit Plan work with overhead movements and midline stabilization; work on lifting a 10#box   PT Home Exercise Plan  progress as needed   Consulted and Agree with Plan of Care Patient      Patient will benefit from skilled therapeutic intervention in order to improve the following deficits and impairments:  Postural dysfunction, Decreased strength, Impaired flexibility, Pain, Impaired UE functional use, Decreased activity tolerance, Decreased range of motion  Visit Diagnosis: Shoulder stiffness, left  Muscle weakness (generalized)  Pain in left shoulder     Problem List Patient Active Problem List   Diagnosis Date Noted  . Insomnia 10/29/2014  . Plantar fasciitis 12/02/2013  . Benign essential HTN 05/28/2013  . Hepatic steatosis 04/12/2011  . Back pain 06/16/2010  . HEMORRHOIDS, EXTERNAL 12/21/2009  . DEPRESSION, MAJOR, MODERATE 04/04/2007  . HAIR LOSS 08/23/2006  . OBESITY NOS 05/28/2006  . FATIGUE 05/28/2006  . GLUCOSE INTOLERANCE 05/23/2006  . Migraine 05/23/2006    Eulis Foster, PT 12/05/15 4:15 PM    [facility] [address], [address] [place], [place], [address] Phone: [phone]   Fax:  [phone]  Name: Hailey Hopkins MRN: [id number] Date of Birth: January 11, 1976

## 2015-12-06 ENCOUNTER — Encounter: Payer: Self-pay | Admitting: Physical Therapy

## 2015-12-07 ENCOUNTER — Ambulatory Visit: Payer: PRIVATE HEALTH INSURANCE | Admitting: Physical Therapy

## 2015-12-08 ENCOUNTER — Encounter: Payer: Self-pay | Admitting: Physical Therapy

## 2015-12-09 ENCOUNTER — Ambulatory Visit: Payer: PRIVATE HEALTH INSURANCE | Admitting: Physical Therapy

## 2015-12-09 DIAGNOSIS — M25612 Stiffness of left shoulder, not elsewhere classified: Secondary | ICD-10-CM

## 2015-12-09 DIAGNOSIS — M25512 Pain in left shoulder: Secondary | ICD-10-CM

## 2015-12-09 DIAGNOSIS — M6281 Muscle weakness (generalized): Secondary | ICD-10-CM

## 2015-12-09 NOTE — Therapy (Signed)
[facility] [address] [place], [place], [address] Phone: [phone]   Fax:  [phone]  Physical Therapy Treatment  Patient Details  Name: Hailey Hopkins MRN: [id number] Date of Birth: 20-Jun-1975 Referring Provider: Valma Cava, MD  Encounter Date: 12/09/2015      PT End of Session - 12/09/15 1143    Visit Number 45   Number of Visits 47   Date for PT Re-Evaluation 01/06/16   Authorization Type Workers Comp   Authorization - Visit Number 45   Authorization - Number of Visits 47   Activity Tolerance Patient limited by pain      Past Medical History:  Diagnosis Date  . Depression   . Diverticular disease     Past Surgical History:  Procedure Laterality Date  . ABDOMINAL HYSTERECTOMY    . ROTATOR CUFF REPAIR      There were no vitals filed for this visit.      Subjective Assessment - 12/09/15 1111    Subjective Patient reports she had increased pain and difficulty sleeping after last visit which she attributes to overhead stretching.     Currently in Pain? Yes   Pain Score 5    Pain Location Shoulder   Pain Orientation Left   Pain Type Surgical pain                         [facility] Adult PT Treatment/Exercise - 12/09/15 0001      Shoulder Exercises: Prone   Horizontal ABduction 1 Strengthening;Left;20 reps   Horizontal ABduction 2 Strengthening;Left;20 reps     Shoulder Exercises: Standing   External Rotation Strengthening;Left;20 reps;Theraband   Theraband Level (Shoulder External Rotation) Level 2 (Red)   Internal Rotation Strengthening;Left;20 reps;Theraband   Theraband Level (Shoulder Internal Rotation) Level 1 (Yellow)   Internal Rotation Limitations lowered resistance to yellow secondary to pain   Extension Strengthening;Left;20 reps;Theraband   Theraband Level (Shoulder Extension) Level 2 (Red)   Row Left;20 reps;Theraband   Theraband Level (Shoulder Row)  Level 2 (Red)   Other Standing Exercises 1# flexion and scaption 10x each with mirror biofeedback to avoid shoulder hike     Shoulder Exercises: Pulleys   Flexion 3 minutes   ABduction 3 minutes   Other Pulley Exercises IR with pulleys 3 min      Electrical Stimulation   Electrical Stimulation Location left shoulder   Electrical Stimulation Action IFC   Electrical Stimulation Parameters 15 min to patient tolerance   Electrical Stimulation Goals Pain     Vasopneumatic   Number Minutes Vasopneumatic  15 minutes   Vasopnuematic Location  Shoulder   Vasopneumatic Pressure Medium   Vasopneumatic Temperature  3 flakes                  PT Short Term Goals - 12/09/15 1147      PT SHORT TERM GOAL #1   Title She will be independent with inital HEP    Status Achieved     PT SHORT TERM GOAL #2   Title report a 25% reduction in Lt shoulder pain with use at home and work   Status Achieved     PT SHORT TERM GOAL #3   Title demonstrate Lt shoulder AROM flexion to 145 to improve use with self-care and overhead reaching   Status Achieved           PT Long Term Goals - 12/09/15 1147  PT LONG TERM GOAL #1   Title be independent in advanced HEP   Time 8   Period Weeks   Status On-going     PT LONG TERM GOAL #2   Title improve FOTO to < or = to 46% limitation   Time 8   Period Weeks   Status On-going     PT LONG TERM GOAL #3   Title report a 60% reduction in Lt shoulder pain with home tasks   Status Achieved     PT LONG TERM GOAL #4   Title demonstrate 4+/5 Lt shoulder strength throughout to improve endurance for Lt UE use   Time 8   Period Weeks   Status On-going     PT LONG TERM GOAL #5   Title able to lift to waist and carry 10 pounds or more to increase home and work participation as MD allows   Time 8   Period Weeks   Status On-going     PT LONG TERM GOAL #6   Title demonstrate Lt shoulder IR to lacking < or = to 2 inches vs the Rt to improve  self-care/dressing   Time 8   Period Weeks   Status On-going               Plan - 12/09/15 1143    Clinical Impression Statement Decreased ex intensity secondary to patient's complaints of increased pain today.  She is able to participate in low to moderate level strengthening but with discomfort so many exercises modified to be a small arc of movement.  The patient asks about dry needling as another adjunct to treatment.  She may benefit from DN in upper traps and periscapular muscles but would need prior approval from her MD since <3 months post op.     PT Next Visit Plan continue/resume progressive strengthening;  modalities as needed for pain control      Patient will benefit from skilled therapeutic intervention in order to improve the following deficits and impairments:     Visit Diagnosis: Shoulder stiffness, left  Muscle weakness (generalized)  Pain in left shoulder     Problem List Patient Active Problem List   Diagnosis Date Noted  . Insomnia 10/29/2014  . Plantar fasciitis 12/02/2013  . Benign essential HTN 05/28/2013  . Hepatic steatosis 04/12/2011  . Back pain 06/16/2010  . HEMORRHOIDS, EXTERNAL 12/21/2009  . DEPRESSION, MAJOR, MODERATE 04/04/2007  . HAIR LOSS 08/23/2006  . OBESITY NOS 05/28/2006  . FATIGUE 05/28/2006  . GLUCOSE INTOLERANCE 05/23/2006  . Migraine 05/23/2006   [name], PT 12/09/15 11:49 AM Phone: [phone] Fax: [phone]  [name], [name] 12/09/2015, 11:49 AM  [facility] [address], [address] [place], [place], [address] Phone: [phone]   Fax:  [phone]  Name: Hailey Hopkins MRN: [id number] Date of Birth: 03/08/1976

## 2015-12-12 ENCOUNTER — Ambulatory Visit: Payer: PRIVATE HEALTH INSURANCE | Admitting: Physical Therapy

## 2015-12-12 ENCOUNTER — Encounter: Payer: Self-pay | Admitting: Physical Therapy

## 2015-12-12 DIAGNOSIS — M6281 Muscle weakness (generalized): Secondary | ICD-10-CM

## 2015-12-12 DIAGNOSIS — M25612 Stiffness of left shoulder, not elsewhere classified: Secondary | ICD-10-CM | POA: Diagnosis not present

## 2015-12-12 DIAGNOSIS — G8929 Other chronic pain: Secondary | ICD-10-CM

## 2015-12-12 DIAGNOSIS — M25512 Pain in left shoulder: Secondary | ICD-10-CM

## 2015-12-12 NOTE — Therapy (Signed)
[facility] [address] [place], [place], [address] Phone: [phone]   Fax:  [phone]  Physical Therapy Treatment  Patient Details  Name: Hailey Hopkins MRN: [id number] Date of Birth: May 29, 1975 Referring Provider: Valma Cava, MD  Encounter Date: 12/12/2015      PT End of Session - 12/12/15 1051    Visit Number 46   Number of Visits 47   Date for PT Re-Evaluation 01/06/16   Authorization Type Workers Comp   Authorization - Visit Number 46   Authorization - Number of Visits 47   PT Start Time 1012   PT Stop Time 1056   PT Time Calculation (min) 44 min   Activity Tolerance Patient limited by pain   Behavior During Therapy [facility] for tasks assessed/performed      Past Medical History:  Diagnosis Date  . Depression   . Diverticular disease     Past Surgical History:  Procedure Laterality Date  . ABDOMINAL HYSTERECTOMY    . ROTATOR CUFF REPAIR      There were no vitals filed for this visit.      Subjective Assessment - 12/12/15 1013    Subjective Pt reports a litte soreness today.    Currently in Pain? Yes   Pain Score 3    Pain Orientation Left   Pain Descriptors / Indicators Aching;Sore   Pain Type Surgical pain                         [facility] Adult PT Treatment/Exercise - 12/12/15 0001      Shoulder Exercises: Prone   Flexion Strengthening;Left;20 reps   Extension Strengthening;Left;20 reps   Horizontal ABduction 1 Strengthening;Left;20 reps   Horizontal ABduction 2 --     Shoulder Exercises: Standing   External Rotation Strengthening;Left;20 reps;Theraband   Theraband Level (Shoulder External Rotation) Level 2 (Red)   Internal Rotation Strengthening;Left;20 reps;Theraband   Theraband Level (Shoulder Internal Rotation) Level 2 (Red)   Flexion --   Extension Strengthening;Left;20 reps;Theraband   Theraband Level (Shoulder Extension) Level 2 (Red)   Row Left;20  reps;Theraband   Theraband Level (Shoulder Row) Level 2 (Red)   Other Standing Exercises 1# flexion and scaption 10x each with mirror biofeedback to avoid shoulder hike     Shoulder Exercises: Pulleys   Flexion 3 minutes   ABduction 3 minutes   Other Pulley Exercises IR with pulleys 3 min      Shoulder Exercises: ROM/Strengthening   UBE (Upper Arm Bike) level 1 4 min forward/4 min backward with exagerated  movement  While therapist assesing progress     Modalities   Modalities --  Pt declined modalities today                  PT Short Term Goals - 12/12/15 1013      PT SHORT TERM GOAL #1   Title She will be independent with inital HEP    Baseline can reproduce correctly without cues.   Time 4   Period Weeks   Status Achieved     PT SHORT TERM GOAL #2   Title report a 25% reduction in Lt shoulder pain with use at home and work   Baseline AA arm to fix hair   Time 4   Period Weeks   Status Achieved     PT SHORT TERM GOAL #3   Title demonstrate Lt shoulder AROM flexion to 145 to improve use with  self-care and overhead reaching   Baseline understands and uses RICE at home   Time 4   Period Weeks   Status Achieved           PT Long Term Goals - 12/12/15 1014      PT LONG TERM GOAL #1   Title be independent in advanced HEP   Time 8   Period Weeks   Status On-going     PT LONG TERM GOAL #2   Title improve FOTO to < or = to 46% limitation   Time 8   Period Weeks   Status On-going     PT LONG TERM GOAL #3   Title report a 60% reduction in Lt shoulder pain with home tasks   Time 8   Period Weeks   Status Achieved     PT LONG TERM GOAL #4   Title demonstrate 4+/5 Lt shoulder strength throughout to improve endurance for Lt UE use   Baseline Pain with endrange left shoulder flexion   Time 8   Period Weeks   Status On-going     PT LONG TERM GOAL #5   Title able to lift to waist and carry 10 pounds or more to increase home and work participation  as MD allows   Baseline able to do in clinic    Time 8   Period Weeks   Status On-going     PT LONG TERM GOAL #6   Title demonstrate Lt shoulder IR to lacking < or = to 2 inches vs the Rt to improve self-care/dressing   Time 8   Period Weeks   Status On-going               Plan - 12/12/15 1049    Clinical Impression Statement Pt able to complete all exercises well with minmal verbal cues for shoulder hiking. Will continue to increase resistance as tolerated.    Rehab Potential Good   Clinical Impairments Affecting Rehab Potential 11/10/15 left arthroscopic SAD/DCR   PT Frequency 5x / week   PT Duration 8 weeks   PT Treatment/Interventions ADLs/Self Care Home Management;Cryotherapy;Electrical Stimulation;Iontophoresis 4mg /ml Dexamethasone;Moist Heat;Therapeutic exercise;Therapeutic activities;Functional mobility training;Ultrasound;Neuromuscular re-education;Patient/family education;Manual techniques;Taping;Dry needling;Passive range of motion;Vasopneumatic Device   PT Next Visit Plan continue/resume progressive strengthening;  modalities as needed for pain control   PT Home Exercise Plan progress as needed   Consulted and Agree with Plan of Care Patient      Patient will benefit from skilled therapeutic intervention in order to improve the following deficits and impairments:  Postural dysfunction, Decreased strength, Impaired flexibility, Pain, Impaired UE functional use, Decreased activity tolerance, Decreased range of motion  Visit Diagnosis: Shoulder stiffness, left  Muscle weakness (generalized)  Pain in left shoulder  Muscle weakness of left arm  Chronic pain in left shoulder     Problem List Patient Active Problem List   Diagnosis Date Noted  . Insomnia 10/29/2014  . Plantar fasciitis 12/02/2013  . Benign essential HTN 05/28/2013  . Hepatic steatosis 04/12/2011  . Back pain 06/16/2010  . HEMORRHOIDS, EXTERNAL 12/21/2009  . DEPRESSION, MAJOR, MODERATE  04/04/2007  . HAIR LOSS 08/23/2006  . OBESITY NOS 05/28/2006  . FATIGUE 05/28/2006  . GLUCOSE INTOLERANCE 05/23/2006  . Migraine 05/23/2006    Dessa PhiKatherine Matthews PTA 12/12/2015, 11:42 AM  [facility] [address], [address] [place], [place], [address] Phone: [phone]   Fax:  [phone]  Name: Hailey Hopkins MRN: [id number] Date of Birth:  10/31/1975    

## 2015-12-14 ENCOUNTER — Encounter: Payer: Self-pay | Admitting: Physical Therapy

## 2015-12-14 ENCOUNTER — Ambulatory Visit: Payer: PRIVATE HEALTH INSURANCE | Admitting: Physical Therapy

## 2015-12-14 DIAGNOSIS — M25512 Pain in left shoulder: Secondary | ICD-10-CM

## 2015-12-14 DIAGNOSIS — M6281 Muscle weakness (generalized): Secondary | ICD-10-CM

## 2015-12-14 DIAGNOSIS — M25612 Stiffness of left shoulder, not elsewhere classified: Secondary | ICD-10-CM

## 2015-12-14 DIAGNOSIS — G8929 Other chronic pain: Secondary | ICD-10-CM

## 2015-12-14 NOTE — Therapy (Signed)
[facility] [address] [place], [place], [address] Phone: [phone]   Fax:  [phone]  Physical Therapy Treatment  Patient Details  Name: Hailey Hopkins MRN: [id number] Date of Birth: May 05, 1975 Referring Provider: Valma Cava, MD  Encounter Date: 12/14/2015      PT End of Session - 12/14/15 1134    Visit Number 47   Date for PT Re-Evaluation 01/06/16   Authorization Type Workers Comp   Authorization - Visit Number 47   PT Start Time 1101   PT Stop Time 1200   PT Time Calculation (min) 59 min   Activity Tolerance Patient limited by pain   Behavior During Therapy [facility] for tasks assessed/performed      Past Medical History:  Diagnosis Date  . Depression   . Diverticular disease     Past Surgical History:  Procedure Laterality Date  . ABDOMINAL HYSTERECTOMY    . ROTATOR CUFF REPAIR      There were no vitals filed for this visit.      Subjective Assessment - 12/14/15 1103    Subjective Pt reports some soreness today but feeling good.    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  Lt shoulder surgery 12/14/2014 for partial rotator cuff repair and distal clavicle excision. March 15th injection into left shoulder under Ultrasound to break up scar tissue   Diagnostic tests injection: Cortizone 04/2015   Patient Stated Goals reduce pain, return to normal use of Lt UE   Currently in Pain? Yes   Pain Score 2    Pain Location Shoulder   Pain Orientation Left   Pain Descriptors / Indicators Aching;Sore   Pain Type Surgical pain   Pain Onset More than a month ago   Multiple Pain Sites No                         [facility] Adult PT Treatment/Exercise - 12/14/15 0001      Shoulder Exercises: Prone   Flexion Strengthening;Left;20 reps   Extension Strengthening;Left;20 reps   Horizontal ABduction 1 Strengthening;Left;20 reps     Shoulder Exercises: Standing   External Rotation Strengthening;Left;20 reps;Theraband   Theraband Level (Shoulder External Rotation) Level 2 (Red)   Internal Rotation Strengthening;Left;20 reps;Theraband   Theraband Level (Shoulder Internal Rotation) Level 2 (Red)   Extension Strengthening;Left;20 reps;Theraband   Theraband Level (Shoulder Extension) Level 2 (Red)   Row Left;20 reps;Theraband   Theraband Level (Shoulder Row) Level 3 (Green)   Other Standing Exercises 1# flexion and scaption 10x each with mirror biofeedback to avoid shoulder hike     Shoulder Exercises: Pulleys   Flexion 3 minutes   ABduction 3 minutes   Other Pulley Exercises IR with pulleys 3 min      Shoulder Exercises: ROM/Strengthening   UBE (Upper Arm Bike) level 1 4 min forward/4 min backward with exagerated  movement  While therapist assesing progress     Electrical Stimulation   Electrical Stimulation Location left shoulder   Electrical Stimulation Action IFC   Electrical Stimulation Parameters to patient toelrance   Electrical Stimulation Goals Pain     Vasopneumatic   Number Minutes Vasopneumatic  15 minutes   Vasopnuematic Location  Shoulder   Vasopneumatic Pressure Medium   Vasopneumatic Temperature  3 snow flakes                  PT Short Term Goals - 12/12/15 1013  PT SHORT TERM GOAL #1   Title She will be independent with inital HEP    Baseline can reproduce correctly without cues.   Time 4   Period Weeks   Status Achieved     PT SHORT TERM GOAL #2   Title report a 25% reduction in Lt shoulder pain with use at home and work   Baseline AA arm to fix hair   Time 4   Period Weeks   Status Achieved     PT SHORT TERM GOAL #3   Title demonstrate Lt shoulder AROM flexion to 145 to improve use with self-care and overhead reaching   Baseline understands and uses RICE at home   Time 4   Period Weeks   Status Achieved           PT Long Term Goals - 12/12/15 1014      PT LONG TERM GOAL #1   Title be  independent in advanced HEP   Time 8   Period Weeks   Status On-going     PT LONG TERM GOAL #2   Title improve FOTO to < or = to 46% limitation   Time 8   Period Weeks   Status On-going     PT LONG TERM GOAL #3   Title report a 60% reduction in Lt shoulder pain with home tasks   Time 8   Period Weeks   Status Achieved     PT LONG TERM GOAL #4   Title demonstrate 4+/5 Lt shoulder strength throughout to improve endurance for Lt UE use   Baseline Pain with endrange left shoulder flexion   Time 8   Period Weeks   Status On-going     PT LONG TERM GOAL #5   Title able to lift to waist and carry 10 pounds or more to increase home and work participation as MD allows   Baseline able to do in clinic    Time 8   Period Weeks   Status On-going     PT LONG TERM GOAL #6   Title demonstrate Lt shoulder IR to lacking < or = to 2 inches vs the Rt to improve self-care/dressing   Time 8   Period Weeks   Status On-going               Plan - 12/14/15 1135    Clinical Impression Statement Pt able tocomplete all exercises with minimal verbl cues for shoulder hiking. Pt continues to be limited in strengthening by pain with increased resistance. Will continue to increase shoulder strength and stability as tolerated.    Rehab Potential Good   Clinical Impairments Affecting Rehab Potential 11/10/15 left arthroscopic SAD/DCR   PT Frequency 5x / week   PT Duration 8 weeks   PT Treatment/Interventions ADLs/Self Care Home Management;Cryotherapy;Electrical Stimulation;Iontophoresis 4mg /ml Dexamethasone;Moist Heat;Therapeutic exercise;Therapeutic activities;Functional mobility training;Ultrasound;Neuromuscular re-education;Patient/family education;Manual techniques;Taping;Dry needling;Passive range of motion;Vasopneumatic Device   Consulted and Agree with Plan of Care Patient      Patient will benefit from skilled therapeutic intervention in order to improve the following deficits and  impairments:  Postural dysfunction, Decreased strength, Impaired flexibility, Pain, Impaired UE functional use, Decreased activity tolerance, Decreased range of motion  Visit Diagnosis: Shoulder stiffness, left  Muscle weakness (generalized)  Pain in left shoulder  Muscle weakness of left arm  Chronic pain in left shoulder     Problem List Patient Active Problem List   Diagnosis Date Noted  . Insomnia 10/29/2014  . Plantar fasciitis  12/02/2013  . Benign essential HTN 05/28/2013  . Hepatic steatosis 04/12/2011  . Back pain 06/16/2010  . HEMORRHOIDS, EXTERNAL 12/21/2009  . DEPRESSION, MAJOR, MODERATE 04/04/2007  . HAIR LOSS 08/23/2006  . OBESITY NOS 05/28/2006  . FATIGUE 05/28/2006  . GLUCOSE INTOLERANCE 05/23/2006  . Migraine 05/23/2006    Dessa Phi PTA 12/14/2015, 1:07 PM  [facility] [address] [place], [place], [address] Phone: [phone]   Fax:  [phone]  Name: REKIA KUJALA MRN: [id number] Date of Birth: 04-Nov-1975

## 2015-12-15 ENCOUNTER — Ambulatory Visit: Payer: PRIVATE HEALTH INSURANCE | Admitting: Physical Therapy

## 2015-12-15 DIAGNOSIS — M25512 Pain in left shoulder: Secondary | ICD-10-CM

## 2015-12-15 DIAGNOSIS — M25612 Stiffness of left shoulder, not elsewhere classified: Secondary | ICD-10-CM | POA: Diagnosis not present

## 2015-12-15 DIAGNOSIS — M6281 Muscle weakness (generalized): Secondary | ICD-10-CM

## 2015-12-15 NOTE — Therapy (Signed)
[facility] [address], [address] [place], [place], [address] Phone: [phone]   Fax:  [phone]  Physical Therapy Treatment  Patient Details  Name: Hailey Hopkins MRN: [id number] Date of Birth: 1975/12/06 Referring Provider: Valma Cavaollins, Andrew, MD  Encounter Date: 12/15/2015      PT End of Session - 12/15/15 1252    Visit Number 48   Number of Visits 57   Date for PT Re-Evaluation 01/06/16   Authorization Type Workers Comp   Authorization - Visit Number 48   Authorization - Number of Visits 57   PT Start Time 1230   PT Stop Time 1310   PT Time Calculation (min) 40 min   Activity Tolerance Patient tolerated treatment well      Past Medical History:  Diagnosis Date  . Depression   . Diverticular disease     Past Surgical History:  Procedure Laterality Date  . ABDOMINAL HYSTERECTOMY    . ROTATOR CUFF REPAIR      There were no vitals filed for this visit.      Subjective Assessment - 12/15/15 1235    Subjective No sharp pain just some soreness.  Patient has to get her son to an appointment following session.   Currently in Pain? Yes   Pain Score 2    Pain Location Shoulder   Pain Orientation Left   Pain Type Surgical pain   Pain Frequency Constant                         [facility] Adult PT Treatment/Exercise - 12/15/15 0001      Shoulder Exercises: Standing   Extension Strengthening;Left;20 reps;Theraband   Theraband Level (Shoulder Extension) Level 3 (Green)   Row MotorolaLeft;20 reps;Theraband   Theraband Level (Shoulder Row) Level 3 (Green)   Other Standing Exercises 1# flexion, abduction and scaption 10x each with mirror biofeedback to avoid shoulder hike   Other Standing Exercises green band diagonal extensions D1/D2 15x     Shoulder Exercises: Therapy Ball   Other Therapy Ball Exercises beach ball rolls flexion on stair railing 20x   Other Therapy Ball Exercises red ball rolls on wall  20x     Shoulder Exercises: ROM/Strengthening   Other ROM/Strengthening Exercises Body blade 3 positions 30 sec each                  PT Short Term Goals - 12/15/15 1326      PT SHORT TERM GOAL #1   Title She will be independent with inital HEP    Status Achieved     PT SHORT TERM GOAL #2   Title report a 25% reduction in Lt shoulder pain with use at home and work   Status Achieved     PT SHORT TERM GOAL #3   Title demonstrate Lt shoulder AROM flexion to 145 to improve use with self-care and overhead reaching   Status Achieved           PT Long Term Goals - 12/15/15 1326      PT LONG TERM GOAL #1   Title be independent in advanced HEP   Time 8   Period Weeks   Status On-going     PT LONG TERM GOAL #2   Title improve FOTO to < or = to 46% limitation   Time 8   Period Weeks   Status On-going     PT LONG TERM GOAL #3  Title report a 60% reduction in Lt shoulder pain with home tasks   Status Achieved     PT LONG TERM GOAL #4   Title demonstrate 4+/5 Lt shoulder strength throughout to improve endurance for Lt UE use   Time 8   Period Weeks   Status On-going     PT LONG TERM GOAL #5   Title able to lift to waist and carry 10 pounds or more to increase home and work participation as MD allows   Time 8   Period Weeks   Status On-going     PT LONG TERM GOAL #6   Title demonstrate Lt shoulder IR to lacking < or = to 2 inches vs the Rt to improve self-care/dressing   Time 8   Period Weeks   Status On-going               Plan - 12/15/15 1254    Clinical Impression Statement The patient is able to participate in a slight progression of repetitions and resistance with strengthening exercises.  She reports muscular fatigue and mild soreness and although she reports good pain relief from vasocompression and electrical stimulation, she declines today secondary to the need to get her son to an appointment.  Good AROM and progressing with strength  goals.  Therapist closely monitoring response with all.     PT Next Visit Plan continue progressive strengthening;  modalities as needed for pain control      Patient will benefit from skilled therapeutic intervention in order to improve the following deficits and impairments:     Visit Diagnosis: Shoulder stiffness, left  Muscle weakness (generalized)  Pain in left shoulder     Problem List Patient Active Problem List   Diagnosis Date Noted  . Insomnia 10/29/2014  . Plantar fasciitis 12/02/2013  . Benign essential HTN 05/28/2013  . Hepatic steatosis 04/12/2011  . Back pain 06/16/2010  . HEMORRHOIDS, EXTERNAL 12/21/2009  . DEPRESSION, MAJOR, MODERATE 04/04/2007  . HAIR LOSS 08/23/2006  . OBESITY NOS 05/28/2006  . FATIGUE 05/28/2006  . GLUCOSE INTOLERANCE 05/23/2006  . Migraine 05/23/2006   Lavinia Sharps, PT 12/15/15 1:30 PM Phone: [phone] Fax: [phone]  Vivien Presto 12/15/2015, 1:29 PM  [facility] [address] [place], [place], [address] Phone: [phone]   Fax:  [phone]  Name: Hailey Hopkins MRN: [id number] Date of Birth: [birth year]

## 2015-12-19 ENCOUNTER — Ambulatory Visit: Payer: PRIVATE HEALTH INSURANCE | Admitting: Physical Therapy

## 2015-12-19 ENCOUNTER — Encounter: Payer: Self-pay | Admitting: Physical Therapy

## 2015-12-19 DIAGNOSIS — M25512 Pain in left shoulder: Secondary | ICD-10-CM

## 2015-12-19 DIAGNOSIS — M25612 Stiffness of left shoulder, not elsewhere classified: Secondary | ICD-10-CM

## 2015-12-19 DIAGNOSIS — M6281 Muscle weakness (generalized): Secondary | ICD-10-CM

## 2015-12-19 NOTE — Therapy (Signed)
[facility] [address] [place], [place], [address] Phone: [phone]   Fax:  [phone]  Physical Therapy Treatment  Patient Details  Name: Hailey ButteryFelicia G Hopkins MRN: [id number] Date of Birth: 11/04/1975 Referring Provider: Valma Cavaollins, Andrew, MD  Encounter Date: 12/19/2015      PT End of Session - 12/19/15 1149    Visit Number 49   Number of Visits 57   Date for PT Re-Evaluation 01/06/16   Authorization Type Workers Comp   Authorization - Visit Number 49   Authorization - Number of Visits 57   PT Start Time 1147   PT Stop Time 1235   PT Time Calculation (min) 48 min   Activity Tolerance Patient tolerated treatment well   Behavior During Therapy [facility] for tasks assessed/performed      Past Medical History:  Diagnosis Date  . Depression   . Diverticular disease     Past Surgical History:  Procedure Laterality Date  . ABDOMINAL HYSTERECTOMY    . ROTATOR CUFF REPAIR      There were no vitals filed for this visit.      Subjective Assessment - 12/19/15 1150    Subjective Continues with soreness. Feels like it is her muscles.    Currently in Pain? Yes   Pain Score 2    Pain Location Shoulder   Pain Orientation Left   Pain Descriptors / Indicators Dull;Sore   Aggravating Factors  Overdoing it   Pain Relieving Factors ice/heat   Multiple Pain Sites No                         [facility] Adult PT Treatment/Exercise - 12/19/15 0001      Shoulder Exercises: Seated   Other Seated Exercises 3 way rasie 1# 15x   D2 flexion 10x     Shoulder Exercises: Standing   Extension Strengthening;Left;20 reps;Theraband   Theraband Level (Shoulder Extension) Level 3 (Green)   Row MotorolaLeft;20 reps;Theraband   Theraband Level (Shoulder Row) Level 3 (Green)     Shoulder Exercises: Pulleys   Flexion 3 minutes   ABduction 3 minutes     Shoulder Exercises: ROM/Strengthening   UBE (Upper Arm Bike) L1 4x4    Concurrent review of status.   Other ROM/Strengthening Exercises Body blade 3 positions 30 sec each     Moist Heat Therapy   Number Minutes Moist Heat 10 Minutes   Moist Heat Location Shoulder     Electrical Stimulation   Electrical Stimulation Location Declined     Vasopneumatic   Number Minutes Vasopneumatic  --  declined                  PT Short Term Goals - 12/15/15 1326      PT SHORT TERM GOAL #1   Title She will be independent with inital HEP    Status Achieved     PT SHORT TERM GOAL #2   Title report a 25% reduction in Lt shoulder pain with use at home and work   Status Achieved     PT SHORT TERM GOAL #3   Title demonstrate Lt shoulder AROM flexion to 145 to improve use with self-care and overhead reaching   Status Achieved           PT Long Term Goals - 12/15/15 1326      PT LONG TERM GOAL #1   Title be independent in advanced HEP   Time 8  Period Weeks   Status On-going     PT LONG TERM GOAL #2   Title improve FOTO to < or = to 46% limitation   Time 8   Period Weeks   Status On-going     PT LONG TERM GOAL #3   Title report a 60% reduction in Lt shoulder pain with home tasks   Status Achieved     PT LONG TERM GOAL #4   Title demonstrate 4+/5 Lt shoulder strength throughout to improve endurance for Lt UE use   Time 8   Period Weeks   Status On-going     PT LONG TERM GOAL #5   Title able to lift to waist and carry 10 pounds or more to increase home and work participation as MD allows   Time 8   Period Weeks   Status On-going     PT LONG TERM GOAL #6   Title demonstrate Lt shoulder IR to lacking < or = to 2 inches vs the Rt to improve self-care/dressing   Time 8   Period Weeks   Status On-going               Plan - 12/19/15 1222    Clinical Impression Statement Continues with working towards increasing progressive resistive exercises for her shoulder. Current level is still challenging as pt is now getting much better  at scapular stabilization when moving her arm.  Pt monitored througout session.     PT Next Visit Plan continue progressive strengthening;  modalities as needed for pain control   Consulted and Agree with Plan of Care Patient      Patient will benefit from skilled therapeutic intervention in order to improve the following deficits and impairments:  Postural dysfunction, Decreased strength, Impaired flexibility, Pain, Impaired UE functional use, Decreased activity tolerance, Decreased range of motion  Visit Diagnosis: Shoulder stiffness, left  Muscle weakness (generalized)  Pain in left shoulder     Problem List Patient Active Problem List   Diagnosis Date Noted  . Insomnia 10/29/2014  . Plantar fasciitis 12/02/2013  . Benign essential HTN 05/28/2013  . Hepatic steatosis 04/12/2011  . Back pain 06/16/2010  . HEMORRHOIDS, EXTERNAL 12/21/2009  . DEPRESSION, MAJOR, MODERATE 04/04/2007  . HAIR LOSS 08/23/2006  . OBESITY NOS 05/28/2006  . FATIGUE 05/28/2006  . GLUCOSE INTOLERANCE 05/23/2006  . Migraine 05/23/2006    Lacresha Fusilier 12/19/2015, 12:25 PM  [facility] [address], [address] [place], [place], [address] Phone: [phone]   Fax:  [phone]  Name: Hailey Hopkins MRN: [id number] Date of Birth: 09-20-1975

## 2015-12-21 ENCOUNTER — Encounter: Payer: Self-pay | Admitting: Physical Therapy

## 2015-12-21 ENCOUNTER — Ambulatory Visit: Payer: PRIVATE HEALTH INSURANCE | Admitting: Physical Therapy

## 2015-12-21 DIAGNOSIS — M25612 Stiffness of left shoulder, not elsewhere classified: Secondary | ICD-10-CM | POA: Diagnosis not present

## 2015-12-21 DIAGNOSIS — G8929 Other chronic pain: Secondary | ICD-10-CM

## 2015-12-21 DIAGNOSIS — M25512 Pain in left shoulder: Secondary | ICD-10-CM

## 2015-12-21 DIAGNOSIS — R293 Abnormal posture: Secondary | ICD-10-CM

## 2015-12-21 DIAGNOSIS — Z9889 Other specified postprocedural states: Secondary | ICD-10-CM

## 2015-12-21 DIAGNOSIS — M6281 Muscle weakness (generalized): Secondary | ICD-10-CM

## 2015-12-21 NOTE — Therapy (Signed)
[facility] [address] [place], [place], [address] Phone: [phone]   Fax:  [phone]  Physical Therapy Treatment  Patient Details  Name: Hailey Hopkins MRN: [id number] Date of Birth: Sep 16, 1975 Referring Provider: Valma Cava, MD  Encounter Date: 12/21/2015      PT End of Session - 12/21/15 1025    Visit Number 50   Number of Visits 57   Date for PT Re-Evaluation 01/06/16   Authorization Type Workers Comp   Authorization - Visit Number 50   Authorization - Number of Visits 57   PT Start Time 1022   PT Stop Time 1110   PT Time Calculation (min) 48 min   Activity Tolerance Patient tolerated treatment well   Behavior During Therapy [facility] for tasks assessed/performed      Past Medical History:  Diagnosis Date  . Depression   . Diverticular disease     Past Surgical History:  Procedure Laterality Date  . ABDOMINAL HYSTERECTOMY    . ROTATOR CUFF REPAIR      There were no vitals filed for this visit.      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  Lt shoulder surgery 12/14/2014 for partial rotator cuff repair and distal clavicle excision. March 15th injection into left shoulder under Ultrasound to break up scar tissue   Currently in Pain? Yes   Pain Score 2    Pain Location Shoulder   Pain Orientation Left                         [facility] Adult PT Treatment/Exercise - 12/21/15 0001      Shoulder Exercises: Seated   Other Seated Exercises 3 way rasie 1# 15x   D2 flexion 10x     Shoulder Exercises: Standing   Extension Strengthening;Left;20 reps;Theraband   Theraband Level (Shoulder Extension) Level 3 (Green)   Row [employer];Theraband   Theraband Level (Shoulder Row) Level 3 (Green)     Shoulder Exercises: Pulleys   Flexion 3 minutes   ABduction 3 minutes     Shoulder Exercises:  ROM/Strengthening   UBE (Upper Arm Bike) L1 4x4   Concurrent review of status.   Other ROM/Strengthening Exercises Body blade 3 positions 30 sec each     Moist Heat Therapy   Number Minutes Moist Heat 10 Minutes   Moist Heat Location Shoulder                  PT Short Term Goals - 12/21/15 1036      PT SHORT TERM GOAL #1   Title She will be independent with inital HEP    Baseline can reproduce correctly without cues.   Time 4   Period Weeks   Status Achieved           PT Long Term Goals - 12/21/15 1036      PT LONG TERM GOAL #1   Title be independent in advanced HEP   Time 8   Period Weeks   Status On-going     PT LONG TERM GOAL #2   Title improve FOTO to < or = to 46% limitation   Baseline 110 today   Time 8   Period Weeks   Status On-going     PT LONG TERM GOAL #3   Title report a 60% reduction in Lt shoulder pain with home tasks  Baseline more painful in the last week   Time 8   Period Weeks   Status Achieved     PT LONG TERM GOAL #4   Title demonstrate 4+/5 Lt shoulder strength throughout to improve endurance for Lt UE use   Baseline Pain with endrange left shoulder flexion   Time 8   Period Weeks   Status On-going     PT LONG TERM GOAL #5   Title able to lift to waist and carry 10 pounds or more to increase home and work participation as MD allows   Baseline able to do in clinic    Time 8   Period Weeks   Status On-going     PT LONG TERM GOAL #6   Title demonstrate Lt shoulder IR to lacking < or = to 2 inches vs the Rt to improve self-care/dressing   Time 8   Period Weeks   Status On-going               Plan - 12/21/15 1103    Clinical Impression Statement Pt continues to progress with shoulder strength and scapular stability. Therapist monitoring progress and increasing resistance and reps as needed. Will continue to increase shoulder stability as tolerated.    Rehab Potential Good   Clinical Impairments Affecting Rehab  Potential 11/10/15 left arthroscopic SAD/DCR   PT Frequency 5x / week   PT Duration 8 weeks   PT Treatment/Interventions ADLs/Self Care Home Management;Cryotherapy;Electrical Stimulation;Iontophoresis 4mg /ml Dexamethasone;Moist Heat;Therapeutic exercise;Therapeutic activities;Functional mobility training;Ultrasound;Neuromuscular re-education;Patient/family education;Manual techniques;Taping;Dry needling;Passive range of motion;Vasopneumatic Device   PT Next Visit Plan continue progressive strengthening;  modalities as needed for pain control      Patient will benefit from skilled therapeutic intervention in order to improve the following deficits and impairments:  Postural dysfunction, Decreased strength, Impaired flexibility, Pain, Impaired UE functional use, Decreased activity tolerance, Decreased range of motion  Visit Diagnosis: Shoulder stiffness, left  Muscle weakness (generalized)  Pain in left shoulder  Muscle weakness of left arm  Chronic pain in left shoulder  Abnormal posture  Stiffness of left shoulder joint  Status post arthroscopy of shoulder     Problem List Patient Active Problem List   Diagnosis Date Noted  . Insomnia 10/29/2014  . Plantar fasciitis 12/02/2013  . Benign essential HTN 05/28/2013  . Hepatic steatosis 04/12/2011  . Back pain 06/16/2010  . HEMORRHOIDS, EXTERNAL 12/21/2009  . DEPRESSION, MAJOR, MODERATE 04/04/2007  . HAIR LOSS 08/23/2006  . OBESITY NOS 05/28/2006  . FATIGUE 05/28/2006  . GLUCOSE INTOLERANCE 05/23/2006  . Migraine 05/23/2006    Dessa PhiKatherine Derrico Zhong PTA 12/21/2015, 11:22 AM  [facility] [address] [place], [place], [address] Phone: [phone]   Fax:  [phone]  Name: Hailey Hopkins MRN: [id number] Date of Birth: 12-01-1975

## 2015-12-23 ENCOUNTER — Ambulatory Visit: Payer: PRIVATE HEALTH INSURANCE | Admitting: Physical Therapy

## 2015-12-23 ENCOUNTER — Encounter: Payer: Self-pay | Admitting: Physical Therapy

## 2015-12-23 DIAGNOSIS — M6281 Muscle weakness (generalized): Secondary | ICD-10-CM

## 2015-12-23 DIAGNOSIS — M25612 Stiffness of left shoulder, not elsewhere classified: Secondary | ICD-10-CM | POA: Diagnosis not present

## 2015-12-23 DIAGNOSIS — M25512 Pain in left shoulder: Secondary | ICD-10-CM

## 2015-12-23 NOTE — Therapy (Signed)
[facility] [address], [address] [place], [place], [address] Phone: [phone]   Fax:  [phone]  Physical Therapy Treatment  Patient Details  Name: Hailey Hopkins MRN: [id number] Date of Birth: 1976-01-09 Referring Provider: Valma Cavaollins, Andrew, MD  Encounter Date: 12/23/2015      PT End of Session - 12/23/15 1029    Visit Number 51   Number of Visits 57   Date for PT Re-Evaluation 01/06/16   Authorization Type Workers Comp   Authorization - Visit Number 51   Authorization - Number of Visits 57   PT Start Time 1021  Pt late   PT Stop Time 1055   PT Time Calculation (min) 34 min   Activity Tolerance Patient tolerated treatment well   Behavior During Therapy WFL for tasks assessed/performed      Past Medical History:  Diagnosis Date  . Depression   . Diverticular disease     Past Surgical History:  Procedure Laterality Date  . ABDOMINAL HYSTERECTOMY    . ROTATOR CUFF REPAIR      There were no vitals filed for this visit.      Subjective Assessment - 12/23/15 1028    Subjective Pt reports soreness.   Currently in Pain? Yes   Pain Score 2    Pain Location Shoulder   Pain Orientation Left   Multiple Pain Sites No                         [facility] Adult PT Treatment/Exercise - 12/23/15 0001      Shoulder Exercises: Seated   Other Seated Exercises 3 way rasie 2# 15x      Shoulder Exercises: Standing   Extension Strengthening;Left;Theraband  3 x10   Theraband Level (Shoulder Extension) Level 3 (Green)   Row --  3x10   Theraband Level (Shoulder Row) Level 3 (Green)   Other Standing Exercises countertop pushups 2 x10   Other Standing Exercises green band diagonal extensions D1/D2 15x     Shoulder Exercises: Pulleys   Flexion 3 minutes  Review of status concurrent   ABduction 3 minutes     Shoulder Exercises: ROM/Strengthening   UBE (Upper Arm Bike) L1 4x4    Other  ROM/Strengthening Exercises Body blade 3 positions 30 sec each                  PT Short Term Goals - 12/21/15 1036      PT SHORT TERM GOAL #1   Title She will be independent with inital HEP    Baseline can reproduce correctly without cues.   Time 4   Period Weeks   Status Achieved           PT Long Term Goals - 12/21/15 1036      PT LONG TERM GOAL #1   Title be independent in advanced HEP   Time 8   Period Weeks   Status On-going     PT LONG TERM GOAL #2   Title improve FOTO to < or = to 46% limitation   Baseline 110 today   Time 8   Period Weeks   Status On-going     PT LONG TERM GOAL #3   Title report a 60% reduction in Lt shoulder pain with home tasks   Baseline more painful in the last week   Time 8   Period Weeks   Status Achieved     PT LONG TERM  GOAL #4   Title demonstrate 4+/5 Lt shoulder strength throughout to improve endurance for Lt UE use   Baseline Pain with endrange left shoulder flexion   Time 8   Period Weeks   Status On-going     PT LONG TERM GOAL #5   Title able to lift to waist and carry 10 pounds or more to increase home and work participation as MD allows   Baseline able to do in clinic    Time 8   Period Weeks   Status On-going     PT LONG TERM GOAL #6   Title demonstrate Lt shoulder IR to lacking < or = to 2 inches vs the Rt to improve self-care/dressing   Time 8   Period Weeks   Status On-going               Plan - 12/23/15 1029    Clinical Impression Statement Monitored pain throughout session. Increased weight on 3 way raise today without issues. Also added countertop pushups which was challenging but done well.    Rehab Potential Good   Clinical Impairments Affecting Rehab Potential 11/10/15 left arthroscopic SAD/DCR   PT Frequency 5x / week   PT Duration 8 weeks   PT Treatment/Interventions ADLs/Self Care Home Management;Cryotherapy;Electrical Stimulation;Iontophoresis 4mg /ml Dexamethasone;Moist  Heat;Therapeutic exercise;Therapeutic activities;Functional mobility training;Ultrasound;Neuromuscular re-education;Patient/family education;Manual techniques;Taping;Dry needling;Passive range of motion;Vasopneumatic Device   PT Next Visit Plan continue progressive strengthening;  modalities as needed for pain control   Consulted and Agree with Plan of Care Patient      Patient will benefit from skilled therapeutic intervention in order to improve the following deficits and impairments:  Postural dysfunction, Decreased strength, Impaired flexibility, Pain, Impaired UE functional use, Decreased activity tolerance, Decreased range of motion  Visit Diagnosis: Shoulder stiffness, left  Muscle weakness (generalized)  Pain in left shoulder     Problem List Patient Active Problem List   Diagnosis Date Noted  . Insomnia 10/29/2014  . Plantar fasciitis 12/02/2013  . Benign essential HTN 05/28/2013  . Hepatic steatosis 04/12/2011  . Back pain 06/16/2010  . HEMORRHOIDS, EXTERNAL 12/21/2009  . DEPRESSION, MAJOR, MODERATE 04/04/2007  . HAIR LOSS 08/23/2006  . OBESITY NOS 05/28/2006  . FATIGUE 05/28/2006  . GLUCOSE INTOLERANCE 05/23/2006  . Migraine 05/23/2006    Pat Hailey Hopkins, PTA 12/23/2015, 10:54 AM  [facility] [address] [place], [place], [address] Phone: [phone]   Fax:  [phone]  Name: Hailey Hopkins MRN: [id number] Date of Birth: 1975/09/10

## 2015-12-26 ENCOUNTER — Encounter: Payer: Self-pay | Admitting: Physical Therapy

## 2015-12-28 ENCOUNTER — Encounter: Payer: Self-pay | Admitting: Physical Therapy

## 2015-12-28 ENCOUNTER — Ambulatory Visit: Payer: PRIVATE HEALTH INSURANCE | Attending: Surgical | Admitting: Physical Therapy

## 2015-12-28 DIAGNOSIS — G8929 Other chronic pain: Secondary | ICD-10-CM | POA: Diagnosis present

## 2015-12-28 DIAGNOSIS — M25612 Stiffness of left shoulder, not elsewhere classified: Secondary | ICD-10-CM | POA: Insufficient documentation

## 2015-12-28 DIAGNOSIS — M25512 Pain in left shoulder: Secondary | ICD-10-CM | POA: Insufficient documentation

## 2015-12-28 DIAGNOSIS — M6281 Muscle weakness (generalized): Secondary | ICD-10-CM | POA: Insufficient documentation

## 2015-12-28 NOTE — Therapy (Signed)
[facility] [address], [address] [place], [place], [address] Phone: [phone]   Fax:  [phone]  Physical Therapy Treatment  Patient Details  Name: Hailey Hopkins MRN: [id number] Date of Birth: 14-Dec-1975 Referring Provider: Valma Cava, MD  Encounter Date: 12/28/2015      PT End of Session - 12/28/15 1021    Visit Number 52   Number of Visits 57   Date for PT Re-Evaluation 01/06/16   Authorization Type Workers Comp   Authorization - Visit Number 52   Authorization - Number of Visits 57   PT Start Time 1017   PT Stop Time 1056   PT Time Calculation (min) 39 min   Activity Tolerance Patient tolerated treatment well   Behavior During Therapy WFL for tasks assessed/performed      Past Medical History:  Diagnosis Date  . Depression   . Diverticular disease     Past Surgical History:  Procedure Laterality Date  . ABDOMINAL HYSTERECTOMY    . ROTATOR CUFF REPAIR      There were no vitals filed for this visit.      Subjective Assessment - 12/28/15 1019    Subjective Pt reports soreness in anteriror Lt shoulder today. Feeling like an annoying ache, not like how it has been hurting.   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  Lt shoulder surgery 12/14/2014 for partial rotator cuff repair and distal clavicle excision. March 15th injection into left shoulder under Ultrasound to break up scar tissue   Diagnostic tests injection: Cortizone 04/2015   Patient Stated Goals reduce pain, return to normal use of Lt UE   Currently in Pain? Yes   Pain Score 4    Pain Location Shoulder   Pain Orientation Left   Pain Descriptors / Indicators Dull;Sore   Pain Type Surgical pain   Pain Onset More than a month ago                         [facility] Adult PT Treatment/Exercise - 12/28/15 0001      Shoulder Exercises: Seated   Other Seated Exercises 3 way rasie 2# 15x      Shoulder  Exercises: Standing   Extension Strengthening;Left;Theraband  3 x10   Theraband Level (Shoulder Extension) Level 3 (Green)   Row --  3x10   Theraband Level (Shoulder Row) Level 3 (Green)   Other Standing Exercises countertop pushups 2 x10   Other Standing Exercises green band diagonal extensions D1/D2 15x     Shoulder Exercises: Pulleys   Flexion 3 minutes  Review of status concurrent   ABduction 3 minutes     Shoulder Exercises: ROM/Strengthening   UBE (Upper Arm Bike) L1 4x4    Other ROM/Strengthening Exercises Body blade 3 positions 30 sec each                  PT Short Term Goals - 12/28/15 1024      PT SHORT TERM GOAL #1   Title She will be independent with inital HEP    Baseline can reproduce correctly without cues.   Time 4   Period Weeks   Status Achieved     PT SHORT TERM GOAL #2   Title report a 25% reduction in Lt shoulder pain with use at home and work   Baseline AA arm to fix hair   Time 4   Period Weeks   Status Achieved  PT SHORT TERM GOAL #3   Title demonstrate Lt shoulder AROM flexion to 145 to improve use with self-care and overhead reaching   Baseline understands and uses RICE at home   Time 4   Period Weeks   Status Achieved           PT Long Term Goals - 12/28/15 1026      PT LONG TERM GOAL #1   Title be independent in advanced HEP   Time 8   Period Weeks   Status On-going     PT LONG TERM GOAL #2   Title improve FOTO to < or = to 46% limitation   Baseline 110 today   Time 8   Period Weeks   Status On-going     PT LONG TERM GOAL #3   Title report a 60% reduction in Lt shoulder pain with home tasks   Baseline more painful in the last week   Time 8   Period Weeks   Status Achieved     PT LONG TERM GOAL #4   Title demonstrate 4+/5 Lt shoulder strength throughout to improve endurance for Lt UE use   Baseline Pain with endrange left shoulder flexion   Time 8   Period Weeks   Status On-going     PT LONG TERM  GOAL #5   Title able to lift to waist and carry 10 pounds or more to increase home and work participation as MD allows   Baseline able to do in clinic    Time 8   Period Weeks   Status On-going     PT LONG TERM GOAL #6   Title demonstrate Lt shoulder IR to lacking < or = to 2 inches vs the Rt to improve self-care/dressing   Time 8   Period Weeks   Status On-going               Plan - 12/28/15 1117    Clinical Impression Statement Pt continues to improve with shoulder endurance and strength. Pt able tolerate all exercises well with good form. Pt would continue to benefit from skilled therapy for  progression of strength in diagonals and overhead.  Therapist monitored pain throughout treatment.    Rehab Potential Good   Clinical Impairments Affecting Rehab Potential 11/10/15 left arthroscopic SAD/DCR   PT Frequency 5x / week   PT Duration 8 weeks   PT Treatment/Interventions ADLs/Self Care Home Management;Cryotherapy;Electrical Stimulation;Iontophoresis 4mg /ml Dexamethasone;Moist Heat;Therapeutic exercise;Therapeutic activities;Functional mobility training;Ultrasound;Neuromuscular re-education;Patient/family education;Manual techniques;Taping;Dry needling;Passive range of motion;Vasopneumatic Device   PT Next Visit Plan continue progressive strengthening;  modalities as needed for pain control   Consulted and Agree with Plan of Care Patient      Patient will benefit from skilled therapeutic intervention in order to improve the following deficits and impairments:  Postural dysfunction, Decreased strength, Impaired flexibility, Pain, Impaired UE functional use, Decreased activity tolerance, Decreased range of motion  Visit Diagnosis: Shoulder stiffness, left  Muscle weakness (generalized)  Acute pain of left shoulder     Problem List Patient Active Problem List   Diagnosis Date Noted  . Insomnia 10/29/2014  . Plantar fasciitis 12/02/2013  . Benign essential HTN  05/28/2013  . Hepatic steatosis 04/12/2011  . Back pain 06/16/2010  . HEMORRHOIDS, EXTERNAL 12/21/2009  . DEPRESSION, MAJOR, MODERATE 04/04/2007  . HAIR LOSS 08/23/2006  . OBESITY NOS 05/28/2006  . FATIGUE 05/28/2006  . GLUCOSE INTOLERANCE 05/23/2006  . Migraine 05/23/2006    Dessa PhiKatherine Larcenia Holaday PTA 12/28/2015, 12:55 PM  [facility] [address], [address] [place], [place], [address] Phone: [phone]   Fax:  [phone]  Name: Hailey Hopkins MRN: [id number] Date of Birth: 1976/01/21

## 2015-12-30 ENCOUNTER — Ambulatory Visit: Payer: PRIVATE HEALTH INSURANCE | Admitting: Physical Therapy

## 2015-12-30 DIAGNOSIS — M25612 Stiffness of left shoulder, not elsewhere classified: Secondary | ICD-10-CM

## 2015-12-30 DIAGNOSIS — M6281 Muscle weakness (generalized): Secondary | ICD-10-CM

## 2015-12-30 DIAGNOSIS — M25512 Pain in left shoulder: Secondary | ICD-10-CM

## 2015-12-30 NOTE — Therapy (Signed)
[facility] [address], [address] [place]iley [place], [place], [address] Phone: [phone]   Fax:  [phone]  Physical Therapy Treatment  Patient Details  Name: Hailey Hopkins MRN: [id number] Date of Birth: [birth year] Referring Provider: Valma Cavaollins, Andrew, MD  Encounter Date: 12/30/2015      Hailey Hopkins End of Session - 12/30/15 1046    Visit Number 53   Number of Visits 57   Date for Hailey Hopkins Re-Evaluation 01/06/16   Authorization Type Workers Comp   Authorization - Visit Number 53   Authorization - Number of Visits 57   Hailey Hopkins Start Time 1015   Hailey Hopkins Stop Time 1100   Hailey Hopkins Time Calculation (min) 45 min   Activity Tolerance Patient tolerated treatment well      Past Medical History:  Diagnosis Date  . Depression   . Diverticular disease     Past Surgical History:  Procedure Laterality Date  . ABDOMINAL HYSTERECTOMY    . ROTATOR CUFF REPAIR      There were no vitals filed for this visit.      Subjective Assessment - 12/30/15 1017    Subjective Went to the gym this morning and did cardio,legs and abdominals.  I can now do this without my arm hurting.  Some anterior shoulder pain particularly at night.     Currently in Pain? Yes   Pain Score 2    Pain Location Shoulder   Pain Orientation Left   Pain Type Surgical pain                         [facility] Adult Hailey Hopkins Treatment/Exercise - 12/30/15 0001      Shoulder Exercises: Standing   Other Standing Exercises 2# 3 way raises 15x each   Other Standing Exercises green band diagonal extensions D1/D2 15x     Shoulder Exercises: Pulleys   Flexion 3 minutes  Review of status concurrent   ABduction 3 minutes     Shoulder Exercises: ROM/Strengthening   UBE (Upper Arm Bike) L1 4x4      Shoulder Exercises: Power [employer]ower   Row 20 reps   Row Limitations 15#   Other Power Academic librarianTower Exercises Lat Bar 15# 2x10     Manual Therapy   Kinesiotex Facilitate Muscle     Kinesiotix   Facilitate Muscle  biceps to decrease pain                  Hailey Hopkins Short Term Goals - 12/30/15 1052      Hailey Hopkins SHORT TERM GOAL #1   Title She will be independent with inital HEP    Status Achieved     Hailey Hopkins SHORT TERM GOAL #2   Title report a 25% reduction in Lt shoulder pain with use at home and work   Status Achieved     Hailey Hopkins SHORT TERM GOAL #3   Title demonstrate Lt shoulder AROM flexion to 145 to improve use with self-care and overhead reaching   Status Achieved           Hailey Hopkins Long Term Goals - 12/30/15 1052      Hailey Hopkins LONG TERM GOAL #1   Title be independent in advanced HEP   Time 8   Period Weeks   Status On-going     Hailey Hopkins LONG TERM GOAL #2   Title improve FOTO to < or = to 46% limitation   Time 8   Period Weeks   Status On-going  Hailey Hopkins LONG TERM GOAL #3   Title report a 60% reduction in Lt shoulder pain with home tasks   Status Achieved     Hailey Hopkins LONG TERM GOAL #4   Title demonstrate 4+/5 Lt shoulder strength throughout to improve endurance for Lt UE use   Time 8   Period Weeks   Status On-going     Hailey Hopkins LONG TERM GOAL #5   Title able to lift to waist and carry 10 pounds or more to increase home and work participation as MD allows   Time 8   Period Weeks   Status On-going     Hailey Hopkins LONG TERM GOAL #6   Title demonstrate Lt shoulder IR to lacking < or = to 2 inches vs the Rt to improve self-care/dressing   Period Weeks   Status On-going               Plan - 12/30/15 1047    Clinical Impression Statement The patient is progressing well with glenohumeral and scapular muscle strengthening.  She reports anterior shoulder pain in proximal biceps region pain.  Kinesiotape applied to decrease pain.  She is able to do lat bar and seated plate machine vertical rows which would be good equipment to start on post Hailey Hopkins.  Discussed holding off on military presses and pectoral press at the gym for at least 6 months to a 1 year.  Therapist monitoring pain and verbally cueing to  decrease compensatory shoulder hike.     Hailey Hopkins Next Visit Plan continue progressive strengthening;  modalities as needed for pain control;  assess response to biceps kinesiotape      Patient will benefit from skilled therapeutic intervention in order to improve the following deficits and impairments:     Visit Diagnosis: Shoulder stiffness, left  Muscle weakness (generalized)  Acute pain of left shoulder     Problem List Patient Active Problem List   Diagnosis Date Noted  . Insomnia 10/29/2014  . Plantar fasciitis 12/02/2013  . Benign essential HTN 05/28/2013  . Hepatic steatosis 04/12/2011  . Back pain 06/16/2010  . HEMORRHOIDS, EXTERNAL 12/21/2009  . DEPRESSION, MAJOR, MODERATE 04/04/2007  . HAIR LOSS 08/23/2006  . OBESITY NOS 05/28/2006  . FATIGUE 05/28/2006  . GLUCOSE INTOLERANCE 05/23/2006  . Migraine 05/23/2006  Hailey Hopkins, Hailey Hopkins 12/30/15 11:05 AM Phone: [phone] Fax: [phone]   Hailey Hopkins 12/30/2015, 11:05 AM  [facility] [address] [place], [place], [address] Phone: [phone]   Fax:  [phone]  Name: Hailey Hopkins MRN: [id number] Date of Birth: 1975/11/22

## 2016-01-02 ENCOUNTER — Ambulatory Visit: Payer: PRIVATE HEALTH INSURANCE | Admitting: Physical Therapy

## 2016-01-02 ENCOUNTER — Encounter: Payer: Self-pay | Admitting: Physical Therapy

## 2016-01-02 DIAGNOSIS — M25612 Stiffness of left shoulder, not elsewhere classified: Secondary | ICD-10-CM

## 2016-01-02 DIAGNOSIS — M25512 Pain in left shoulder: Secondary | ICD-10-CM

## 2016-01-02 DIAGNOSIS — M6281 Muscle weakness (generalized): Secondary | ICD-10-CM

## 2016-01-02 NOTE — Therapy (Signed)
[facility] [address], [address] [place], [place], [address] Phone: [phone]   Fax:  [phone]  Physical Therapy Treatment  Patient Details  Name: Hailey ButteryFelicia G Darnell MRN: [id number] Date of Birth: 06/21/1975 Referring Provider: Valma Cavaollins, Andrew, MD  Encounter Date: 01/02/2016      PT End of Session - 01/02/16 1021    Visit Number 54   Number of Visits 57   Date for PT Re-Evaluation 01/06/16   Authorization Type Workers Comp   Authorization - Visit Number 54   Authorization - Number of Visits 57   PT Start Time 1018   PT Stop Time 1105   PT Time Calculation (min) 47 min   Activity Tolerance Patient tolerated treatment well   Behavior During Therapy WFL for tasks assessed/performed      Past Medical History:  Diagnosis Date  . Depression   . Diverticular disease     Past Surgical History:  Procedure Laterality Date  . ABDOMINAL HYSTERECTOMY    . ROTATOR CUFF REPAIR      There were no vitals filed for this visit.      Subjective Assessment - 01/02/16 1019    Subjective The weather is bothering me today. Not feeling well over all.    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  Lt shoulder surgery 12/14/2014 for partial rotator cuff repair and distal clavicle excision. March 15th injection into left shoulder under Ultrasound to break up scar tissue   Currently in Pain? Yes   Pain Score 2    Pain Location Shoulder   Pain Orientation Left   Pain Descriptors / Indicators Dull;Sore   Pain Type Surgical pain            [facility] PT Assessment - 01/02/16 0001      AROM   Left Shoulder Flexion 168 Degrees  Standing   Left Shoulder ABduction 155 Degrees  Standing   Left Shoulder Internal Rotation 90 Degrees   Left Shoulder External Rotation 90 Degrees     Strength   Left Shoulder Flexion 4/5   Left Shoulder ABduction 4/5   Left Shoulder Internal Rotation 5/5   Left Shoulder External  Rotation 5/5                     [facility] Adult PT Treatment/Exercise - 01/02/16 0001      Shoulder Exercises: Seated   Other Seated Exercises 3 way rasie 2# 15x      Shoulder Exercises: Standing   Other Standing Exercises green band diagonal extensions D1/D2 15x     Shoulder Exercises: Pulleys   Flexion 3 minutes  Review of status concurrent   ABduction 3 minutes   Other Pulley Exercises Internal Rotation     Shoulder Exercises: ROM/Strengthening   UBE (Upper Arm Bike) L1 4x4   while therapist assesing progress   Other ROM/Strengthening Exercises Body blade 3 positions 30 sec each     Shoulder Exercises: Power Hex[employer] Specialty [employer]ower   Row 20 reps   Row Limitations #15   Other Power Tower Exercises Lat Bar 15# 2x10                  PT Short Term Goals - 01/02/16 1021      PT SHORT TERM GOAL #1   Title She will be independent with inital HEP    Baseline can reproduce correctly without cues.   Time 4   Period Weeks   Status Achieved  PT SHORT TERM GOAL #2   Title report a 25% reduction in Lt shoulder pain with use at home and work   Baseline AA arm to fix hair   Time 4   Period Weeks   Status Achieved     PT SHORT TERM GOAL #3   Title demonstrate Lt shoulder AROM flexion to 145 to improve use with self-care and overhead reaching   Baseline understands and uses RICE at home   Time 4   Period Weeks   Status Achieved           PT Long Term Goals - 01/02/16 1022      PT LONG TERM GOAL #1   Title be independent in advanced HEP   Time 8   Period Weeks   Status On-going     PT LONG TERM GOAL #2   Title improve FOTO to < or = to 46% limitation   Time 8   Period Weeks   Status On-going     PT LONG TERM GOAL #3   Title report a 60% reduction in Lt shoulder pain with home tasks   Time 8   Period Weeks   Status Achieved     PT LONG TERM GOAL #4   Title demonstrate 4+/5 Lt shoulder strength throughout to improve endurance for Lt UE use   Baseline  Pain with endrange left shoulder flexion   Time 8   Period Weeks   Status On-going     PT LONG TERM GOAL #5   Title able to lift to waist and carry 10 pounds or more to increase home and work participation as MD allows   Baseline able to do in clinic    Time 8   Period Weeks   Status On-going     PT LONG TERM GOAL #6   Title demonstrate Lt shoulder IR to lacking < or = to 2 inches vs the Rt to improve self-care/dressing   Time 8   Period Weeks   Status On-going               Plan - 01/02/16 1058    Clinical Impression Statement Pt reports no noticable benefit from tape. Pt continues to progress with shoulder strengthening and endurance. Pt able to complete all exercises well with some fatigue but no increase in pain. Pt continues to have some pain in anterior shoulder. A/ROM measured in standing. Shoulder range is within functional limits. Pt will continue to benefit from skilled therapy for shoulder strengthening and stability to perform daily activities.    Rehab Potential Good   Clinical Impairments Affecting Rehab Potential 11/10/15 left arthroscopic SAD/DCR   PT Frequency 5x / week   PT Duration 8 weeks   PT Treatment/Interventions ADLs/Self Care Home Management;Cryotherapy;Electrical Stimulation;Iontophoresis 4mg /ml Dexamethasone;Moist Heat;Therapeutic exercise;Therapeutic activities;Functional mobility training;Ultrasound;Neuromuscular re-education;Patient/family education;Manual techniques;Taping;Dry needling;Passive range of motion;Vasopneumatic Device   PT Next Visit Plan continue progressive strengthening;  modalities as needed for pain control;  assess response to biceps kinesiotape   Consulted and Agree with Plan of Care Patient      Patient will benefit from skilled therapeutic intervention in order to improve the following deficits and impairments:  Postural dysfunction, Decreased strength, Impaired flexibility, Pain, Impaired UE functional use, Decreased activity  tolerance, Decreased range of motion  Visit Diagnosis: Shoulder stiffness, left  Muscle weakness (generalized)  Acute pain of left shoulder     Problem List Patient Active Problem List   Diagnosis Date Noted  . Insomnia 10/29/2014  .  Plantar fasciitis 12/02/2013  . Benign essential HTN 05/28/2013  . Hepatic steatosis 04/12/2011  . Back pain 06/16/2010  . HEMORRHOIDS, EXTERNAL 12/21/2009  . DEPRESSION, MAJOR, MODERATE 04/04/2007  . HAIR LOSS 08/23/2006  . OBESITY NOS 05/28/2006  . FATIGUE 05/28/2006  . GLUCOSE INTOLERANCE 05/23/2006  . Migraine 05/23/2006    Dessa Phi PTA 01/02/2016, 4:23 PM  [facility] [address] [place], [place], [address] Phone: [phone]   Fax:  [phone]  Name: BIVIANA SADDLER MRN: [id number] Date of Birth: 12-14-1975

## 2016-01-04 ENCOUNTER — Ambulatory Visit: Payer: Self-pay

## 2016-01-06 ENCOUNTER — Encounter: Payer: Self-pay | Admitting: Physical Therapy

## 2016-01-06 ENCOUNTER — Ambulatory Visit: Payer: PRIVATE HEALTH INSURANCE | Admitting: Physical Therapy

## 2016-01-06 DIAGNOSIS — M6281 Muscle weakness (generalized): Secondary | ICD-10-CM

## 2016-01-06 DIAGNOSIS — M25612 Stiffness of left shoulder, not elsewhere classified: Secondary | ICD-10-CM | POA: Diagnosis not present

## 2016-01-06 DIAGNOSIS — M25512 Pain in left shoulder: Secondary | ICD-10-CM

## 2016-01-06 NOTE — Therapy (Signed)
[facility] [address] [place], [place], [address] Phone: [phone]   Fax:  [phone]  Physical Therapy Treatment  Patient Details  Name: Hailey Hopkins MRN: [id number] Date of Birth: 1976/01/01 Referring Provider: Valma Cava, MD  Encounter Date: 01/06/2016      PT End of Session - 01/06/16 1042    Visit Number 55   Number of Visits 57   Date for PT Re-Evaluation 02/03/16   Authorization Type Workers Comp   Authorization - Visit Number 55   Authorization - Number of Visits 57   PT Start Time 1015   PT Stop Time 1055   PT Time Calculation (min) 40 min   Activity Tolerance Patient tolerated treatment well   Behavior During Therapy WFL for tasks assessed/performed      Past Medical History:  Diagnosis Date  . Depression   . Diverticular disease     Past Surgical History:  Procedure Laterality Date  . ABDOMINAL HYSTERECTOMY    . ROTATOR CUFF REPAIR      There were no vitals filed for this visit.      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  Lt shoulder surgery 12/14/2014 for partial rotator cuff repair and distal clavicle excision. March 15th injection into left shoulder under Ultrasound to break up scar tissue   Currently in Pain? Yes   Pain Score 2    Pain Location Shoulder   Pain Orientation Left   Pain Descriptors / Indicators Sore   Pain Type Surgical pain   Pain Onset More than a month ago            [facility] PT Assessment - 01/06/16 0001      AROM   Left Shoulder Flexion 145 Degrees   Left Shoulder ABduction 150 Degrees   Left Shoulder Internal Rotation 90 Degrees   Left Shoulder External Rotation 90 Degrees     Strength   Left Shoulder Flexion 4/5   Left Shoulder ABduction 4/5   Left Shoulder Internal Rotation 5/5   Left Shoulder  External Rotation 5/5                     [facility] Adult PT Treatment/Exercise - 01/06/16 0001      Shoulder Exercises: Seated   Other Seated Exercises 3 way raise 2# x 15     Shoulder Exercises: Standing   Other Standing Exercises green band diagonals D1/D2, 15x     Shoulder Exercises: Pulleys   Flexion 3 minutes   ABduction 3 minutes     Shoulder Exercises: ROM/Strengthening   UBE (Upper Arm Bike) L1 4/4   Other ROM/Strengthening Exercises body blade 3 positions x 30 seconds     Shoulder Exercises: Power [employer] 20 reps   Row Limitations 15#   Other Power Tower Exercises lat bar 15# 2 x 10                  PT Short Term Goals - 01/02/16 1021      PT SHORT TERM GOAL #1   Title She will be independent with inital HEP    Baseline can reproduce correctly without cues.   Time 4   Period Weeks   Status Achieved     PT SHORT TERM GOAL #2   Title report a 25% reduction  in Lt shoulder pain with use at home and work   Baseline AA arm to fix hair   Time 4   Period Weeks   Status Achieved     PT SHORT TERM GOAL #3   Title demonstrate Lt shoulder AROM flexion to 145 to improve use with self-care and overhead reaching   Baseline understands and uses RICE at home   Time 4   Period Weeks   Status Achieved           PT Long Term Goals - 01/06/16 1047      PT LONG TERM GOAL #2   Title improve FOTO to < or = to 46% limitation   Status Achieved  36% on 10/13     PT LONG TERM GOAL #3   Title report a 60% reduction in Lt shoulder pain with home tasks   Status Achieved     PT LONG TERM GOAL #4   Title demonstrate 4+/5 Lt shoulder strength throughout to improve endurance for Lt UE use   Status On-going               Plan - 01/06/16 1044    Clinical Impression Statement Pt has achieved pain and ROM goals, still limited in Lt shoulder strength and endurance. Pt will benefit from skilled PT to continue to progress strength to improve  function with work and recreational activities.   Rehab Potential Good   Clinical Impairments Affecting Rehab Potential 11/10/15 left arthroscopic SAD/DCR   PT Frequency 2x / week   PT Duration 4 weeks   PT Treatment/Interventions ADLs/Self Care Home Management;Cryotherapy;Electrical Stimulation;Iontophoresis 4mg /ml Dexamethasone;Moist Heat;Therapeutic exercise;Therapeutic activities;Functional mobility training;Ultrasound;Neuromuscular re-education;Patient/family education;Manual techniques;Taping;Dry needling;Passive range of motion;Vasopneumatic Device   PT Next Visit Plan continue strength, begin work simulation activities   Consulted and Agree with Plan of Care Patient      Patient will benefit from skilled therapeutic intervention in order to improve the following deficits and impairments:  Postural dysfunction, Decreased strength, Impaired flexibility, Pain, Impaired UE functional use, Decreased activity tolerance, Decreased range of motion, Decreased endurance  Visit Diagnosis: Shoulder stiffness, left - Plan: PT plan of care cert/re-cert  Muscle weakness (generalized) - Plan: PT plan of care cert/re-cert  Acute pain of left shoulder - Plan: PT plan of care cert/re-cert     Problem List Patient Active Problem List   Diagnosis Date Noted  . Insomnia 10/29/2014  . Plantar fasciitis 12/02/2013  . Benign essential HTN 05/28/2013  . Hepatic steatosis 04/12/2011  . Back pain 06/16/2010  . HEMORRHOIDS, EXTERNAL 12/21/2009  . DEPRESSION, MAJOR, MODERATE 04/04/2007  . HAIR LOSS 08/23/2006  . OBESITY NOS 05/28/2006  . FATIGUE 05/28/2006  . GLUCOSE INTOLERANCE 05/23/2006  . Migraine 05/23/2006    Reggy EyeKaren Elisha Cooksey, PT, DPT 01/06/2016, 10:50 AM  [facility] [address] [place], [place], [address] Phone: [phone]   Fax:  [phone]  Name: Larinda ButteryFelicia G Andreas MRN: [id number] Date of Birth: [birth year]

## 2016-01-09 ENCOUNTER — Ambulatory Visit: Payer: PRIVATE HEALTH INSURANCE | Admitting: Physical Therapy

## 2016-01-09 ENCOUNTER — Encounter: Payer: Self-pay | Admitting: Physical Therapy

## 2016-01-09 DIAGNOSIS — M25612 Stiffness of left shoulder, not elsewhere classified: Secondary | ICD-10-CM | POA: Diagnosis not present

## 2016-01-09 DIAGNOSIS — M25512 Pain in left shoulder: Secondary | ICD-10-CM

## 2016-01-09 DIAGNOSIS — M6281 Muscle weakness (generalized): Secondary | ICD-10-CM

## 2016-01-09 NOTE — Therapy (Signed)
[facility] [address], [address] [place], [place], [address] Phone: [phone]   Fax:  [phone]  Physical Therapy Treatment  Patient Details  Name: Hailey ButteryFelicia G Pendergrass MRN: [id number] Date of Birth: [birth year] Referring Provider: Valma Cavaollins, Andrew, MD  Encounter Date: 01/09/2016      PT End of Session - 01/09/16 0808    Visit Number 56   Number of Visits 57   Date for PT Re-Evaluation 02/03/16   Authorization - Visit Number 56   Authorization - Number of Visits 57   PT Start Time 0800   PT Stop Time 0845   PT Time Calculation (min) 45 min   Activity Tolerance Patient tolerated treatment well   Behavior During Therapy [facility] for tasks assessed/performed      Past Medical History:  Diagnosis Date  . Depression   . Diverticular disease     Past Surgical History:  Procedure Laterality Date  . ABDOMINAL HYSTERECTOMY    . ROTATOR CUFF REPAIR      There were no vitals filed for this visit.      Subjective Assessment - 01/09/16 0806    Subjective I went to the gym this AM. I slipped in the shower yesterday and was able to catch myself with my Lt arm. Only soreness after.    Currently in Pain? Yes   Pain Score 4    Pain Location Shoulder   Pain Orientation Left   Pain Descriptors / Indicators Sore   Aggravating Factors  Got sore from slipping   Pain Relieving Factors Ice/heat   Multiple Pain Sites No                         [facility] Adult PT Treatment/Exercise - 01/09/16 0001      Shoulder Exercises: ROM/Strengthening   UBE (Upper Arm Bike) L1  4x4   Concurrent review of status.   Wall Pushups --  3 x10   Sustained Retraction with Theraband Low cable 1 plate 6N622x15 D2 flexion   Other ROM/Strengthening Exercises body blade 3 positions x 45 seconds   Other ROM/Strengthening Exercises 3 way raise 3# 15x     Shoulder Exercises: Power [employer] Specialty [employer]sower   Row 20 reps   Row Limitations 20#   Other Power Engineer, siteTower  Exercises lat bar 15# 3 x 10   Other Power Tower Exercises Chest press 2 plates X52x10, then 1 plate 8U132x10                  PT Short Term Goals - 01/02/16 1021      PT SHORT TERM GOAL #1   Title She will be independent with inital HEP    Baseline can reproduce correctly without cues.   Time 4   Period Weeks   Status Achieved     PT SHORT TERM GOAL #2   Title report a 25% reduction in Lt shoulder pain with use at home and work   Baseline AA arm to fix hair   Time 4   Period Weeks   Status Achieved     PT SHORT TERM GOAL #3   Title demonstrate Lt shoulder AROM flexion to 145 to improve use with self-care and overhead reaching   Baseline understands and uses RICE at home   Time 4   Period Weeks   Status Achieved           PT Long Term Goals - 01/06/16 1047  PT LONG TERM GOAL #2   Title improve FOTO to < or = to 46% limitation   Status Achieved  36% on 10/13     PT LONG TERM GOAL #3   Title report a 60% reduction in Lt shoulder pain with home tasks   Status Achieved     PT LONG TERM GOAL #4   Title demonstrate 4+/5 Lt shoulder strength throughout to improve endurance for Lt UE use   Status On-going               Plan - 01/09/16 0808    Clinical Impression Statement Increased weight today with her free weights which was challenging. Also increased weights on her isokinetic machines, tolerating all , working towards strengthening her shoulder.    Rehab Potential Good   Clinical Impairments Affecting Rehab Potential 11/10/15 left arthroscopic SAD/DCR   PT Frequency 2x / week   PT Duration 4 weeks   PT Treatment/Interventions ADLs/Self Care Home Management;Cryotherapy;Electrical Stimulation;Iontophoresis 4mg /ml Dexamethasone;Moist Heat;Therapeutic exercise;Therapeutic activities;Functional mobility training;Ultrasound;Neuromuscular re-education;Patient/family education;Manual techniques;Taping;Dry needling;Passive range of motion;Vasopneumatic Device    PT Next Visit Plan continue strength, begin work simulation activities, pt has one more approved visit.    Consulted and Agree with Plan of Care Patient      Patient will benefit from skilled therapeutic intervention in order to improve the following deficits and impairments:  Postural dysfunction, Decreased strength, Impaired flexibility, Pain, Impaired UE functional use, Decreased activity tolerance, Decreased range of motion, Decreased endurance  Visit Diagnosis: Muscle weakness (generalized)  Shoulder stiffness, left  Acute pain of left shoulder     Problem List Patient Active Problem List   Diagnosis Date Noted  . Insomnia 10/29/2014  . Plantar fasciitis 12/02/2013  . Benign essential HTN 05/28/2013  . Hepatic steatosis 04/12/2011  . Back pain 06/16/2010  . HEMORRHOIDS, EXTERNAL 12/21/2009  . DEPRESSION, MAJOR, MODERATE 04/04/2007  . HAIR LOSS 08/23/2006  . OBESITY NOS 05/28/2006  . FATIGUE 05/28/2006  . GLUCOSE INTOLERANCE 05/23/2006  . Migraine 05/23/2006    Khali Perella , PTA 01/09/2016, 8:40 AM  [facility] [address], [address] [place], [place], [address] Phone: [phone]   Fax:  [phone]  Name: RUTHMARY OCCHIPINTI MRN: [id number] Date of Birth: Apr 23, 1975

## 2016-01-11 ENCOUNTER — Ambulatory Visit: Payer: PRIVATE HEALTH INSURANCE | Admitting: Physical Therapy

## 2016-01-11 ENCOUNTER — Encounter: Payer: Self-pay | Admitting: Physical Therapy

## 2016-01-11 DIAGNOSIS — M25612 Stiffness of left shoulder, not elsewhere classified: Secondary | ICD-10-CM | POA: Diagnosis not present

## 2016-01-11 DIAGNOSIS — M6281 Muscle weakness (generalized): Secondary | ICD-10-CM

## 2016-01-11 NOTE — Therapy (Signed)
[facility] [address] [place], [place], [address] Phone: [phone]   Fax:  [phone]  Physical Therapy Treatment  Patient Details  Name: Hailey Hopkins MRN: [id number] Date of Birth: [birth year] Referring Provider: Valma Cava, MD  Encounter Date: 01/11/2016      PT End of Session - 01/11/16 0853    Visit Number 57   Number of Visits 57   Date for PT Re-Evaluation 02/03/16   Authorization Type Workers Comp   Authorization - Visit Number 57   Authorization - Number of Visits 57   PT Start Time 0849   PT Stop Time 0930   PT Time Calculation (min) 41 min   Activity Tolerance Patient tolerated treatment well   Behavior During Therapy [facility] for tasks assessed/performed      Past Medical History:  Diagnosis Date  . Depression   . Diverticular disease     Past Surgical History:  Procedure Laterality Date  . ABDOMINAL HYSTERECTOMY    . ROTATOR CUFF REPAIR      There were no vitals filed for this visit.                       [facility] Adult PT Treatment/Exercise - 01/11/16 0001      Shoulder Exercises: ROM/Strengthening   UBE (Upper Arm Bike) L1  4x4   Concurrent review of status.   Wall Pushups --  3 x10, on counter top   Sustained Retraction with Theraband Low cable 1 plate 3J62 D2 flexion   Other ROM/Strengthening Exercises body blade 3 positions x 45 seconds   Other ROM/Strengthening Exercises 3 way raise 3# 15x     Shoulder Exercises: Power [employer] 20 reps   Row Limitations 20#   Other Power Museum/gallery curator lat bar 15# 3 x 10   Other Power Tower Exercises Chest press 2 plates G31, then 1 plate 5V76                  PT Short Term Goals - 01/02/16 1021      PT SHORT TERM GOAL #1   Title She will be independent with inital HEP    Baseline can reproduce correctly without cues.   Time 4   Period Weeks   Status Achieved     PT SHORT TERM GOAL #2   Title report a 25% reduction in Lt shoulder pain with use at home and work   Baseline AA arm to fix hair   Time 4   Period Weeks   Status Achieved     PT SHORT TERM GOAL #3   Title demonstrate Lt shoulder AROM flexion to 145 to improve use with self-care and overhead reaching   Baseline understands and uses RICE at home   Time 4   Period Weeks   Status Achieved           PT Long Term Goals - 01/06/16 1047      PT LONG TERM GOAL #2   Title improve FOTO to < or = to 46% limitation   Status Achieved  36% on 10/13     PT LONG TERM GOAL #3   Title report a 60% reduction in Lt shoulder pain with home tasks   Status Achieved     PT LONG TERM GOAL #4   Title demonstrate 4+/5 Lt shoulder strength throughout to improve endurance for Lt UE use   Status On-going  Plan - 01/11/16 0854    Clinical Impression Statement 3# still a challenge to shoulders, stuck with same weights. Pt demonstrates better mastery of scapular stabiliazation when doing the body blade. Pt is continually monitored throughout her session for pain & technique. She is also being successful at the gym, reports going every day, doing arms on non-PT days.    Rehab Potential Good   Clinical Impairments Affecting Rehab Potential 11/10/15 left arthroscopic SAD/DCR   PT Frequency 2x / week   PT Duration 4 weeks   PT Treatment/Interventions ADLs/Self Care Home Management;Cryotherapy;Electrical Stimulation;Iontophoresis 4mg /ml Dexamethasone;Moist Heat;Therapeutic exercise;Therapeutic activities;Functional mobility training;Ultrasound;Neuromuscular re-education;Patient/family education;Manual techniques;Taping;Dry needling;Passive range of motion;Vasopneumatic Device   PT Next Visit Plan Await approval [facility].   Consulted and Agree with Plan of Care Patient      Patient will benefit from skilled therapeutic intervention in order to improve the following deficits and impairments:  Postural dysfunction,  Decreased strength, Impaired flexibility, Pain, Impaired UE functional use, Decreased activity tolerance, Decreased range of motion, Decreased endurance  Visit Diagnosis: Muscle weakness (generalized)  Shoulder stiffness, left     Problem List Patient Active Problem List   Diagnosis Date Noted  . Insomnia 10/29/2014  . Plantar fasciitis 12/02/2013  . Benign essential HTN 05/28/2013  . Hepatic steatosis 04/12/2011  . Back pain 06/16/2010  . HEMORRHOIDS, EXTERNAL 12/21/2009  . DEPRESSION, MAJOR, MODERATE 04/04/2007  . HAIR LOSS 08/23/2006  . OBESITY NOS 05/28/2006  . FATIGUE 05/28/2006  . GLUCOSE INTOLERANCE 05/23/2006  . Migraine 05/23/2006    Sylvana Bonk, PTA 01/11/2016, 9:31 AM  [facility] [address], [address] [place], [place], [address] Phone: [phone]   Fax:  [phone]  Name: Hailey Hopkins MRN: [id number] Date of Birth: [birth year]

## 2016-01-16 ENCOUNTER — Ambulatory Visit: Payer: PRIVATE HEALTH INSURANCE | Admitting: Physical Therapy

## 2016-01-16 ENCOUNTER — Encounter: Payer: Self-pay | Admitting: Physical Therapy

## 2016-01-16 DIAGNOSIS — M25612 Stiffness of left shoulder, not elsewhere classified: Secondary | ICD-10-CM | POA: Diagnosis not present

## 2016-01-16 DIAGNOSIS — M25512 Pain in left shoulder: Secondary | ICD-10-CM

## 2016-01-16 DIAGNOSIS — M6281 Muscle weakness (generalized): Secondary | ICD-10-CM

## 2016-01-16 NOTE — Therapy (Signed)
[facility] [address], [address] [place], [place], [address] Phone: [phone]   Fax:  [phone]  Physical Therapy Treatment  Patient Details  Name: Hailey ButteryFelicia G Hopkins MRN: [id number] Date of Birth: 1976/03/14 Referring Provider: Valma Cavaollins, Andrew, MD  Encounter Date: 01/16/2016      PT End of Session - 01/16/16 1056    Visit Number 58   Date for PT Re-Evaluation 02/03/16   Authorization Type Workers Comp   PT Start Time 1012   PT Stop Time 1054   PT Time Calculation (min) 42 min   Activity Tolerance Patient tolerated treatment well   Behavior During Therapy [facility] for tasks assessed/performed      Past Medical History:  Diagnosis Date  . Depression   . Diverticular disease     Past Surgical History:  Procedure Laterality Date  . ABDOMINAL HYSTERECTOMY    . ROTATOR CUFF REPAIR      There were no vitals filed for this visit.      Subjective Assessment - 01/16/16 1018    Subjective Pt reports shoulder hurting a little. Pt is waiting to return to gym due to a seperate injury sustained at the gym that does not affect theraputic activities.    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  Lt shoulder surgery 12/14/2014 for partial rotator cuff repair and distal clavicle excision. March 15th injection into left shoulder under Ultrasound to break up scar tissue   Diagnostic tests injection: Cortizone 04/2015   Patient Stated Goals reduce pain, return to normal use of Lt UE   Currently in Pain? Yes   Pain Score 3    Pain Location Shoulder   Pain Orientation Left   Pain Descriptors / Indicators Sore   Pain Type Surgical pain                         [facility] Adult PT Treatment/Exercise - 01/16/16 0001      Shoulder Exercises: ROM/Strengthening   UBE (Upper Arm Bike) L1  4x4   Concurrent review of status.   Wall Pushups --  3 x10, on counter top   Sustained Retraction with Theraband  Low cable 1 plate 4Q032x15 D2 flexion   Other ROM/Strengthening Exercises body blade 3 positions x 45 seconds   Other ROM/Strengthening Exercises 3 way raise 3# 15x     Shoulder Exercises: Power [employer] Special[employer]ower   Row 20 reps   Row Limitations 20#   Other Power Museum/gallery curatorTower Exercises lat bar 15# 3 x 10   Other Power Tower Exercises Chest press 2 plates K74x10, then 1 plate 2V952x10                  PT Short Term Goals - 01/16/16 1058      PT SHORT TERM GOAL #1   Title She will be independent with inital HEP    Baseline can reproduce correctly without cues.   Time 4   Period Weeks   Status Achieved     PT SHORT TERM GOAL #2   Title report a 25% reduction in Lt shoulder pain with use at home and work   Baseline AA arm to fix hair   Time 4   Period Weeks   Status Achieved     PT SHORT TERM GOAL #3   Title demonstrate Lt shoulder AROM flexion to 145 to improve use with self-care and overhead reaching   Baseline understands and uses RICE at home  Period Days   Status Achieved           PT Long Term Goals - 01/16/16 1059      PT LONG TERM GOAL #1   Title be independent in advanced HEP   Time 8   Period Weeks   Status On-going     PT LONG TERM GOAL #2   Title improve FOTO to < or = to 46% limitation   Baseline 110 today   Time 8   Period Weeks   Status Achieved     PT LONG TERM GOAL #3   Title report a 60% reduction in Lt shoulder pain with home tasks   Baseline more painful in the last week   Time 8   Period Weeks   Status Achieved     PT LONG TERM GOAL #4   Title demonstrate 4+/5 Lt shoulder strength throughout to improve endurance for Lt UE use   Baseline Pain with endrange left shoulder flexion   Time 8   Period Weeks   Status On-going     PT LONG TERM GOAL #5   Title able to lift to waist and carry 10 pounds or more to increase home and work participation as MD allows   Baseline able to do in clinic    Time 8   Period Weeks   Status On-going     PT LONG TERM  GOAL #6   Title demonstrate Lt shoulder IR to lacking < or = to 2 inches vs the Rt to improve self-care/dressing   Time 8   Period Weeks   Status Achieved               Plan - 01/16/16 1046    Clinical Impression Statement Pt continues to be challenged by current resistance level. Pt has had better scapular control during shoulder movement. Pt will continue to progress with skilled therapy for shoulder strengthening and stabilization.    Rehab Potential Good   Clinical Impairments Affecting Rehab Potential 11/10/15 left arthroscopic SAD/DCR   PT Frequency 2x / week   PT Duration 4 weeks   PT Treatment/Interventions ADLs/Self Care Home Management;Cryotherapy;Electrical Stimulation;Iontophoresis 4mg /ml Dexamethasone;Moist Heat;Therapeutic exercise;Therapeutic activities;Functional mobility training;Ultrasound;Neuromuscular re-education;Patient/family education;Manual techniques;Taping;Dry needling;Passive range of motion;Vasopneumatic Device   PT Home Exercise Plan progress as needed   Consulted and Agree with Plan of Care Patient      Patient will benefit from skilled therapeutic intervention in order to improve the following deficits and impairments:  Postural dysfunction, Decreased strength, Impaired flexibility, Pain, Impaired UE functional use, Decreased activity tolerance, Decreased range of motion, Decreased endurance  Visit Diagnosis: Muscle weakness (generalized)  Shoulder stiffness, left  Acute pain of left shoulder  Muscle weakness of left arm     Problem List Patient Active Problem List   Diagnosis Date Noted  . Insomnia 10/29/2014  . Plantar fasciitis 12/02/2013  . Benign essential HTN 05/28/2013  . Hepatic steatosis 04/12/2011  . Back pain 06/16/2010  . HEMORRHOIDS, EXTERNAL 12/21/2009  . DEPRESSION, MAJOR, MODERATE 04/04/2007  . HAIR LOSS 08/23/2006  . OBESITY NOS 05/28/2006  . FATIGUE 05/28/2006  . GLUCOSE INTOLERANCE 05/23/2006  . Migraine  05/23/2006    Dessa Phi PTA 01/16/2016, 11:10 AM  [facility] [address] [place], [place], [address] Phone: [phone]   Fax:  [phone]  Name: KOBI ALLER MRN: [id number] Date of Birth: 05/26/1975

## 2016-01-18 ENCOUNTER — Ambulatory Visit: Payer: PRIVATE HEALTH INSURANCE | Admitting: Physical Therapy

## 2016-01-18 ENCOUNTER — Encounter: Payer: Self-pay | Admitting: Physical Therapy

## 2016-01-18 DIAGNOSIS — M25612 Stiffness of left shoulder, not elsewhere classified: Secondary | ICD-10-CM

## 2016-01-18 DIAGNOSIS — M6281 Muscle weakness (generalized): Secondary | ICD-10-CM

## 2016-01-18 DIAGNOSIS — M25512 Pain in left shoulder: Secondary | ICD-10-CM

## 2016-01-18 DIAGNOSIS — G8929 Other chronic pain: Secondary | ICD-10-CM

## 2016-01-18 NOTE — Therapy (Signed)
[facility] [address], [address] [place], [place], [address] Phone: [phone]   Fax:  [phone]  Physical Therapy Treatment  Patient Details  Name: Hailey Hopkins MRN: [id number] Date of Birth: 10/07/1975 Referring Provider: Valma Cava, MD  Encounter Date: 01/18/2016      PT End of Session - 01/18/16 0855    Visit Number 59   Number of Visits 65   Date for PT Re-Evaluation 02/03/16   Authorization Type Workers Comp   Authorization - Visit Number 58   Authorization - Number of Visits 65   PT Start Time 0848   PT Stop Time 0926   PT Time Calculation (min) 38 min   Activity Tolerance Patient tolerated treatment well   Behavior During Therapy [facility] for tasks assessed/performed      Past Medical History:  Diagnosis Date  . Depression   . Diverticular disease     Past Surgical History:  Procedure Laterality Date  . ABDOMINAL HYSTERECTOMY    . ROTATOR CUFF REPAIR      There were no vitals filed for this visit.      Subjective Assessment - 01/18/16 0852    Subjective Pt reports shoulder hurting today.    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  Lt shoulder surgery 12/14/2014 for partial rotator cuff repair and distal clavicle excision. March 15th injection into left shoulder under Ultrasound to break up scar tissue   Diagnostic tests injection: Cortizone 04/2015   Patient Stated Goals reduce pain, return to normal use of Lt UE   Currently in Pain? Yes   Pain Score 4    Pain Location Shoulder   Pain Orientation Left   Pain Descriptors / Indicators Sore   Pain Type Surgical pain                         [facility] Adult PT Treatment/Exercise - 01/18/16 0001      Shoulder Exercises: Standing   Other Standing Exercises Snow angles at wall      Shoulder Exercises: ROM/Strengthening   UBE (Upper Arm Bike) L1  4x4   Concurrent review of status.   Wall Pushups --  3 x10,  on counter top   Sustained Retraction with Theraband Low cable 1 plate 6N62 D2 flexion   Other ROM/Strengthening Exercises body blade 3 positions x 45 seconds   Other ROM/Strengthening Exercises 3 way raise 3# 15x     Shoulder Exercises: Power [employer] 20 reps   Row Limitations 20#   Other Power Museum/gallery curator lat bar 15# 3 x 10   Other Power Tower Exercises Chest press 2 plates X52, then 1 plate 8U13                  PT Short Term Goals - 01/16/16 1058      PT SHORT TERM GOAL #1   Title She will be independent with inital HEP    Baseline can reproduce correctly without cues.   Time 4   Period Weeks   Status Achieved     PT SHORT TERM GOAL #2   Title report a 25% reduction in Lt shoulder pain with use at home and work   Baseline AA arm to fix hair   Time 4   Period Weeks   Status Achieved     PT SHORT TERM GOAL #3   Title demonstrate Lt shoulder AROM flexion to 145 to improve use  with self-care and overhead reaching   Baseline understands and uses RICE at home   Period Days   Status Achieved           PT Long Term Goals - 01/16/16 1059      PT LONG TERM GOAL #1   Title be independent in advanced HEP   Time 8   Period Weeks   Status On-going     PT LONG TERM GOAL #2   Title improve FOTO to < or = to 46% limitation   Baseline 110 today   Time 8   Period Weeks   Status Achieved     PT LONG TERM GOAL #3   Title report a 60% reduction in Lt shoulder pain with home tasks   Baseline more painful in the last week   Time 8   Period Weeks   Status Achieved     PT LONG TERM GOAL #4   Title demonstrate 4+/5 Lt shoulder strength throughout to improve endurance for Lt UE use   Baseline Pain with endrange left shoulder flexion   Time 8   Period Weeks   Status On-going     PT LONG TERM GOAL #5   Title able to lift to waist and carry 10 pounds or more to increase home and work participation as MD allows   Baseline able to do in clinic    Time 8    Period Weeks   Status On-going     PT LONG TERM GOAL #6   Title demonstrate Lt shoulder IR to lacking < or = to 2 inches vs the Rt to improve self-care/dressing   Time 8   Period Weeks   Status Achieved               Plan - 01/18/16 0934    Clinical Impression Statement Pt having pain in shoulder joint with certain movements and positions. Pt continues to be challenged by current level of resistance with exercies but with less substituion and good form. Pt will continue to benefir from skilled therapy to improve shoulder biomechanics and strength.    Rehab Potential Good   Clinical Impairments Affecting Rehab Potential 11/10/15 left arthroscopic SAD/DCR   PT Frequency 2x / week   PT Duration 4 weeks   PT Treatment/Interventions ADLs/Self Care Home Management;Cryotherapy;Electrical Stimulation;Iontophoresis 4mg /ml Dexamethasone;Moist Heat;Therapeutic exercise;Therapeutic activities;Functional mobility training;Ultrasound;Neuromuscular re-education;Patient/family education;Manual techniques;Taping;Dry needling;Passive range of motion;Vasopneumatic Device   PT Home Exercise Plan progress as needed   Consulted and Agree with Plan of Care Patient      Patient will benefit from skilled therapeutic intervention in order to improve the following deficits and impairments:  Postural dysfunction, Decreased strength, Impaired flexibility, Pain, Impaired UE functional use, Decreased activity tolerance, Decreased range of motion, Decreased endurance  Visit Diagnosis: Shoulder stiffness, left  Muscle weakness (generalized)  Acute pain of left shoulder  Muscle weakness of left arm  Chronic left shoulder pain     Problem List Patient Active Problem List   Diagnosis Date Noted  . Insomnia 10/29/2014  . Plantar fasciitis 12/02/2013  . Benign essential HTN 05/28/2013  . Hepatic steatosis 04/12/2011  . Back pain 06/16/2010  . HEMORRHOIDS, EXTERNAL 12/21/2009  . DEPRESSION, MAJOR,  MODERATE 04/04/2007  . HAIR LOSS 08/23/2006  . OBESITY NOS 05/28/2006  . FATIGUE 05/28/2006  . GLUCOSE INTOLERANCE 05/23/2006  . Migraine 05/23/2006    Dessa PhiKatherine Deshanda Molitor PTA 01/18/2016, 10:33 AM  [facility] [address], [address]  [place], [place], [address] Phone: [phone]   Fax:  [phone]  Name: Hailey Hopkins MRN: [id number] Date of Birth: [birth year]

## 2016-01-23 ENCOUNTER — Ambulatory Visit: Payer: PRIVATE HEALTH INSURANCE | Admitting: Physical Therapy

## 2016-01-23 ENCOUNTER — Encounter: Payer: Self-pay | Admitting: Physical Therapy

## 2016-01-23 DIAGNOSIS — M6281 Muscle weakness (generalized): Secondary | ICD-10-CM

## 2016-01-23 DIAGNOSIS — M25512 Pain in left shoulder: Secondary | ICD-10-CM

## 2016-01-23 DIAGNOSIS — M25612 Stiffness of left shoulder, not elsewhere classified: Secondary | ICD-10-CM

## 2016-01-23 NOTE — Therapy (Signed)
[facility] [address], [address] [place], [place], [address] Phone: [phone]   Fax:  [phone]  Physical Therapy Treatment  Patient Details  Name: Hailey ButteryFelicia G Freelove MRN: [id number] Date of Birth: 1975-09-12 Referring Provider: Valma Cavaollins, Andrew, MD  Encounter Date: 01/23/2016      PT End of Session - 01/23/16 1052    Visit Number 60   Number of Visits 65   Date for PT Re-Evaluation 02/03/16   Authorization Type Workers Comp   Authorization - Visit Number 59   Authorization - Number of Visits 65   PT Start Time 1015   PT Stop Time 1053   PT Time Calculation (min) 38 min   Activity Tolerance Patient tolerated treatment well   Behavior During Therapy WFL for tasks assessed/performed      Past Medical History:  Diagnosis Date  . Depression   . Diverticular disease     Past Surgical History:  Procedure Laterality Date  . ABDOMINAL HYSTERECTOMY    . ROTATOR CUFF REPAIR      There were no vitals filed for this visit.      Subjective Assessment - 01/23/16 1031    Subjective Pt reports she is more sore after walking a lot at her son's field trip last week. 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  Lt shoulder surgery 12/14/2014 for partial rotator cuff repair and distal clavicle excision. March 15th injection into left shoulder under Ultrasound to break up scar tissue   Diagnostic tests injection: Cortizone 04/2015   Patient Stated Goals reduce pain, return to normal use of Lt UE   Currently in Pain? Yes   Pain Score 6    Pain Location Shoulder   Pain Orientation Left   Pain Descriptors / Indicators Sore   Pain Type Surgical pain   Pain Onset More than a month ago   Multiple Pain Sites No                         [facility] Adult PT Treatment/Exercise - 01/23/16 0001      Shoulder Exercises: Standing   Other Standing Exercises snow angels at  wall 2 x 10     Shoulder Exercises: ROM/Strengthening   UBE (Upper Arm Bike) L1 4x4  review of status   Wall Pushups --  pt unable to tolerate today   Sustained Retraction with Theraband low cable 15# D2 15 x 2   Other ROM/Strengthening Exercises body blade 3  positions x 45 seconds each     Shoulder Exercises: Power [employer]   Row 20 reps   Row Limitations 20#   Other Power Museum/gallery curatorTower Exercises lat bar 15# 3 x 10   Other Power Tower Exercises chest press 2 plates 2 x 10                  PT Short Term Goals - 01/23/16 1054      PT SHORT TERM GOAL #1   Title She will be independent with inital HEP    Status Achieved     PT SHORT TERM GOAL #2   Title report a 25% reduction in Lt shoulder pain with use at home and work   Status Achieved     PT SHORT TERM GOAL #3   Title demonstrate Lt shoulder AROM flexion to 145 to improve use with self-care and overhead reaching   Status Achieved  PT Long Term Goals - 01/23/16 1054      PT LONG TERM GOAL #1   Title be independent in advanced HEP   Status On-going     PT LONG TERM GOAL #2   Title improve FOTO to < or = to 46% limitation   Status Achieved     PT LONG TERM GOAL #3   Title report a 60% reduction in Lt shoulder pain with home tasks   Status Achieved     PT LONG TERM GOAL #4   Title demonstrate 4+/5 Lt shoulder strength throughout to improve endurance for Lt UE use   Time 8   Period Weeks   Status On-going     PT LONG TERM GOAL #5   Title able to lift to waist and carry 10 pounds or more to increase home and work participation as MD allows   Time 8   Period Weeks   Status On-going     PT LONG TERM GOAL #6   Title demonstrate Lt shoulder IR to lacking < or = to 2 inches vs the Rt to improve self-care/dressing   Status Achieved               Plan - 01/23/16 1053    Clinical Impression Statement Pt limited by increased shoulder pain today. Continues to be challenged by current level of  exercise   Rehab Potential Good   Clinical Impairments Affecting Rehab Potential 11/10/15 left arthroscopic SAD/DCR   PT Frequency 2x / week   PT Duration 4 weeks   PT Treatment/Interventions ADLs/Self Care Home Management;Cryotherapy;Electrical Stimulation;Iontophoresis 4mg /ml Dexamethasone;Moist Heat;Therapeutic exercise;Therapeutic activities;Functional mobility training;Ultrasound;Neuromuscular re-education;Patient/family education;Manual techniques;Taping;Dry needling;Passive range of motion;Vasopneumatic Device   PT Next Visit Plan progress as tolerated   PT Home Exercise Plan progress as needed   Consulted and Agree with Plan of Care Patient      Patient will benefit from skilled therapeutic intervention in order to improve the following deficits and impairments:  Postural dysfunction, Decreased strength, Impaired flexibility, Pain, Impaired UE functional use, Decreased activity tolerance, Decreased range of motion, Decreased endurance  Visit Diagnosis: Acute pain of left shoulder  Shoulder stiffness, left  Muscle weakness (generalized)  Muscle weakness of left arm     Problem List Patient Active Problem List   Diagnosis Date Noted  . Insomnia 10/29/2014  . Plantar fasciitis 12/02/2013  . Benign essential HTN 05/28/2013  . Hepatic steatosis 04/12/2011  . Back pain 06/16/2010  . HEMORRHOIDS, EXTERNAL 12/21/2009  . DEPRESSION, MAJOR, MODERATE 04/04/2007  . HAIR LOSS 08/23/2006  . OBESITY NOS 05/28/2006  . FATIGUE 05/28/2006  . GLUCOSE INTOLERANCE 05/23/2006  . Migraine 05/23/2006    Reggy EyeKaren Pernella Ackerley, PT, DPT 01/23/2016, 10:56 AM  [facility] [address], [address] [place], [place], [address] Phone: [phone]   Fax:  [phone]  Name: Hailey ButteryFelicia G Simonet MRN: [id number] Date of Birth: 1975-07-21

## 2016-01-25 ENCOUNTER — Encounter: Payer: Self-pay | Admitting: Physical Therapy

## 2016-01-25 ENCOUNTER — Ambulatory Visit: Payer: PRIVATE HEALTH INSURANCE | Attending: Surgical | Admitting: Physical Therapy

## 2016-01-25 DIAGNOSIS — M25512 Pain in left shoulder: Secondary | ICD-10-CM | POA: Insufficient documentation

## 2016-01-25 DIAGNOSIS — M25612 Stiffness of left shoulder, not elsewhere classified: Secondary | ICD-10-CM | POA: Diagnosis present

## 2016-01-25 DIAGNOSIS — M6281 Muscle weakness (generalized): Secondary | ICD-10-CM

## 2016-01-25 NOTE — Therapy (Signed)
[facility] [address], [address] [place], [place], [address] Phone: [phone]   Fax:  [phone]  Physical Therapy Treatment  Patient Details  Name: Hailey ButteryFelicia G Kuper MRN: [id number] Date of Birth: Mar 07, 1976 Referring Provider: Valma Cavaollins, Andrew, MD  Encounter Date: 01/25/2016      PT End of Session - 01/25/16 0943    Visit Number 61   Number of Visits 65   Date for PT Re-Evaluation 02/03/16   Authorization - Visit Number 61   Authorization - Number of Visits 65   PT Start Time 0931   PT Stop Time 1015   PT Time Calculation (min) 44 min   Activity Tolerance Patient tolerated treatment well   Behavior During Therapy [facility] for tasks assessed/performed      Past Medical History:  Diagnosis Date  . Depression   . Diverticular disease     Past Surgical History:  Procedure Laterality Date  . ABDOMINAL HYSTERECTOMY    . ROTATOR CUFF REPAIR      There were no vitals filed for this visit.                       [facility] Adult PT Treatment/Exercise - 01/25/16 0001      Shoulder Exercises: Seated   Other Seated Exercises 3# 3 way raise 15x     Shoulder Exercises: ROM/Strengthening   UBE (Upper Arm Bike) L1 4x4  review of status     Shoulder Exercises: Power [employer]   Row 20 reps   Row Limitations 20#   ABduction --  D2 flexion 2 plates [id number]   Other Power Architectural technologistTower Exercises lat bar 15# 3 x 10   Other Power Tower Exercises chest press 2 plates 2 x 10     Shoulder Exercises: Body Blade   Flexion 30 seconds;5 reps  2x5   ABduction 30 seconds  2x5   Other Body Blade Exercises Scaption 10x                  PT Short Term Goals - 01/23/16 1054      PT SHORT TERM GOAL #1   Title She will be independent with inital HEP    Status Achieved     PT SHORT TERM GOAL #2   Title report a 25% reduction in Lt shoulder pain with use at home and work   Status Achieved     PT SHORT TERM GOAL #3   Title demonstrate Lt shoulder AROM flexion to 145 to improve use with self-care and overhead reaching   Status Achieved           PT Long Term Goals - 01/25/16 0947      PT LONG TERM GOAL #1   Title be independent in advanced HEP   Time 8   Period Weeks   Status Achieved  Going to the gym consistently now               Plan - 01/25/16 0943    Clinical Impression Statement Pt nearing the end of her authorization and continues to do well with her exercises in the clinic and at the gym.    Rehab Potential Good   Clinical Impairments Affecting Rehab Potential 11/10/15 left arthroscopic SAD/DCR   PT Frequency 2x / week   PT Duration 4 weeks   PT Treatment/Interventions ADLs/Self Care Home Management;Cryotherapy;Electrical Stimulation;Iontophoresis 4mg /ml Dexamethasone;Moist Heat;Therapeutic exercise;Therapeutic activities;Functional mobility training;Ultrasound;Neuromuscular re-education;Patient/family education;Manual techniques;Taping;Dry needling;Passive range of motion;Vasopneumatic Device  PT Next Visit Plan progress as tolerated focus on strength and endurance   Consulted and Agree with Plan of Care Patient      Patient will benefit from skilled therapeutic intervention in order to improve the following deficits and impairments:  Postural dysfunction, Decreased strength, Impaired flexibility, Pain, Impaired UE functional use, Decreased activity tolerance, Decreased range of motion, Decreased endurance  Visit Diagnosis: Muscle weakness (generalized)  Shoulder stiffness, left     Problem List Patient Active Problem List   Diagnosis Date Noted  . Insomnia 10/29/2014  . Plantar fasciitis 12/02/2013  . Benign essential HTN 05/28/2013  . Hepatic steatosis 04/12/2011  . Back pain 06/16/2010  . HEMORRHOIDS, EXTERNAL 12/21/2009  . DEPRESSION, MAJOR, MODERATE 04/04/2007  . HAIR LOSS 08/23/2006  . OBESITY NOS 05/28/2006  . FATIGUE 05/28/2006  . GLUCOSE INTOLERANCE  05/23/2006  . Migraine 05/23/2006    COCHRAN,JENNIFER, PTA 01/25/2016, 10:12 AM  [facility] [address], [address] [place], [place], [address] Phone: [phone]   Fax:  [phone]  Name: Hailey ButteryFelicia G Purpura MRN: [id number] Date of Birth: 08/29/1975

## 2016-01-27 ENCOUNTER — Other Ambulatory Visit: Payer: Self-pay | Admitting: Family Medicine

## 2016-01-27 DIAGNOSIS — F321 Major depressive disorder, single episode, moderate: Secondary | ICD-10-CM

## 2016-01-27 MED FILL — MELOXICAM 15 MG TABLET: 15 | 30 days supply | Qty: 30 | Fill #1

## 2016-01-27 MED FILL — METHOCARBAMOL 500 MG TABLET: 500 | 17 days supply | Qty: 50 | Fill #1

## 2016-01-30 ENCOUNTER — Encounter: Payer: Self-pay | Admitting: Physical Therapy

## 2016-02-01 ENCOUNTER — Encounter: Payer: Self-pay | Admitting: Physical Therapy

## 2016-02-01 ENCOUNTER — Other Ambulatory Visit: Payer: Self-pay | Admitting: *Deleted

## 2016-02-01 DIAGNOSIS — F321 Major depressive disorder, single episode, moderate: Secondary | ICD-10-CM

## 2016-02-14 ENCOUNTER — Ambulatory Visit: Payer: PRIVATE HEALTH INSURANCE

## 2016-02-14 DIAGNOSIS — M25512 Pain in left shoulder: Secondary | ICD-10-CM

## 2016-02-14 DIAGNOSIS — M6281 Muscle weakness (generalized): Secondary | ICD-10-CM

## 2016-02-14 DIAGNOSIS — M25612 Stiffness of left shoulder, not elsewhere classified: Secondary | ICD-10-CM

## 2016-02-14 NOTE — Therapy (Signed)
[facility] [address], [address] [place], [place], [address] Phone: [phone]   Fax:  [phone]  Physical Therapy Treatment  Patient Details  Name: Hailey Hopkins MRN: [id number] Date of Birth: 1976/02/02 Referring Provider: Valma Cavaollins, Andrew, MD  Encounter Date: 02/14/2016      PT End of Session - 02/14/16 1052    Visit Number 62   Number of Visits 65   Date for PT Re-Evaluation 03/20/16   Authorization Type Workers Comp   Authorization - Visit Number 62   Authorization - Number of Visits 65   PT Start Time 1013   PT Stop Time 1056   PT Time Calculation (min) 43 min   Activity Tolerance Patient tolerated treatment well   Behavior During Therapy WFL for tasks assessed/performed      Past Medical History:  Diagnosis Date  . Depression   . Diverticular disease     Past Surgical History:  Procedure Laterality Date  . ABDOMINAL HYSTERECTOMY    . ROTATOR CUFF REPAIR      There were no vitals filed for this visit.      Subjective Assessment - 02/14/16 1026    Subjective Saw MD 02/08/16 and he wants her to work on getting stronger.  Still having Lt shoulder pain.     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  Lt shoulder surgery 12/14/2014 for partial rotator cuff repair and distal clavicle excision. March 15th injection into left shoulder under Ultrasound to break up scar tissue   Currently in Pain? Yes   Pain Score 3    Pain Location Shoulder   Pain Orientation Left   Pain Descriptors / Indicators Sore;Aching   Pain Type Chronic pain   Pain Onset More than a month ago   Pain Frequency Constant   Aggravating Factors  unknown at times   Pain Relieving Factors rest, ice/heat            [facility] - 02/14/16 0001      Assessment   Medical Diagnosis post-op stiffness of Lt shoulder, Lt pectoralis strain, distal clavicle excision, repair of partial tear of rotator cuff and  bone spur     Precautions   Precautions None     Observation/Other Assessments   Focus on Therapeutic Outcomes (FOTO)  38% limitation     AROM   Left Shoulder Flexion 150 Degrees   Left Shoulder ABduction 140 Degrees   Left Shoulder Internal Rotation --  L1   Left Shoulder External Rotation --  T2     Strength   Left Shoulder Flexion 4+/5   Left Shoulder ABduction 4+/5   Left Shoulder Internal Rotation 5/5   Left Shoulder External Rotation 5/5                     [facility] Adult PT Treatment/Exercise - 02/14/16 0001      Shoulder Exercises: Seated   Other Seated Exercises 3# 3 way raise 2x15 reps each     Shoulder Exercises: ROM/Strengthening   UBE (Upper Arm Bike) L1 4x4  review of status     Shoulder Exercises: Power [employer]   Row 20 reps   Row Limitations 25#   Other Power Museum/gallery curatorTower Exercises lat bar 20# 3 x 10   Other Power Tower Exercises chest press 2 plates 2 x 10     Shoulder Exercises: Body Blade   Flexion 30 seconds;5 reps  2x5   ABduction 30 seconds  2x5  Other Body Blade Exercises Scaption 10x                  PT Short Term Goals - 01/23/16 1054      PT SHORT TERM GOAL #1   Title She will be independent with inital HEP    Status Achieved     PT SHORT TERM GOAL #2   Title report a 25% reduction in Lt shoulder pain with use at home and work   Status Achieved     PT SHORT TERM GOAL #3   Title demonstrate Lt shoulder AROM flexion to 145 to improve use with self-care and overhead reaching   Status Achieved           PT Long Term Goals - 02/14/16 1029      PT LONG TERM GOAL #1   Title be independent in advanced HEP   Time 5   Period Weeks   Status On-going     PT LONG TERM GOAL #2   Title improve FOTO to < or = to 46% limitation   Status Achieved     PT LONG TERM GOAL #3   Title improve Lt shoulder strength to lift a gallon of milk out of fridge without difficulty   Time 5   Period Weeks   Status New     PT LONG  TERM GOAL #4   Title demonstrate 4+/5 Lt shoulder strength throughout to improve endurance for Lt UE use   Status Achieved     PT LONG TERM GOAL #5   Title able to lift to waist and carry 10 pounds or more to improve independence with home tasks   Time 5   Period Weeks   Status Revised     PT LONG TERM GOAL #6   Title improve Lt shoulder strength to lift object from a top shelf without difficulty or limitation   Time 5   Period Weeks   Status New               Plan - 02/14/16 1037    Clinical Impression Statement Pt is tolerating advancement of strength exercises in the clinic.  Strength is improving and pt reports difficulty with eccentric movement with getting an object out of cabinet and with lifting a gallon of milk out of the fridge.  Pt will continue to benefit from skilled PT for Lt shoulder strength progression, stabilization and endurance tasks to allow for return to use of Lt UE with home tasks without limitation.     Rehab Potential Good   Clinical Impairments Affecting Rehab Potential 11/10/15 left arthroscopic SAD/DCR   PT Frequency 2x / week   PT Duration 6 weeks  5 weeks   PT Treatment/Interventions ADLs/Self Care Home Management;Cryotherapy;Electrical Stimulation;Iontophoresis 4mg /ml Dexamethasone;Moist Heat;Therapeutic exercise;Therapeutic activities;Functional mobility training;Ultrasound;Neuromuscular re-education;Patient/family education;Manual techniques;Taping;Dry needling;Passive range of motion;Vasopneumatic Device   PT Next Visit Plan progress as tolerated focus on strength and endurance   Consulted and Agree with Plan of Care Patient      Patient will benefit from skilled therapeutic intervention in order to improve the following deficits and impairments:  Postural dysfunction, Decreased strength, Impaired flexibility, Pain, Impaired UE functional use, Decreased activity tolerance, Decreased range of motion, Decreased endurance  Visit  Diagnosis: Muscle weakness (generalized) - Plan: PT plan of care cert/re-cert  Shoulder stiffness, left - Plan: PT plan of care cert/re-cert  Acute pain of left shoulder - Plan: PT plan of care cert/re-cert     Problem List  Patient Active Problem List   Diagnosis Date Noted  . Insomnia 10/29/2014  . Plantar fasciitis 12/02/2013  . Benign essential HTN 05/28/2013  . Hepatic steatosis 04/12/2011  . Back pain 06/16/2010  . HEMORRHOIDS, EXTERNAL 12/21/2009  . DEPRESSION, MAJOR, MODERATE 04/04/2007  . HAIR LOSS 08/23/2006  . OBESITY NOS 05/28/2006  . FATIGUE 05/28/2006  . GLUCOSE INTOLERANCE 05/23/2006  . Migraine 05/23/2006     [name], PT 02/14/16 10:55 AM  [facility] [address] [place], [place], [address] Phone: [phone]   Fax:  [phone]  Name: Hailey Hopkins MRN: [id number] Date of Birth: Feb 19, 1976

## 2016-02-21 ENCOUNTER — Ambulatory Visit: Payer: PRIVATE HEALTH INSURANCE | Admitting: Physical Therapy

## 2016-02-21 ENCOUNTER — Encounter: Payer: Self-pay | Admitting: Physical Therapy

## 2016-02-21 DIAGNOSIS — M6281 Muscle weakness (generalized): Secondary | ICD-10-CM | POA: Diagnosis not present

## 2016-02-21 DIAGNOSIS — M25612 Stiffness of left shoulder, not elsewhere classified: Secondary | ICD-10-CM

## 2016-02-21 DIAGNOSIS — M25512 Pain in left shoulder: Secondary | ICD-10-CM

## 2016-02-21 NOTE — Therapy (Signed)
[facility] [address], [address] [place], [place], [address] Phone: [phone]   Fax:  [phone]  Physical Therapy Treatment  Patient Details  Name: Hailey Hopkins MRN: [id number] Date of Birth: 05-20-1975 Referring Provider: Valma Cavaollins, Andrew, MD  Encounter Date: 02/21/2016      PT End of Session - 02/21/16 1024    Visit Number 63   Number of Visits 65   Date for PT Re-Evaluation 03/20/16   Authorization Type Workers Comp   Authorization - Visit Number 62   Authorization - Number of Visits 65   PT Start Time 1018   PT Stop Time 1057   PT Time Calculation (min) 39 min   Activity Tolerance Patient tolerated treatment well   Behavior During Therapy WFL for tasks assessed/performed      Past Medical History:  Diagnosis Date  . Depression   . Diverticular disease     Past Surgical History:  Procedure Laterality Date  . ABDOMINAL HYSTERECTOMY    . ROTATOR CUFF REPAIR      There were no vitals filed for this visit.      Subjective Assessment - 02/21/16 1020    Subjective Pt reports shoulder feeling ok, everything about the same.    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  Lt shoulder surgery 12/14/2014 for partial rotator cuff repair and distal clavicle excision. March 15th injection into left shoulder under Ultrasound to break up scar tissue   Diagnostic tests injection: Cortizone 04/2015   Patient Stated Goals reduce pain, return to normal use of Lt UE   Currently in Pain? Yes   Pain Score 2    Pain Location Shoulder   Pain Orientation Left   Pain Descriptors / Indicators Aching;Sore   Pain Type Chronic pain   Pain Onset More than a month ago   Pain Frequency Constant                         [facility] Adult PT Treatment/Exercise - 02/21/16 0001      Shoulder Exercises: Seated   Other Seated Exercises 3# 3 way raise 2x15 reps each     Shoulder Exercises: Standing   Internal Rotation AAROM;Both  x10 cane   Flexion AAROM;Both  Stretching x10 cane   ABduction AAROM;Both  x10 cane     Shoulder Exercises: ROM/Strengthening   UBE (Upper Arm Bike) L1 4x4  review of status     Shoulder Exercises: Power [employer] Specialty Chemicalsower   Row 20 reps   Row Limitations 25#   Other Power Sun[employer]ower Exercises lat bar 20# 3 x 10   Other Power Tower Exercises chest press 2 plates 2 x 10     Shoulder Exercises: Body Blade   Flexion 30 seconds;5 reps  2x5   ABduction 30 seconds  2x5   Other Body Blade Exercises Scaption 10x                  PT Short Term Goals - 01/23/16 1054      PT SHORT TERM GOAL #1   Title She will be independent with inital HEP    Status Achieved     PT SHORT TERM GOAL #2   Title report a 25% reduction in Lt shoulder pain with use at home and work   Status Achieved     PT SHORT TERM GOAL #3   Title demonstrate Lt shoulder AROM flexion to 145 to improve use with self-care and  overhead reaching   Status Achieved           PT Long Term Goals - 02/21/16 1026      PT LONG TERM GOAL #3   Title improve Lt shoulder strength to lift a gallon of milk out of fridge without difficulty   Baseline more painful in the last week   Time 5   Period Weeks   Status On-going     PT LONG TERM GOAL #4   Title demonstrate 4+/5 Lt shoulder strength throughout to improve endurance for Lt UE use   Baseline Pain with endrange left shoulder flexion   Time 8   Period Weeks   Status Achieved     PT LONG TERM GOAL #5   Title able to lift to waist and carry 10 pounds or more to improve independence with home tasks   Baseline able to do in clinic    Time 5   Period Weeks   Status On-going     PT LONG TERM GOAL #6   Title improve Lt shoulder strength to lift object from a top shelf without difficulty or limitation   Time 5   Period Weeks   Status On-going               Plan - 02/21/16 1053    Clinical Impression Statement Pt continues to be  challeged by current level of resistance. Will continue to progress with eccentric and isometric strengthening for daily activities. Pt will continue to benefit from skille therapy for shoulder strength and endurance.    Rehab Potential Good   Clinical Impairments Affecting Rehab Potential 11/10/15 left arthroscopic SAD/DCR   PT Frequency 2x / week   PT Duration 6 weeks   PT Treatment/Interventions ADLs/Self Care Home Management;Cryotherapy;Electrical Stimulation;Iontophoresis 4mg /ml Dexamethasone;Moist Heat;Therapeutic exercise;Therapeutic activities;Functional mobility training;Ultrasound;Neuromuscular re-education;Patient/family education;Manual techniques;Taping;Dry needling;Passive range of motion;Vasopneumatic Device   PT Next Visit Plan progress as tolerated focus on strength and endurance   Consulted and Agree with Plan of Care Patient      Patient will benefit from skilled therapeutic intervention in order to improve the following deficits and impairments:  Postural dysfunction, Decreased strength, Impaired flexibility, Pain, Impaired UE functional use, Decreased activity tolerance, Decreased range of motion, Decreased endurance  Visit Diagnosis: Muscle weakness (generalized)  Shoulder stiffness, left  Acute pain of left shoulder     Problem List Patient Active Problem List   Diagnosis Date Noted  . Insomnia 10/29/2014  . Plantar fasciitis 12/02/2013  . Benign essential HTN 05/28/2013  . Hepatic steatosis 04/12/2011  . Back pain 06/16/2010  . HEMORRHOIDS, EXTERNAL 12/21/2009  . DEPRESSION, MAJOR, MODERATE 04/04/2007  . HAIR LOSS 08/23/2006  . OBESITY NOS 05/28/2006  . FATIGUE 05/28/2006  . GLUCOSE INTOLERANCE 05/23/2006  . Migraine 05/23/2006    Dessa PhiKatherine Donita Newland PTA 02/21/2016, 10:55 AM   [facility] [address], [address] [place], [place], [address] Phone: [phone]   Fax:  [phone]  Name: Hailey ButteryFelicia G  Hopkins MRN: [id number] Date of Birth: Mar 20, 1976

## 2016-02-23 ENCOUNTER — Encounter: Payer: Self-pay | Admitting: Physical Therapy

## 2016-02-23 ENCOUNTER — Ambulatory Visit: Payer: PRIVATE HEALTH INSURANCE | Admitting: Physical Therapy

## 2016-02-23 DIAGNOSIS — M6281 Muscle weakness (generalized): Secondary | ICD-10-CM

## 2016-02-23 DIAGNOSIS — M25612 Stiffness of left shoulder, not elsewhere classified: Secondary | ICD-10-CM

## 2016-02-23 NOTE — Therapy (Signed)
[facility] [address], [address] [place], [place], [address] Phone: [phone]   Fax:  [phone]  Physical Therapy Treatment  Patient Details  Name: Hailey Hopkins MRN: [id number] Date of Birth: [birth year] Referring Provider: Valma Cavaollins, Andrew, MD  Encounter Date: 02/23/2016      PT End of Session - 02/23/16 1021    Visit Number 64   Number of Visits 65   Date for PT Re-Evaluation 03/20/16   Authorization Type Workers Comp   Authorization - Visit Number 64   Authorization - Number of Visits 65   PT Start Time 1016   PT Stop Time 1100   PT Time Calculation (min) 44 min   Activity Tolerance Patient tolerated treatment well   Behavior During Therapy WFL for tasks assessed/performed      Past Medical History:  Diagnosis Date  . Depression   . Diverticular disease     Past Surgical History:  Procedure Laterality Date  . ABDOMINAL HYSTERECTOMY    . ROTATOR CUFF REPAIR      There were no vitals filed for this visit.      Subjective Assessment - 02/23/16 1019    Subjective Pt reports a little shoulder pain today but not as bad as usual.   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  Lt shoulder surgery 12/14/2014 for partial rotator cuff repair and distal clavicle excision. March 15th injection into left shoulder under Ultrasound to break up scar tissue   Diagnostic tests injection: Cortizone 04/2015   Patient Stated Goals reduce pain, return to normal use of Lt UE   Currently in Pain? Yes   Pain Score 2    Pain Location Shoulder   Pain Orientation Left   Pain Descriptors / Indicators Aching;Sore   Pain Type Chronic pain   Pain Onset More than a month ago   Pain Frequency Constant                         [facility] Adult PT Treatment/Exercise - 02/23/16 0001      Shoulder Exercises: Prone   Flexion Strengthening;Left  3x10   Extension Strengthening;Left  3x10 #2   Horizontal ABduction 1 Strengthening;Left  3x10 #2     Shoulder Exercises: Standing   Internal Rotation AAROM;Both  x10 cane   Flexion AAROM;Both  Stretching x10 cane   ABduction AAROM;Both  x10 cane   Other Standing Exercises Box carry   #15 3 laps around gym     Shoulder Exercises: ROM/Strengthening   UBE (Upper Arm Bike) L1 4x4  review of status     Shoulder Exercises: Power [employer] consultantTower   Other Power Tower Exercises lat bar 20# 3 x 10   Other Power Tower Exercises chest press 3 plates 2 x 10     Shoulder Exercises: Body Blade   Flexion 30 seconds;5 reps  2x5   ABduction 30 seconds  2x5   Other Body Blade Exercises Scaption 10x                  PT Short Term Goals - 01/23/16 1054      PT SHORT TERM GOAL #1   Title She will be independent with inital HEP    Status Achieved     PT SHORT TERM GOAL #2   Title report a 25% reduction in Lt shoulder pain with use at home and work   Status Achieved     PT SHORT TERM GOAL #  3   Title demonstrate Lt shoulder AROM flexion to 145 to improve use with self-care and overhead reaching   Status Achieved           PT Long Term Goals - 02/21/16 1026      PT LONG TERM GOAL #3   Title improve Lt shoulder strength to lift a gallon of milk out of fridge without difficulty   Baseline more painful in the last week   Time 5   Period Weeks   Status On-going     PT LONG TERM GOAL #4   Title demonstrate 4+/5 Lt shoulder strength throughout to improve endurance for Lt UE use   Baseline Pain with endrange left shoulder flexion   Time 8   Period Weeks   Status Achieved     PT LONG TERM GOAL #5   Title able to lift to waist and carry 10 pounds or more to improve independence with home tasks   Baseline able to do in clinic    Time 5   Period Weeks   Status On-going     PT LONG TERM GOAL #6   Title improve Lt shoulder strength to lift object from a top shelf without difficulty or limitation   Time 5   Period Weeks    Status On-going               Plan - 02/23/16 1051    Clinical Impression Statement Pt able to tolerate prone shoulder stability exercises well. Will begin to incorporate more theraputic activities to improve pt ability to perform daily activities. Will continue to progress ahoulder strength and stability.    Rehab Potential Good   Clinical Impairments Affecting Rehab Potential 11/10/15 left arthroscopic SAD/DCR   PT Frequency 2x / week   PT Duration 6 weeks   PT Treatment/Interventions ADLs/Self Care Home Management;Cryotherapy;Electrical Stimulation;Iontophoresis 4mg /ml Dexamethasone;Moist Heat;Therapeutic exercise;Therapeutic activities;Functional mobility training;Ultrasound;Neuromuscular re-education;Patient/family education;Manual techniques;Taping;Dry needling;Passive range of motion;Vasopneumatic Device   PT Next Visit Plan Therapustic activites including boxy carry, cone stacking in/out shelf   Consulted and Agree with Plan of Care Patient      Patient will benefit from skilled therapeutic intervention in order to improve the following deficits and impairments:  Postural dysfunction, Decreased strength, Impaired flexibility, Pain, Impaired UE functional use, Decreased activity tolerance, Decreased range of motion, Decreased endurance  Visit Diagnosis: Muscle weakness (generalized)  Shoulder stiffness, left     Problem List Patient Active Problem List   Diagnosis Date Noted  . Insomnia 10/29/2014  . Plantar fasciitis 12/02/2013  . Benign essential HTN 05/28/2013  . Hepatic steatosis 04/12/2011  . Back pain 06/16/2010  . HEMORRHOIDS, EXTERNAL 12/21/2009  . DEPRESSION, MAJOR, MODERATE 04/04/2007  . HAIR LOSS 08/23/2006  . OBESITY NOS 05/28/2006  . FATIGUE 05/28/2006  . GLUCOSE INTOLERANCE 05/23/2006  . Migraine 05/23/2006    Dessa PhiKatherine Jayde Daffin PTA 02/23/2016, 11:00 AM  [facility] [address], [address] [place], [place], [address] Phone: [phone]   Fax:  [phone]  Name: Hailey Hopkins MRN: [id number] Date of Birth: Apr 12, 1975

## 2016-02-28 ENCOUNTER — Encounter: Payer: Self-pay | Admitting: Physical Therapy

## 2016-02-28 ENCOUNTER — Ambulatory Visit: Payer: PRIVATE HEALTH INSURANCE | Attending: Surgical | Admitting: Physical Therapy

## 2016-02-28 DIAGNOSIS — M25612 Stiffness of left shoulder, not elsewhere classified: Secondary | ICD-10-CM | POA: Insufficient documentation

## 2016-02-28 DIAGNOSIS — M6281 Muscle weakness (generalized): Secondary | ICD-10-CM | POA: Insufficient documentation

## 2016-02-28 NOTE — Therapy (Signed)
[facility] [address], [address] [place], [place], [address] Phone: [phone]   Fax:  [phone]  Physical Therapy Treatment  Patient Details  Name: Hailey Hopkins MRN: [id number] Date of Birth: 14-Oct-1975 Referring Provider: Valma Cavaollins, Andrew, MD  Encounter Date: 02/28/2016      PT End of Session - 02/28/16 1020    Visit Number 65   Number of Visits 65   Date for PT Re-Evaluation 03/20/16   Authorization Type Workers Comp   Authorization - Visit Number 65   Authorization - Number of Visits 65   PT Start Time 1019   PT Stop Time 1059   PT Time Calculation (min) 40 min   Activity Tolerance Patient tolerated treatment well   Behavior During Therapy WFL for tasks assessed/performed      Past Medical History:  Diagnosis Date  . Depression   . Diverticular disease     Past Surgical History:  Procedure Laterality Date  . ABDOMINAL HYSTERECTOMY    . ROTATOR CUFF REPAIR      There were no vitals filed for this visit.      Subjective Assessment - 02/28/16 1020    Subjective Shoulder is ok today.    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  Lt shoulder surgery 12/14/2014 for partial rotator cuff repair and distal clavicle excision. March 15th injection into left shoulder under Ultrasound to break up scar tissue   Diagnostic tests injection: Cortizone 04/2015   Patient Stated Goals reduce pain, return to normal use of Lt UE   Currently in Pain? Yes   Pain Score 2    Pain Location Shoulder   Pain Orientation Left   Pain Descriptors / Indicators Aching   Pain Type Chronic pain   Pain Onset More than a month ago   Pain Frequency Constant                         [facility] Adult PT Treatment/Exercise - 02/28/16 0001      Shoulder Exercises: Standing   Internal Rotation AAROM;Both  x10 cane   Flexion AAROM;Both  Stretching x10 cane   ABduction AAROM;Both  x10 cane   Other  Standing Exercises Box carry   #15 5 laps around gym   Other Standing Exercises Cone stacking in/out shelf   #2 x5     Shoulder Exercises: ROM/Strengthening   UBE (Upper Arm Bike) L1 4x4  review of status     Shoulder Exercises: Isometric Strengthening   Flexion --  wall wash x 1 min    ABduction --  Wall wash x 1 minute     Shoulder Exercises: Power Hex[employer] Specialty Chemicalsower   Row 20 reps   Row Limitations 25#   Other Power Museum/gallery curatorTower Exercises lat bar 20# 3 x 10   Other Power Tower Exercises chest press 3 plates 2 x 10     Shoulder Exercises: Body Blade   Flexion 30 seconds;5 reps  theraball drop catch   ABduction 30 seconds  theraball drop catch                  PT Short Term Goals - 01/23/16 1054      PT SHORT TERM GOAL #1   Title She will be independent with inital HEP    Status Achieved     PT SHORT TERM GOAL #2   Title report a 25% reduction in Lt shoulder pain with use at home and work  Status Achieved     PT SHORT TERM GOAL #3   Title demonstrate Lt shoulder AROM flexion to 145 to improve use with self-care and overhead reaching   Status Achieved           PT Long Term Goals - 02/28/16 1026      PT LONG TERM GOAL #1   Title be independent in advanced HEP   Time 5   Period Weeks   Status On-going     PT LONG TERM GOAL #2   Title improve FOTO to < or = to 46% limitation   Baseline 110 today   Time 8   Period Weeks   Status Achieved     PT LONG TERM GOAL #3   Title improve Lt shoulder strength to lift a gallon of milk out of fridge without difficulty   Baseline more painful in the last week   Time 5   Period Weeks   Status On-going     PT LONG TERM GOAL #4   Title demonstrate 4+/5 Lt shoulder strength throughout to improve endurance for Lt UE use   Baseline Pain with endrange left shoulder flexion   Time 8   Period Weeks   Status Achieved     PT LONG TERM GOAL #5   Title able to lift to waist and carry 10 pounds or more to improve independence  with home tasks   Baseline able to do in clinic    Time 5   Period Weeks   Status On-going     PT LONG TERM GOAL #6   Title improve Lt shoulder strength to lift object from a top shelf without difficulty or limitation   Time 5   Period Weeks   Status On-going               Plan - 02/28/16 1051    Clinical Impression Statement Pt able to tolerate all tehraputic activities well to simulate home activities. Pt able to tolerate all strengtheing well. Pt will continue to benefit from skilled therapy for shoulder stability and strengthening.   Rehab Potential Good   Clinical Impairments Affecting Rehab Potential 11/10/15 left arthroscopic SAD/DCR   PT Frequency 2x / week   PT Duration 6 weeks   PT Treatment/Interventions ADLs/Self Care Home Management;Cryotherapy;Electrical Stimulation;Iontophoresis 4mg /ml Dexamethasone;Moist Heat;Therapeutic exercise;Therapeutic activities;Functional mobility training;Ultrasound;Neuromuscular re-education;Patient/family education;Manual techniques;Taping;Dry needling;Passive range of motion;Vasopneumatic Device   PT Next Visit Plan Therapustic activites including boxy carry, cone stacking in/out shelf   Consulted and Agree with Plan of Care Patient      Patient will benefit from skilled therapeutic intervention in order to improve the following deficits and impairments:  Postural dysfunction, Decreased strength, Impaired flexibility, Pain, Impaired UE functional use, Decreased activity tolerance, Decreased range of motion, Decreased endurance  Visit Diagnosis: Muscle weakness (generalized)  Shoulder stiffness, left     Problem List Patient Active Problem List   Diagnosis Date Noted  . Insomnia 10/29/2014  . Plantar fasciitis 12/02/2013  . Benign essential HTN 05/28/2013  . Hepatic steatosis 04/12/2011  . Back pain 06/16/2010  . HEMORRHOIDS, EXTERNAL 12/21/2009  . DEPRESSION, MAJOR, MODERATE 04/04/2007  . HAIR LOSS 08/23/2006  . OBESITY  NOS 05/28/2006  . FATIGUE 05/28/2006  . GLUCOSE INTOLERANCE 05/23/2006  . Migraine 05/23/2006    Dessa PhiKatherine Matthews PTA 02/28/2016, 10:58 AM  [facility] [address], [address] [place], [place], [address] Phone: [phone]   Fax:  [phone]  Name: Hailey Hopkins  MRN: [id number] Date of Birth: [birth year]

## 2016-03-01 ENCOUNTER — Encounter: Payer: Self-pay | Admitting: Physical Therapy

## 2016-03-06 ENCOUNTER — Encounter: Payer: Self-pay | Admitting: Physical Therapy

## 2016-03-06 ENCOUNTER — Ambulatory Visit: Payer: PRIVATE HEALTH INSURANCE | Admitting: Physical Therapy

## 2016-03-06 DIAGNOSIS — M25612 Stiffness of left shoulder, not elsewhere classified: Secondary | ICD-10-CM

## 2016-03-06 DIAGNOSIS — M6281 Muscle weakness (generalized): Secondary | ICD-10-CM | POA: Diagnosis not present

## 2016-03-06 NOTE — Therapy (Addendum)
[facility] [address] [place], [place], [address] Phone: [phone]   Fax:  [phone]  Physical Therapy Treatment  Patient Details  Name: Hailey Hopkins MRN: [id number] Date of Birth: 1975/07/31 Referring Provider: Hart Robinsons, MD  Encounter Date: 03/06/2016      PT End of Session - 03/06/16 1021    Visit Number 36   Date for PT Re-Evaluation 03/20/16   Authorization Type Workers Comp   Authorization - Visit Number 80   PT Start Time 1016   PT Stop Time 1057   PT Time Calculation (min) 41 min   Activity Tolerance Patient tolerated treatment well   Behavior During Therapy [facility] for tasks assessed/performed      Past Medical History:  Diagnosis Date  . Depression   . Diverticular disease     Past Surgical History:  Procedure Laterality Date  . ABDOMINAL HYSTERECTOMY    . ROTATOR CUFF REPAIR      There were no vitals filed for this visit.      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  Lt shoulder surgery 12/14/2014 for partial rotator cuff repair and distal clavicle excision. March 15th injection into left shoulder under Ultrasound to break up scar tissue   Diagnostic tests injection: Cortizone 04/2015   Patient Stated Goals reduce pain, return to normal use of Lt UE   Currently in Pain? No/denies                         [facility] Adult PT Treatment/Exercise - 03/06/16 0001      Shoulder Exercises: Standing   Internal Rotation AAROM;Both  x10 cane   Flexion AAROM;Both  Stretching x10 cane   ABduction AAROM;Both  x10 cane   Other Standing Exercises Box carry   #15 5 laps around gym   Other Standing Exercises Cone stacking in/out shelf   #2.5 x5     Shoulder Exercises: ROM/Strengthening   UBE (Upper Arm Bike) L2 4x4  review of status      Shoulder Exercises: Isometric Strengthening   Flexion --  wall wash 30 sec x 2 #2.5   ABduction --     Shoulder Exercises: Power [employer] 20 reps   Row Limitations 30   Other Power Tower Exercises lat bar 20# 3 x 10   Other Power Tower Exercises chest press 3 plates 2 x 10     Shoulder Exercises: Body Blade   Flexion 30 seconds;5 reps  theraball drop catch   ABduction 30 seconds  theraball drop catch   Other Body Blade Exercises Scaption 10x                  PT Short Term Goals - 01/23/16 1054      PT SHORT TERM GOAL #1   Title She will be independent with inital HEP    Status Achieved     PT SHORT TERM GOAL #2   Title report a 25% reduction in Lt shoulder pain with use at home and work   Status Achieved     PT SHORT TERM GOAL #3   Title demonstrate Lt shoulder AROM flexion to 145 to improve use with self-care and overhead reaching   Status Achieved           PT Long Term Goals - 03/06/16  [place] #1   Title be independent in advanced HEP   Time 5   Period Weeks   Status On-going     PT LONG TERM GOAL #3   Title improve Lt shoulder strength to lift a gallon of milk out of fridge without difficulty   Baseline more painful in the last week   Time 5   Period Weeks   Status On-going     PT LONG TERM GOAL #5   Title able to lift to waist and carry 10 pounds or more to improve independence with home tasks   Time 5   Period Weeks   Status Achieved  With both hands     PT LONG TERM GOAL #6   Title improve Lt shoulder strength to lift object from a top shelf without difficulty or limitation   Time 5   Period Weeks   Status On-going  Getting easier               Plan - 03/06/16 1055    Clinical Impression Statement Pt continues to progress with shoulder strength and stability with functional tasks. Pt continues to have some shoulder fatgue with activities but performs well over all. Pt will continue to benefit from  skilled therapy for shoulder strength and progress towards long term goals.    Rehab Potential Good   Clinical Impairments Affecting Rehab Potential 11/10/15 left arthroscopic SAD/DCR   PT Frequency 2x / week   PT Duration 6 weeks   PT Treatment/Interventions ADLs/Self Care Home Management;Cryotherapy;Electrical Stimulation;Iontophoresis 26m/ml Dexamethasone;Moist Heat;Therapeutic exercise;Therapeutic activities;Functional mobility training;Ultrasound;Neuromuscular re-education;Patient/family education;Manual techniques;Taping;Dry needling;Passive range of motion;Vasopneumatic Device   PT Next Visit Plan Therapustic activites including boxy carry, cone stacking in/out shelf   Consulted and Agree with Plan of Care Patient      Patient will benefit from skilled therapeutic intervention in order to improve the following deficits and impairments:  Postural dysfunction, Decreased strength, Impaired flexibility, Pain, Impaired UE functional use, Decreased activity tolerance, Decreased range of motion, Decreased endurance  Visit Diagnosis: Muscle weakness (generalized)  Shoulder stiffness, left     Problem List Patient Active Problem List   Diagnosis Date Noted  . Insomnia 10/29/2014  . Plantar fasciitis 12/02/2013  . Benign essential HTN 05/28/2013  . Hepatic steatosis 04/12/2011  . Back pain 06/16/2010  . HEMORRHOIDS, EXTERNAL 12/21/2009  . DEPRESSION, MAJOR, MODERATE 04/04/2007  . HAIR LOSS 08/23/2006  . OBESITY NOS 05/28/2006  . FATIGUE 05/28/2006  . GLUCOSE INTOLERANCE 05/23/2006  . Migraine 05/23/2006    KMikle BosworthPTA 03/06/2016, 10:58 AM PHYSICAL THERAPY DISCHARGE SUMMARY  Visits from Start of Care: 66  Current functional level related to goals / functional outcomes: See above for most current status.  Pt with functional weakness in the Lt UE and has HEP in place for further gains.     Remaining deficits: See above for current status.     Education /  Equipment: HEP Plan: Patient agrees to discharge.  Patient goals were met. Patient is being discharged due to meeting the stated rehab goals.  ?????  KSigurd Sos PT 03/13/16 7:41 AM       [facility] [address] [place] [address]Phone: [phone]  Fax:  [phone] Name: FMEGON KALINAMRN: [id number] of Birth: [birth year]

## 2016-03-08 ENCOUNTER — Encounter: Payer: Self-pay | Admitting: Physical Therapy

## 2016-03-13 ENCOUNTER — Encounter: Payer: Self-pay | Admitting: Physical Therapy

## 2016-04-19 DIAGNOSIS — K210 Gastro-esophageal reflux disease with esophagitis: Secondary | ICD-10-CM | POA: Diagnosis not present

## 2016-04-19 DIAGNOSIS — R350 Frequency of micturition: Secondary | ICD-10-CM | POA: Diagnosis not present

## 2016-05-15 DIAGNOSIS — L309 Dermatitis, unspecified: Secondary | ICD-10-CM | POA: Diagnosis not present

## 2016-06-11 DIAGNOSIS — L509 Urticaria, unspecified: Secondary | ICD-10-CM | POA: Diagnosis not present

## 2016-07-19 ENCOUNTER — Other Ambulatory Visit: Payer: Self-pay | Admitting: Family Medicine

## 2016-07-19 DIAGNOSIS — Z1231 Encounter for screening mammogram for malignant neoplasm of breast: Secondary | ICD-10-CM

## 2016-07-30 DIAGNOSIS — K219 Gastro-esophageal reflux disease without esophagitis: Secondary | ICD-10-CM | POA: Diagnosis not present

## 2016-07-30 DIAGNOSIS — Z13220 Encounter for screening for lipoid disorders: Secondary | ICD-10-CM | POA: Diagnosis not present

## 2016-07-30 DIAGNOSIS — R5383 Other fatigue: Secondary | ICD-10-CM | POA: Diagnosis not present

## 2016-07-30 DIAGNOSIS — M25551 Pain in right hip: Secondary | ICD-10-CM | POA: Diagnosis not present

## 2016-07-30 DIAGNOSIS — Z0000 Encounter for general adult medical examination without abnormal findings: Secondary | ICD-10-CM | POA: Diagnosis not present

## 2016-07-30 DIAGNOSIS — Z23 Encounter for immunization: Secondary | ICD-10-CM | POA: Diagnosis not present

## 2016-07-31 ENCOUNTER — Other Ambulatory Visit: Payer: Self-pay | Admitting: Family Medicine

## 2016-07-31 DIAGNOSIS — N644 Mastodynia: Secondary | ICD-10-CM

## 2016-08-24 DIAGNOSIS — J069 Acute upper respiratory infection, unspecified: Secondary | ICD-10-CM | POA: Diagnosis not present

## 2016-08-28 ENCOUNTER — Ambulatory Visit
Admission: RE | Admit: 2016-08-28 | Discharge: 2016-08-28 | Disposition: A | Discharge status: Home / Self Care | Payer: BLUE CROSS/BLUE SHIELD | Source: Ambulatory Visit | Attending: Family Medicine | Admitting: Family Medicine

## 2016-08-28 DIAGNOSIS — R928 Other abnormal and inconclusive findings on diagnostic imaging of breast: Secondary | ICD-10-CM | POA: Diagnosis not present

## 2016-08-28 DIAGNOSIS — N644 Mastodynia: Secondary | ICD-10-CM

## 2016-08-28 DIAGNOSIS — N6489 Other specified disorders of breast: Secondary | ICD-10-CM | POA: Diagnosis not present

## 2016-10-18 IMAGING — DX DG SHOULDER 2+V*L*
3 series · 3 of 3 positions shown · non-contrast
Comparison: None.

CLINICAL DATA: Left shoulder pain following lifting injury with
decreased range of motion, initial encounter

EXAM:
LEFT SHOULDER - 2+ VIEW

[shoulder grashey]
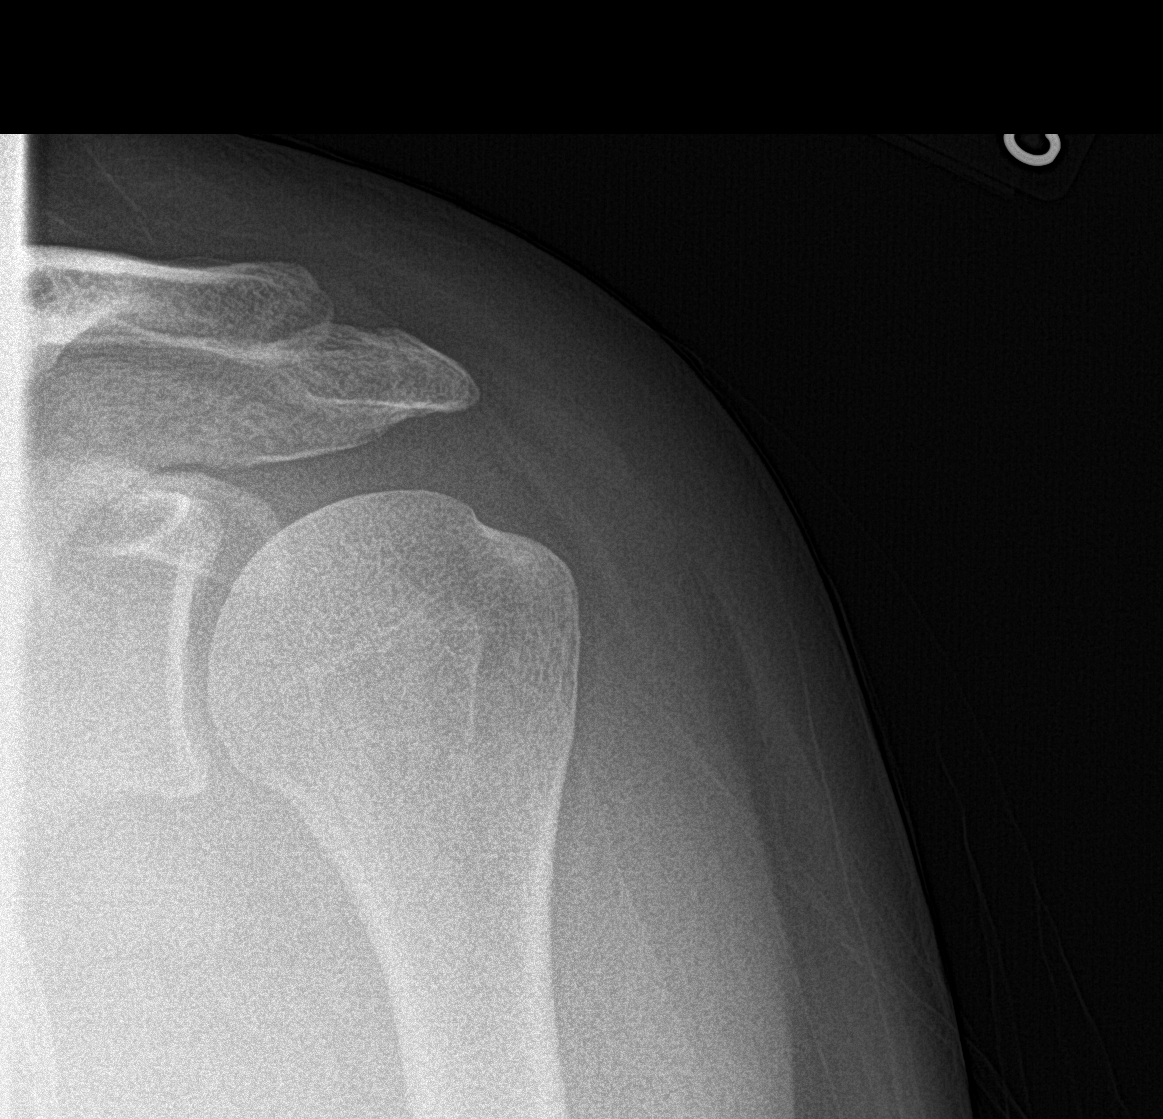

[shoulder y view]
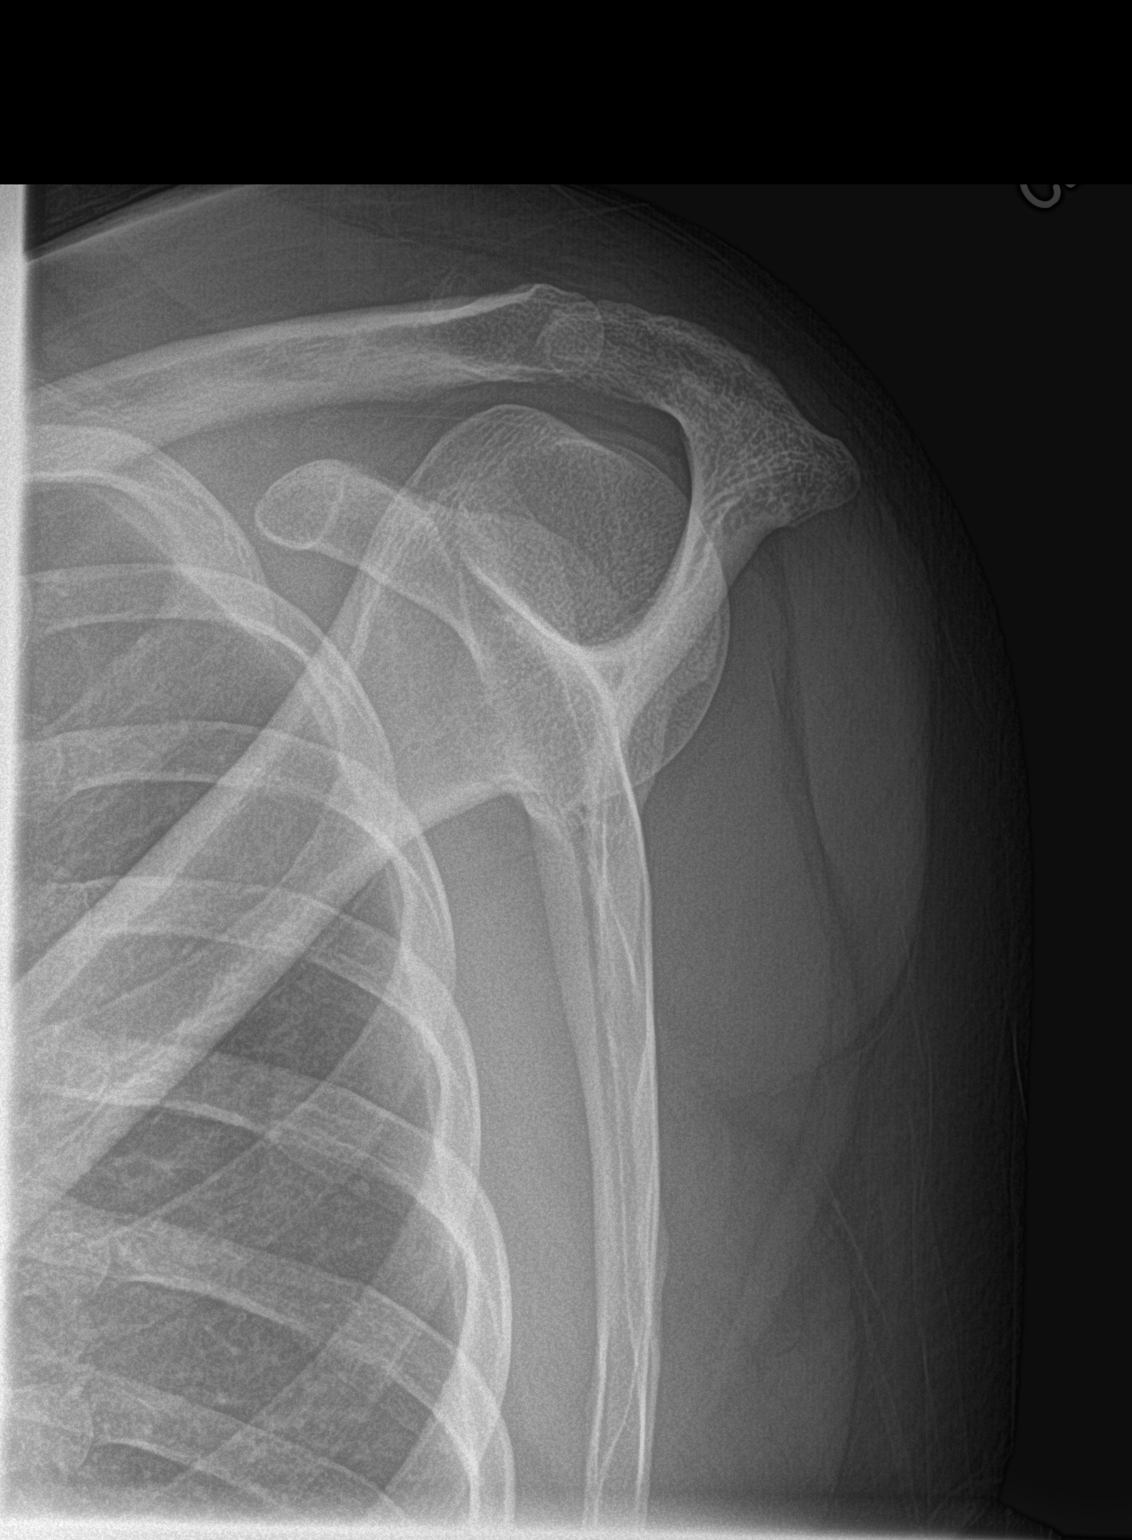

[shoulder axillary]
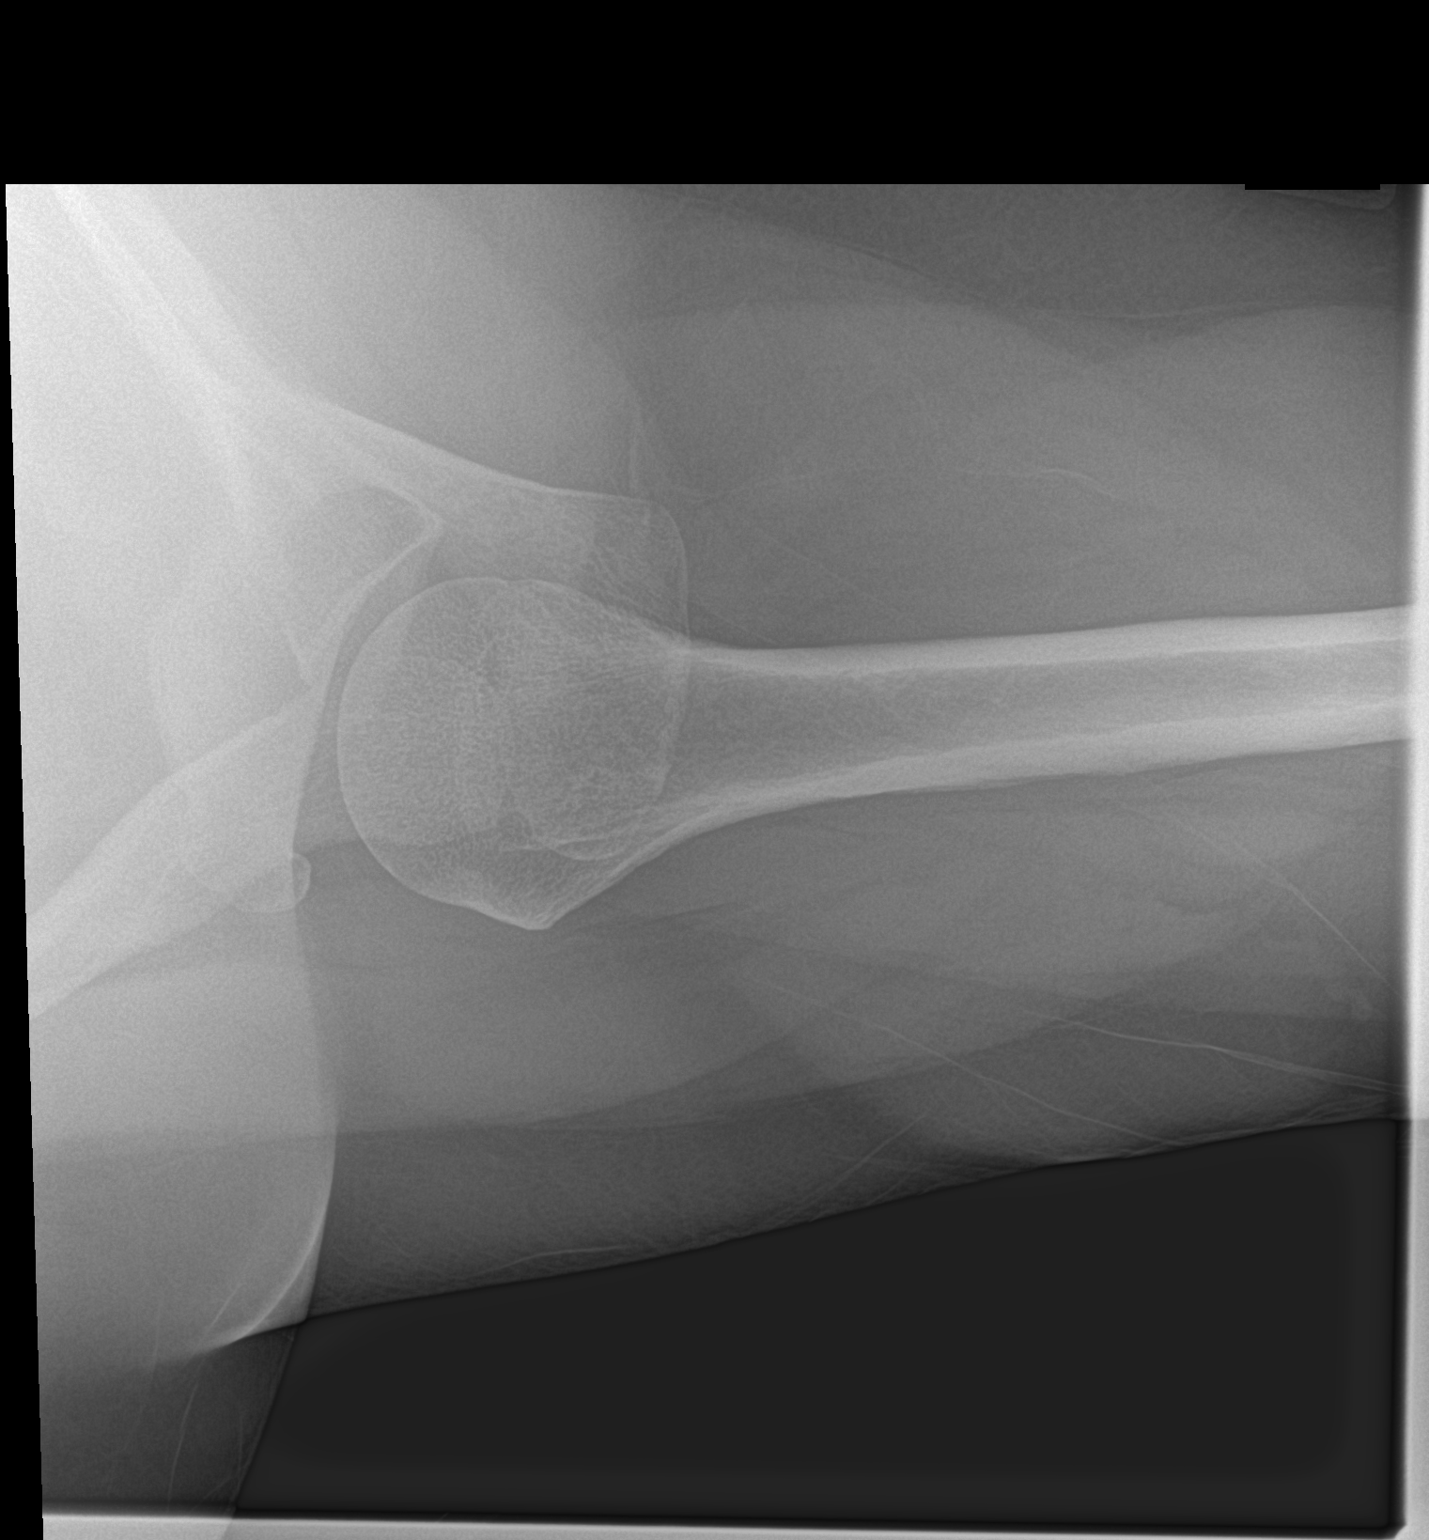

[3 of 3 positions shown; findings below may reference images not displayed]

FINDINGS: There is no evidence of fracture or dislocation. There is no
evidence of arthropathy or other focal bone abnormality. Soft
tissues are unremarkable.
IMPRESSION: No acute abnormality noted.

## 2016-11-22 DIAGNOSIS — Z23 Encounter for immunization: Secondary | ICD-10-CM | POA: Diagnosis not present

## 2016-11-22 DIAGNOSIS — G4700 Insomnia, unspecified: Secondary | ICD-10-CM | POA: Diagnosis not present

## 2017-01-31 DIAGNOSIS — K625 Hemorrhage of anus and rectum: Secondary | ICD-10-CM | POA: Diagnosis not present

## 2017-01-31 DIAGNOSIS — I10 Essential (primary) hypertension: Secondary | ICD-10-CM | POA: Diagnosis not present

## 2017-02-21 DIAGNOSIS — J069 Acute upper respiratory infection, unspecified: Secondary | ICD-10-CM | POA: Diagnosis not present

## 2017-03-29 DIAGNOSIS — K219 Gastro-esophageal reflux disease without esophagitis: Secondary | ICD-10-CM | POA: Diagnosis not present

## 2017-03-29 DIAGNOSIS — M25551 Pain in right hip: Secondary | ICD-10-CM | POA: Diagnosis not present

## 2017-03-29 DIAGNOSIS — F418 Other specified anxiety disorders: Secondary | ICD-10-CM | POA: Diagnosis not present

## 2017-04-08 DIAGNOSIS — M5416 Radiculopathy, lumbar region: Secondary | ICD-10-CM | POA: Diagnosis not present

## 2017-04-08 DIAGNOSIS — M7061 Trochanteric bursitis, right hip: Secondary | ICD-10-CM | POA: Diagnosis not present

## 2017-04-12 DIAGNOSIS — M5416 Radiculopathy, lumbar region: Secondary | ICD-10-CM | POA: Diagnosis not present

## 2017-04-22 DIAGNOSIS — M5410 Radiculopathy, site unspecified: Secondary | ICD-10-CM | POA: Diagnosis not present

## 2017-04-22 DIAGNOSIS — M545 Low back pain: Secondary | ICD-10-CM | POA: Diagnosis not present

## 2017-05-02 DIAGNOSIS — M545 Low back pain: Secondary | ICD-10-CM | POA: Diagnosis not present

## 2017-07-18 DIAGNOSIS — M5136 Other intervertebral disc degeneration, lumbar region: Secondary | ICD-10-CM | POA: Diagnosis not present

## 2017-07-18 DIAGNOSIS — M545 Low back pain: Secondary | ICD-10-CM | POA: Diagnosis not present

## 2017-08-22 DIAGNOSIS — G4700 Insomnia, unspecified: Secondary | ICD-10-CM | POA: Diagnosis not present

## 2017-10-09 DIAGNOSIS — R030 Elevated blood-pressure reading, without diagnosis of hypertension: Secondary | ICD-10-CM | POA: Diagnosis not present

## 2017-10-09 DIAGNOSIS — R0683 Snoring: Secondary | ICD-10-CM | POA: Diagnosis not present

## 2017-10-09 DIAGNOSIS — R0681 Apnea, not elsewhere classified: Secondary | ICD-10-CM | POA: Diagnosis not present

## 2017-10-09 DIAGNOSIS — G4719 Other hypersomnia: Secondary | ICD-10-CM | POA: Diagnosis not present

## 2017-10-30 DIAGNOSIS — G4733 Obstructive sleep apnea (adult) (pediatric): Secondary | ICD-10-CM | POA: Diagnosis not present

## 2017-11-07 DIAGNOSIS — M545 Low back pain: Secondary | ICD-10-CM | POA: Diagnosis not present

## 2017-11-14 DIAGNOSIS — M545 Low back pain: Secondary | ICD-10-CM | POA: Diagnosis not present

## 2017-11-14 DIAGNOSIS — M5136 Other intervertebral disc degeneration, lumbar region: Secondary | ICD-10-CM | POA: Diagnosis not present

## 2017-11-14 DIAGNOSIS — M48061 Spinal stenosis, lumbar region without neurogenic claudication: Secondary | ICD-10-CM | POA: Diagnosis not present

## 2017-11-14 DIAGNOSIS — Z6841 Body Mass Index (BMI) 40.0 and over, adult: Secondary | ICD-10-CM | POA: Diagnosis not present

## 2017-11-15 DIAGNOSIS — G4733 Obstructive sleep apnea (adult) (pediatric): Secondary | ICD-10-CM | POA: Diagnosis not present

## 2017-12-03 DIAGNOSIS — M5416 Radiculopathy, lumbar region: Secondary | ICD-10-CM | POA: Diagnosis not present

## 2017-12-03 DIAGNOSIS — M5136 Other intervertebral disc degeneration, lumbar region: Secondary | ICD-10-CM | POA: Diagnosis not present

## 2017-12-16 DIAGNOSIS — G4733 Obstructive sleep apnea (adult) (pediatric): Secondary | ICD-10-CM | POA: Diagnosis not present

## 2018-01-06 ENCOUNTER — Other Ambulatory Visit: Payer: Self-pay | Admitting: Family Medicine

## 2018-01-06 DIAGNOSIS — Z1231 Encounter for screening mammogram for malignant neoplasm of breast: Secondary | ICD-10-CM

## 2018-01-14 DIAGNOSIS — G4733 Obstructive sleep apnea (adult) (pediatric): Secondary | ICD-10-CM | POA: Diagnosis not present

## 2018-01-14 DIAGNOSIS — M25551 Pain in right hip: Secondary | ICD-10-CM | POA: Diagnosis not present

## 2018-01-14 DIAGNOSIS — M5136 Other intervertebral disc degeneration, lumbar region: Secondary | ICD-10-CM | POA: Diagnosis not present

## 2018-01-14 DIAGNOSIS — M5416 Radiculopathy, lumbar region: Secondary | ICD-10-CM | POA: Diagnosis not present

## 2018-01-14 DIAGNOSIS — M545 Low back pain: Secondary | ICD-10-CM | POA: Diagnosis not present

## 2018-01-15 DIAGNOSIS — G4733 Obstructive sleep apnea (adult) (pediatric): Secondary | ICD-10-CM | POA: Diagnosis not present

## 2018-02-04 DIAGNOSIS — M5416 Radiculopathy, lumbar region: Secondary | ICD-10-CM | POA: Diagnosis not present

## 2018-02-05 DIAGNOSIS — I10 Essential (primary) hypertension: Secondary | ICD-10-CM | POA: Diagnosis not present

## 2018-02-05 DIAGNOSIS — R631 Polydipsia: Secondary | ICD-10-CM | POA: Diagnosis not present

## 2018-02-05 DIAGNOSIS — G4733 Obstructive sleep apnea (adult) (pediatric): Secondary | ICD-10-CM | POA: Diagnosis not present

## 2018-02-05 DIAGNOSIS — Z23 Encounter for immunization: Secondary | ICD-10-CM | POA: Diagnosis not present

## 2018-02-05 DIAGNOSIS — R739 Hyperglycemia, unspecified: Secondary | ICD-10-CM | POA: Diagnosis not present

## 2018-02-05 DIAGNOSIS — R030 Elevated blood-pressure reading, without diagnosis of hypertension: Secondary | ICD-10-CM | POA: Diagnosis not present

## 2018-02-12 ENCOUNTER — Ambulatory Visit: Payer: Self-pay

## 2018-02-12 ENCOUNTER — Ambulatory Visit
Admission: RE | Admit: 2018-02-12 | Discharge: 2018-02-12 | Disposition: A | Discharge status: Home / Self Care | Payer: BLUE CROSS/BLUE SHIELD | Source: Ambulatory Visit | Attending: Family Medicine | Admitting: Family Medicine

## 2018-02-12 DIAGNOSIS — Z1231 Encounter for screening mammogram for malignant neoplasm of breast: Secondary | ICD-10-CM

## 2018-02-15 DIAGNOSIS — G4733 Obstructive sleep apnea (adult) (pediatric): Secondary | ICD-10-CM | POA: Diagnosis not present

## 2018-02-28 DIAGNOSIS — I10 Essential (primary) hypertension: Secondary | ICD-10-CM | POA: Diagnosis not present

## 2018-02-28 DIAGNOSIS — R739 Hyperglycemia, unspecified: Secondary | ICD-10-CM | POA: Diagnosis not present

## 2018-03-05 DIAGNOSIS — R32 Unspecified urinary incontinence: Secondary | ICD-10-CM | POA: Diagnosis not present

## 2018-03-05 DIAGNOSIS — I10 Essential (primary) hypertension: Secondary | ICD-10-CM | POA: Diagnosis not present

## 2018-03-05 DIAGNOSIS — Z0000 Encounter for general adult medical examination without abnormal findings: Secondary | ICD-10-CM | POA: Diagnosis not present

## 2018-03-05 DIAGNOSIS — R739 Hyperglycemia, unspecified: Secondary | ICD-10-CM | POA: Diagnosis not present

## 2018-03-17 DIAGNOSIS — G4733 Obstructive sleep apnea (adult) (pediatric): Secondary | ICD-10-CM | POA: Diagnosis not present

## 2018-03-25 DIAGNOSIS — I10 Essential (primary) hypertension: Secondary | ICD-10-CM | POA: Diagnosis not present

## 2018-03-25 DIAGNOSIS — M545 Low back pain: Secondary | ICD-10-CM | POA: Diagnosis not present

## 2018-04-01 DIAGNOSIS — M5136 Other intervertebral disc degeneration, lumbar region: Secondary | ICD-10-CM | POA: Diagnosis not present

## 2018-04-17 DIAGNOSIS — G4733 Obstructive sleep apnea (adult) (pediatric): Secondary | ICD-10-CM | POA: Diagnosis not present

## 2018-04-17 DIAGNOSIS — E669 Obesity, unspecified: Secondary | ICD-10-CM | POA: Diagnosis not present

## 2018-04-17 DIAGNOSIS — E1129 Type 2 diabetes mellitus with other diabetic kidney complication: Secondary | ICD-10-CM | POA: Diagnosis not present

## 2018-04-17 DIAGNOSIS — I129 Hypertensive chronic kidney disease with stage 1 through stage 4 chronic kidney disease, or unspecified chronic kidney disease: Secondary | ICD-10-CM | POA: Diagnosis not present

## 2018-04-17 DIAGNOSIS — N179 Acute kidney failure, unspecified: Secondary | ICD-10-CM | POA: Diagnosis not present

## 2018-05-01 DIAGNOSIS — G4733 Obstructive sleep apnea (adult) (pediatric): Secondary | ICD-10-CM | POA: Diagnosis not present

## 2018-05-02 DIAGNOSIS — M5416 Radiculopathy, lumbar region: Secondary | ICD-10-CM | POA: Diagnosis not present

## 2018-05-02 DIAGNOSIS — M25551 Pain in right hip: Secondary | ICD-10-CM | POA: Diagnosis not present

## 2018-05-02 DIAGNOSIS — M545 Low back pain: Secondary | ICD-10-CM | POA: Diagnosis not present

## 2018-05-02 DIAGNOSIS — M5410 Radiculopathy, site unspecified: Secondary | ICD-10-CM | POA: Diagnosis not present

## 2018-05-05 DIAGNOSIS — N179 Acute kidney failure, unspecified: Secondary | ICD-10-CM | POA: Diagnosis not present

## 2018-05-19 DIAGNOSIS — G4733 Obstructive sleep apnea (adult) (pediatric): Secondary | ICD-10-CM | POA: Diagnosis not present

## 2018-06-16 DIAGNOSIS — G4733 Obstructive sleep apnea (adult) (pediatric): Secondary | ICD-10-CM | POA: Diagnosis not present

## 2018-07-16 DIAGNOSIS — I10 Essential (primary) hypertension: Secondary | ICD-10-CM | POA: Diagnosis not present

## 2018-07-16 DIAGNOSIS — E119 Type 2 diabetes mellitus without complications: Secondary | ICD-10-CM | POA: Diagnosis not present

## 2018-07-16 DIAGNOSIS — F418 Other specified anxiety disorders: Secondary | ICD-10-CM | POA: Diagnosis not present

## 2018-07-17 DIAGNOSIS — G4733 Obstructive sleep apnea (adult) (pediatric): Secondary | ICD-10-CM | POA: Diagnosis not present

## 2018-08-16 DIAGNOSIS — G4733 Obstructive sleep apnea (adult) (pediatric): Secondary | ICD-10-CM | POA: Diagnosis not present

## 2018-08-21 DIAGNOSIS — I10 Essential (primary) hypertension: Secondary | ICD-10-CM | POA: Diagnosis not present

## 2018-08-21 DIAGNOSIS — E119 Type 2 diabetes mellitus without complications: Secondary | ICD-10-CM | POA: Diagnosis not present

## 2018-09-16 DIAGNOSIS — G4733 Obstructive sleep apnea (adult) (pediatric): Secondary | ICD-10-CM | POA: Diagnosis not present

## 2018-09-29 DIAGNOSIS — K648 Other hemorrhoids: Secondary | ICD-10-CM | POA: Diagnosis not present

## 2018-10-24 ENCOUNTER — Ambulatory Visit: Payer: Self-pay | Admitting: Surgery

## 2018-10-24 DIAGNOSIS — K649 Unspecified hemorrhoids: Secondary | ICD-10-CM | POA: Diagnosis not present

## 2018-10-24 NOTE — H&P (Signed)
Surgical H&P  CC: hemorrhoids  HPI: This is a very nice 43 year old woman who has been dealing with hemorrhoids for over a year at this point. She describes pain and pruritus, minimal intermittent bleeding. She states she has a history of diverticulosis which was diagnosed by colonoscopy when she was in her early 5s, and has been on fiber supplement/high fiber diet and drinks plenty of water. She reports that she usually has 2-3 soft/loose bowel movement today. Denies pushing or straining. States that she sits on the toilet for less than 10 minutes at a time. Endorses gentle perianal hygiene using toilet paper and wet wipes, occasional sitz bath.  Allergies  Allergen Reactions  . Penicillins Anaphylaxis    Has patient had a PCN reaction causing immediate rash, facial/tongue/throat swelling, SOB or lightheadedness with hypotension: yes Has patient had a PCN reaction causing severe rash involving mucus membranes or skin necrosis: unknown Has patient had a PCN reaction that required hospitalization : yes ED visit Has patient had a PCN reaction occurring within the last 10 years: no If all of the above answers are "NO", then may proceed with Cephalosporin use.     Past Medical History:  Diagnosis Date  . Depression   . Diverticular disease     Past Surgical History:  Procedure Laterality Date  . ABDOMINAL HYSTERECTOMY    . ROTATOR CUFF REPAIR      No family history on file.  Social History   Socioeconomic History  . Marital status: Single    Spouse name: Not on file  . Number of children: Not on file  . Years of education: Not on file  . Highest education level: Not on file  Occupational History  . Occupation: Marine scientist: [employer]  . Occupation: full time Lexicographer: [employer]  . Financial resource strain: Not on file  . Food insecurity    Worry: Not on file    Inability: Not on file  . Transportation needs   Medical: Not on file    Non-medical: Not on file  Tobacco Use  . Smoking status: Never Smoker  . Smokeless tobacco: Never Used  Substance and Sexual Activity  . Alcohol use: Yes    Comment: Once a month  . Drug use: No  . Sexual activity: Not on file  Lifestyle  . Physical activity    Days per week: Not on file    Minutes per session: Not on file  . Stress: Not on file  Relationships  . Social Herbalist on phone: Not on file    Gets together: Not on file    Attends religious service: Not on file    Active member of club or organization: Not on file    Attends meetings of clubs or organizations: Not on file    Relationship status: Not on file  Other Topics Concern  . Not on file  Social History Narrative  . Not on file    Current Outpatient Medications on File Prior to Visit  Medication Sig Dispense Refill  . acetaminophen (TYLENOL) 500 MG tablet Take 1,000 mg by mouth every 6 (six) hours as needed. For pain.    Marland Kitchen buPROPion (WELLBUTRIN XL) 300 MG 24 hr tablet Take 1 tablet (300 mg total) by mouth every morning. 30 tablet 6  . hydrochlorothiazide (MICROZIDE) 12.5 MG capsule Take 1 capsule (12.5 mg total) by mouth daily. 30 capsule 5  .  HYDROcodone-acetaminophen (NORCO/VICODIN) 5-325 MG tablet Take 2 tablets by mouth every 6 (six) hours as needed. 10 tablet 0  . loratadine (CLARITIN) 10 MG tablet Take 1 tablet (10 mg total) by mouth daily. (Patient taking differently: Take 10 mg by mouth daily as needed for allergies. ) 30 tablet 1  . meloxicam (MOBIC) 7.5 MG tablet Take 7.5 mg by mouth daily.    . methocarbamol (ROBAXIN) 500 MG tablet Take 500 mg by mouth 2 (two) times daily as needed. Muscle spasms  0  . naproxen (NAPROSYN) 500 MG tablet Take 500 mg by mouth 2 (two) times daily as needed for moderate pain.    . tamsulosin (FLOMAX) 0.4 MG CAPS capsule Take 1 capsule (0.4 mg total) by mouth daily. 15 capsule 0  . topiramate (TOPAMAX) 100 MG tablet Take 1 tablet (100 mg  total) by mouth daily. 30 tablet 5  . zolpidem (AMBIEN) 10 MG tablet Take 10 mg by mouth at bedtime as needed for sleep.    Marland Kitchen. zolpidem (AMBIEN) 5 MG tablet Take 1 tablet (5 mg total) by mouth at bedtime as needed for sleep. 10 tablet 1   No current facility-administered medications on file prior to visit.    Review of Systems  General Not Present- Appetite Loss, Chills, Fatigue, Fever, Night Sweats, Weight Gain and Weight Loss. Skin Not Present- Change in Wart/Mole, Dryness, Hives, Jaundice, New Lesions, Non-Healing Wounds, Rash and Ulcer. HEENT Present- Wears glasses/contact lenses. Not Present- Earache, Hearing Loss, Hoarseness, Nose Bleed, Oral Ulcers, Ringing in the Ears, Seasonal Allergies, Sinus Pain, Sore Throat, Visual Disturbances and Yellow Eyes. Respiratory Present- Snoring. Not Present- Bloody sputum, Chronic Cough, Difficulty Breathing and Wheezing. Gastrointestinal Present- Hemorrhoids. Not Present- Abdominal Pain, Bloating, Bloody Stool, Change in Bowel Habits, Chronic diarrhea, Constipation, Difficulty Swallowing, Excessive gas, Gets full quickly at meals, Indigestion, Nausea, Rectal Pain and Vomiting. Female Genitourinary Not Present- Frequency, Nocturia, Painful Urination, Pelvic Pain and Urgency. Musculoskeletal Not Present- Back Pain, Joint Pain, Joint Stiffness, Muscle Pain, Muscle Weakness and Swelling of Extremities. Neurological Not Present- Decreased Memory, Fainting, Headaches, Numbness, Seizures, Tingling, Tremor, Trouble walking and Weakness. Psychiatric Present- Depression. Not Present- Anxiety, Bipolar, Change in Sleep Pattern, Fearful and Frequent crying. Endocrine Present- New Diabetes. Not Present- Cold Intolerance, Excessive Hunger, Hair Changes, Heat Intolerance and Hot flashes. Hematology Not Present- Blood Thinners, Easy Bruising, Excessive bleeding, Gland problems, HIV and Persistent Infections.  Vitals (Armen Ferguson CMA; 10/24/2018 10:26 AM) 10/24/2018 10:25  AM Weight: 250.5 lb Height: 66in Body Surface Area: 2.2 m Body Mass Index: 40.43 kg/m  Temp.: 98.19F  Pulse: 115 (Regular)  P.OX: 96% (Room air) BP: 150/98 (Sitting, Left Arm, Standard)  Gen: alert and well appearing Eye: extraocular motion intact, no scleral icterus ENT: moist mucus membranes, dentition intact Neck: no mass or thyromegaly Chest: unlabored respirations, symmetrical air entry, clear bilaterally CV: regular rate and rhythm, no pedal edema Abdomen: soft, nontender, nondistended. 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 No visible fissure, no perianal tenderness, fluctuance or skin changes.   CBC Latest Ref Rng & Units 04/24/2015 10/08/2013 04/04/2012  WBC 4.0 - 10.5 K/uL 7.1 6.1 6.1  Hemoglobin 12.0 - 15.0 g/dL 82.914.5 56.214.3 13.013.9  Hematocrit 36.0 - 46.0 % 41.4  39.9 39.7  Platelets 150 - 400 K/uL 268 276 253    CMP Latest Ref Rng & Units 04/24/2015 10/08/2013 04/04/2012  Glucose 65 - 99 mg/dL 409(W130(H) 119(J101(H) 98  BUN 6 - 20 mg/dL 11 11 8   Creatinine 0.44 - 1.00 mg/dL 4.78(G1.03(H) 9.560.80 2.130.91  Sodium 135 - 145 mmol/L 138 137 134(L)  Potassium 3.5 - 5.1 mmol/L 3.7 3.8 3.5  Chloride 101 - 111 mmol/L 108 105 101  CO2 22 - 32 mmol/L 21(L) 24 23  Calcium 8.9 - 10.3 mg/dL 9.0 9.2 9.0  Total Protein 6.5 - 8.1 g/dL 7.6 6.9 7.4  Total Bilirubin 0.3 - 1.2 mg/dL 0.7 0.6 0.5  Alkaline Phos 38 - 126 U/L 58 59 80  AST 15 - 41 U/L 25 22 17   ALT 14 - 54 U/L 21 24 14     No results found for: INR, PROTIME  Imaging: No results found.   A/P:HEMORRHOIDS (K64.9) Story: She endorses compliance with all the appropriate lifestyle measures and toileting habits to  minimize hemorrhage symptoms but continues to have issues. Interested in surgical options. We discussed excisional hemorrhoidectomy, risks of bleeding, infection, pain, scarring, urinary retention, sphincter injury and incontinence, hemorrhoid recurrence. Questions answered. She wishes to proceed.   Phylliss Blakeshelsea Weslyn Holsonback, MD [facility], [place] Pager [phone]

## 2018-10-28 DIAGNOSIS — E119 Type 2 diabetes mellitus without complications: Secondary | ICD-10-CM | POA: Diagnosis not present

## 2018-10-28 DIAGNOSIS — F418 Other specified anxiety disorders: Secondary | ICD-10-CM | POA: Diagnosis not present

## 2018-10-28 DIAGNOSIS — I10 Essential (primary) hypertension: Secondary | ICD-10-CM | POA: Diagnosis not present

## 2019-01-06 DIAGNOSIS — R21 Rash and other nonspecific skin eruption: Secondary | ICD-10-CM | POA: Diagnosis not present

## 2019-01-06 DIAGNOSIS — Z23 Encounter for immunization: Secondary | ICD-10-CM | POA: Diagnosis not present

## 2019-01-21 DIAGNOSIS — I10 Essential (primary) hypertension: Secondary | ICD-10-CM | POA: Diagnosis not present

## 2019-01-21 DIAGNOSIS — K760 Fatty (change of) liver, not elsewhere classified: Secondary | ICD-10-CM | POA: Diagnosis not present

## 2019-01-21 DIAGNOSIS — E669 Obesity, unspecified: Secondary | ICD-10-CM | POA: Diagnosis not present

## 2019-01-21 DIAGNOSIS — G4733 Obstructive sleep apnea (adult) (pediatric): Secondary | ICD-10-CM | POA: Diagnosis not present

## 2019-01-30 IMAGING — MG 2D DIGITAL DIAGNOSTIC BILATERAL MAMMOGRAM WITH CAD AND ADJUNCT T
8 of 15 series · 8 of 35 positions shown · non-contrast
Comparison: None.

ACR Breast Density Category a: The breast tissue is almost entirely
fatty.

CLINICAL DATA: 41-year-old female with a focal area of pain in the
inner left breast.

EXAM:
2D DIGITAL DIAGNOSTIC BILATERAL MAMMOGRAM WITH CAD AND ADJUNCT TOMO
LEFT BREAST ULTRASOUND

[L MLO synth-2D]
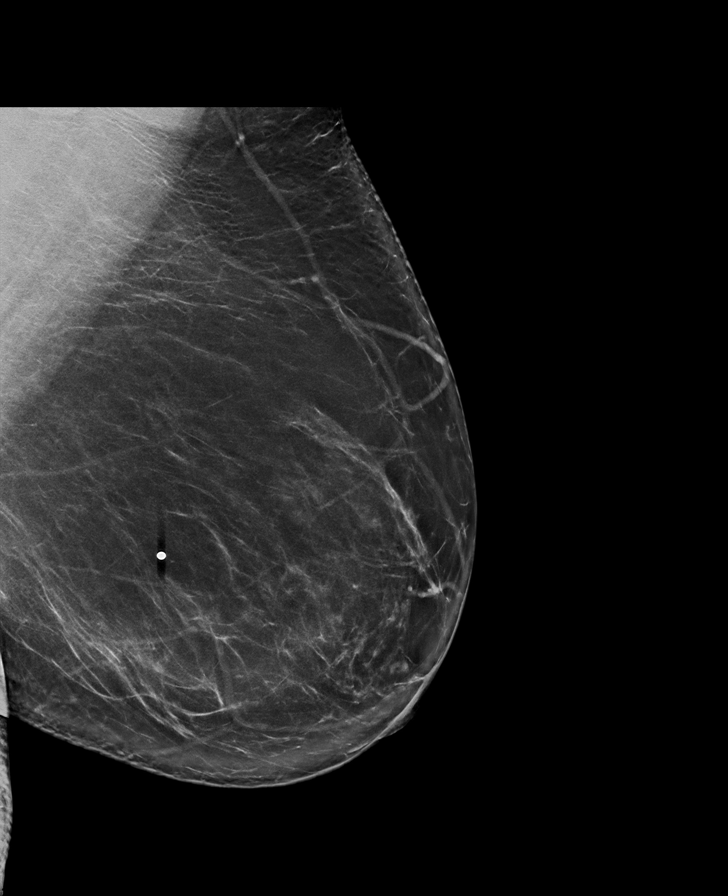

[L CC]
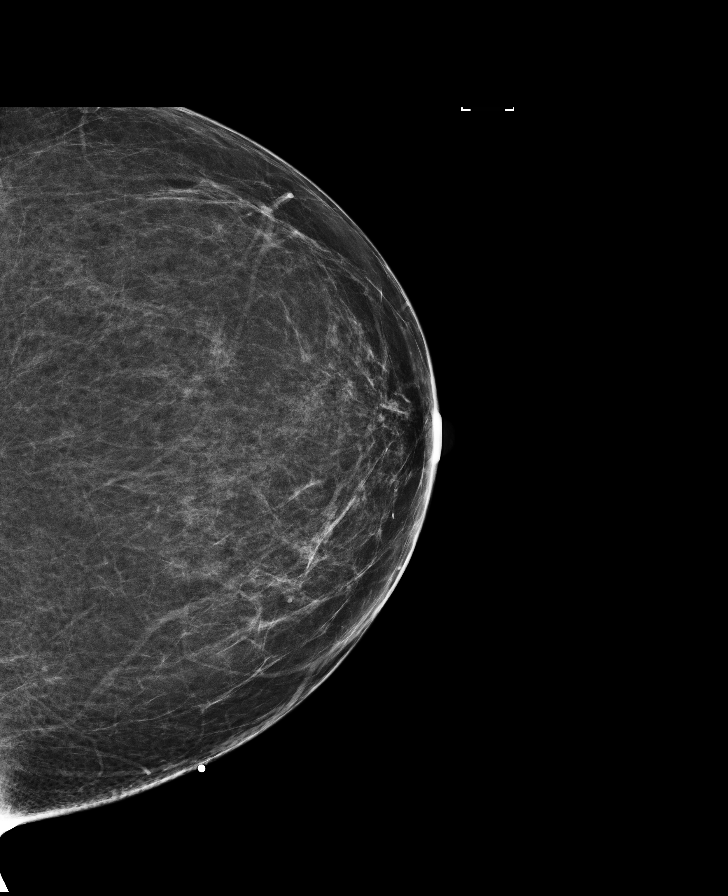

[R MLO]
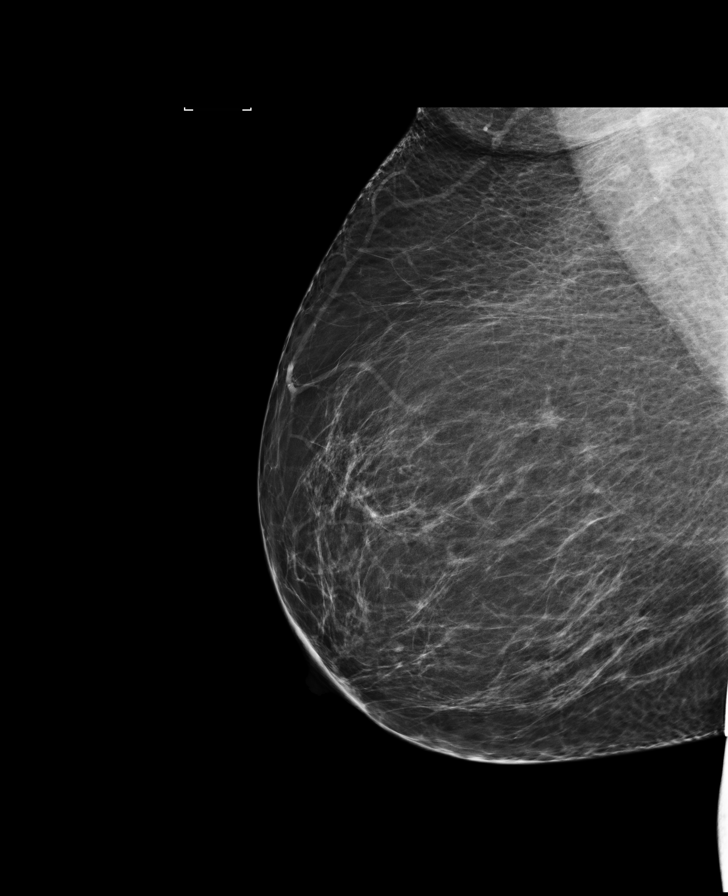

[R CC synth-2D]
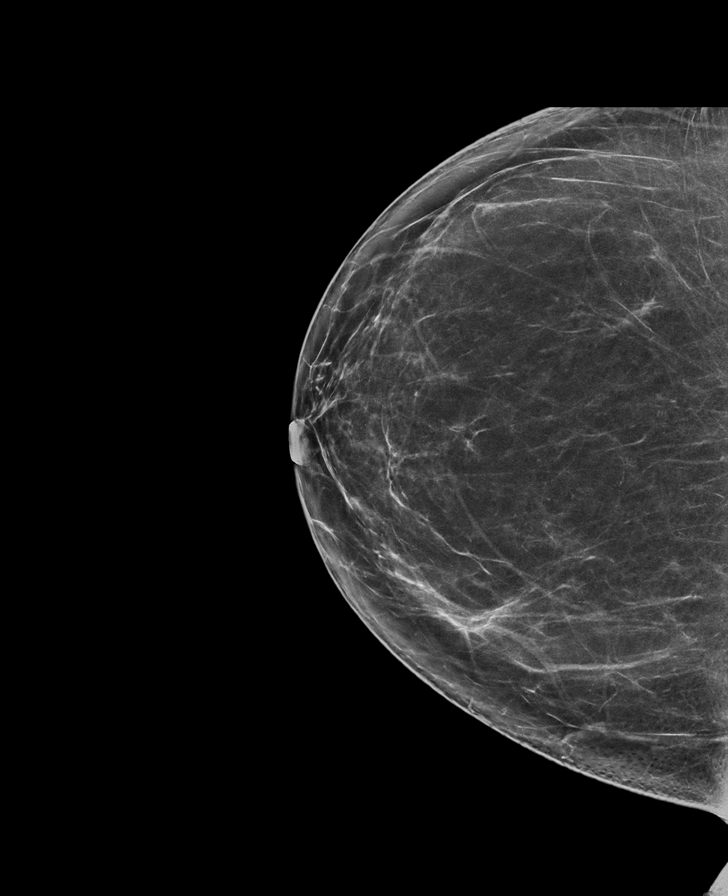

[L TAN synth-2D]
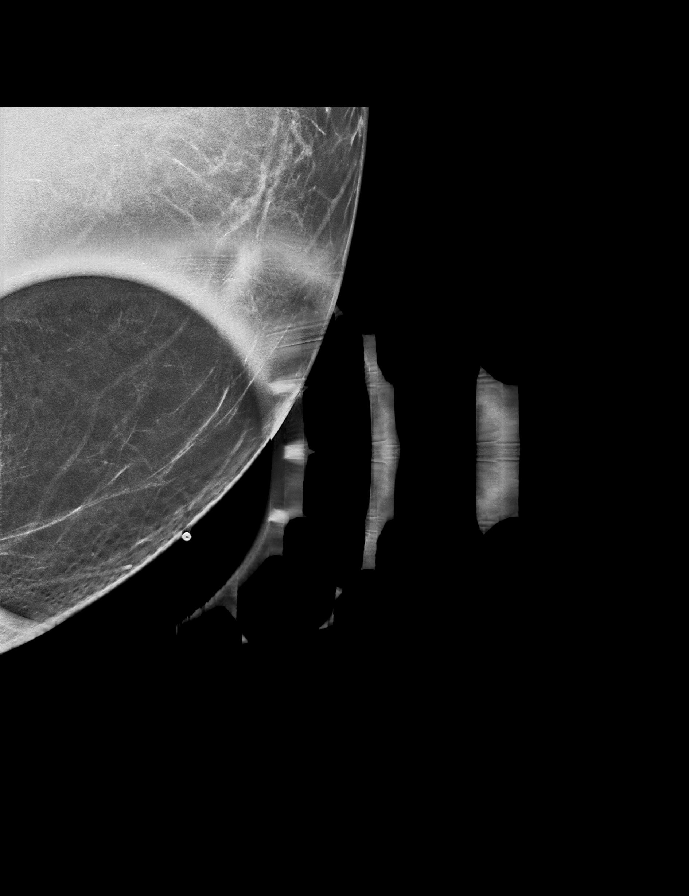

[L MLO]
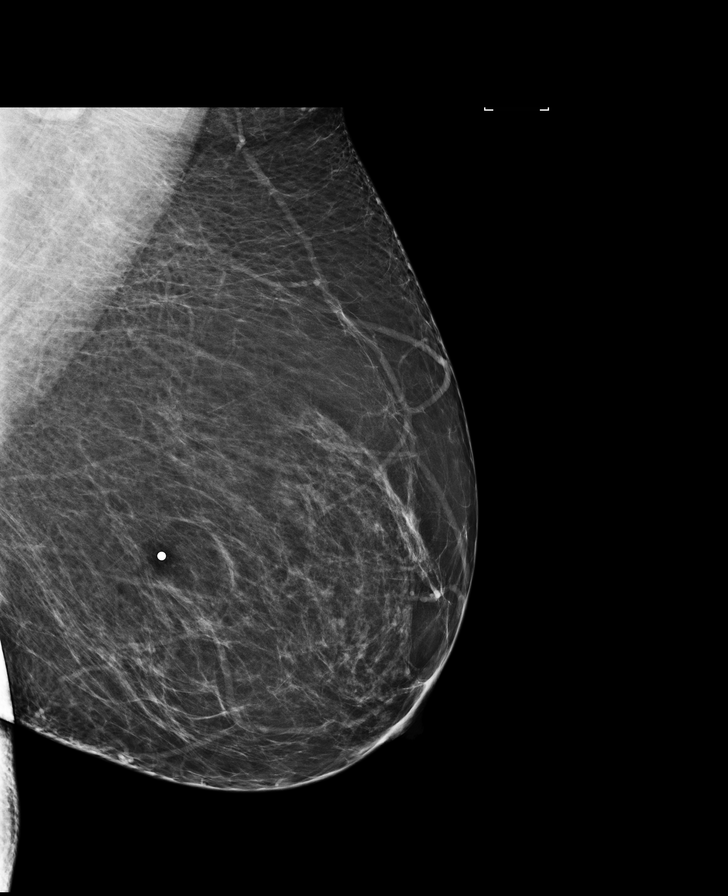

[R MLO synth-2D]
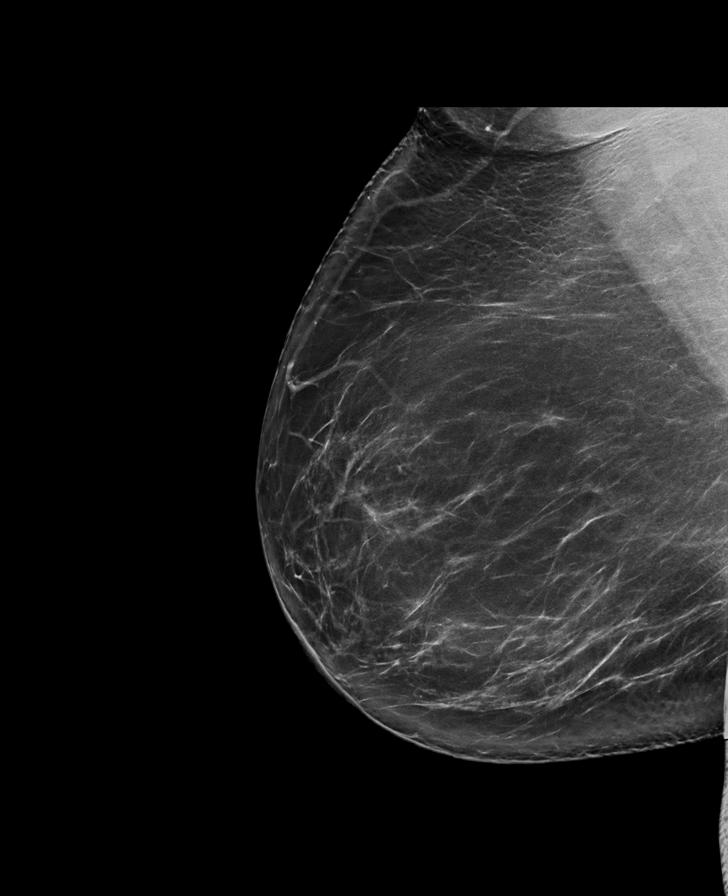

[L TAN]
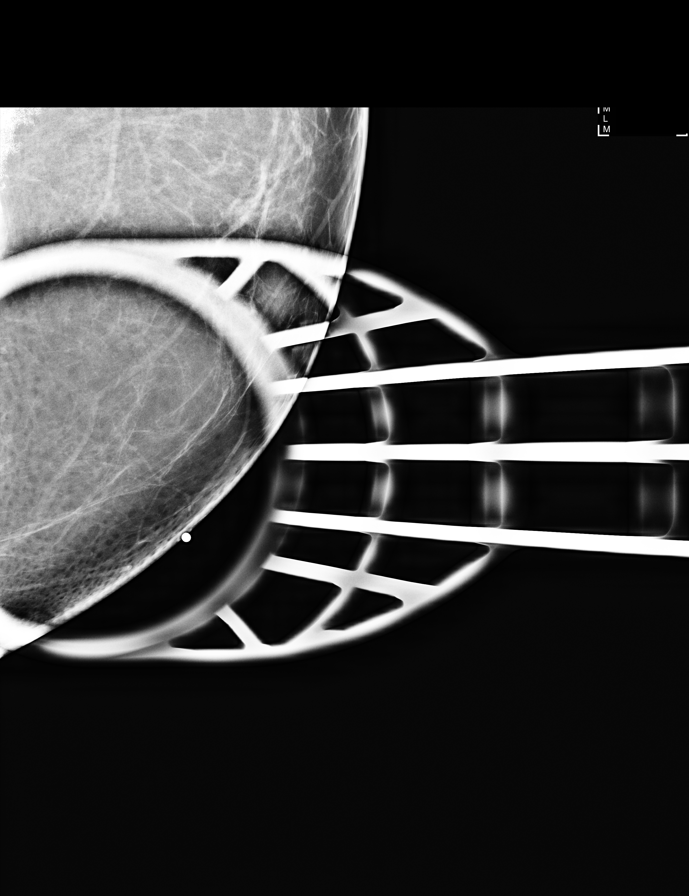

[8 of 35 positions shown; findings below may reference images not displayed]

FINDINGS: No suspicious masses or calcifications are seen in either breast.
Spot compression tangential tomograms were performed over the area
of pain in the inner left breast with no mammographic abnormality
seen.

Mammographic images were processed with CAD.

Physical examination in the region of pain in the inner left breast
does not reveal any palpable masses.

Targeted ultrasound of the left breast was performed. No discrete
masses or abnormalities seen, only normal appearing fibroglandular
tissue identified.
IMPRESSION: 1.  No findings of malignancy in either breast.

2. No mammographic or sonographic findings to explain the pain
involving the inner left breast.

RECOMMENDATION:
1. Recommend further management of the left breast pain be based on
clinical assessment.

2. Screening mammogram in one year.(Code:[id number]-[id number])

I have discussed the findings and recommendations with the patient.
Results were also provided in writing at the conclusion of the
visit. If applicable, a reminder letter will be sent to the patient
regarding the next appointment.

BI-RADS CATEGORY  1: Negative.

## 2019-02-09 DIAGNOSIS — G4733 Obstructive sleep apnea (adult) (pediatric): Secondary | ICD-10-CM | POA: Diagnosis not present

## 2019-02-26 DIAGNOSIS — G4733 Obstructive sleep apnea (adult) (pediatric): Secondary | ICD-10-CM | POA: Diagnosis not present

## 2019-02-26 DIAGNOSIS — K760 Fatty (change of) liver, not elsewhere classified: Secondary | ICD-10-CM | POA: Diagnosis not present

## 2019-02-26 DIAGNOSIS — E119 Type 2 diabetes mellitus without complications: Secondary | ICD-10-CM | POA: Diagnosis not present

## 2019-02-26 DIAGNOSIS — E669 Obesity, unspecified: Secondary | ICD-10-CM | POA: Diagnosis not present

## 2019-03-06 DIAGNOSIS — F329 Major depressive disorder, single episode, unspecified: Secondary | ICD-10-CM | POA: Diagnosis not present

## 2019-03-09 DIAGNOSIS — K760 Fatty (change of) liver, not elsewhere classified: Secondary | ICD-10-CM | POA: Diagnosis not present

## 2019-03-09 DIAGNOSIS — E669 Obesity, unspecified: Secondary | ICD-10-CM | POA: Diagnosis not present

## 2019-03-09 DIAGNOSIS — I10 Essential (primary) hypertension: Secondary | ICD-10-CM | POA: Diagnosis not present

## 2019-03-09 DIAGNOSIS — Z01818 Encounter for other preprocedural examination: Secondary | ICD-10-CM | POA: Diagnosis not present

## 2019-03-09 DIAGNOSIS — E119 Type 2 diabetes mellitus without complications: Secondary | ICD-10-CM | POA: Diagnosis not present

## 2019-03-31 ENCOUNTER — Other Ambulatory Visit: Payer: Self-pay | Admitting: Family Medicine

## 2019-04-06 ENCOUNTER — Other Ambulatory Visit: Payer: Self-pay | Admitting: Family Medicine

## 2019-04-06 DIAGNOSIS — Z1231 Encounter for screening mammogram for malignant neoplasm of breast: Secondary | ICD-10-CM

## 2019-04-07 ENCOUNTER — Other Ambulatory Visit: Payer: Self-pay

## 2019-04-07 ENCOUNTER — Ambulatory Visit
Admission: RE | Admit: 2019-04-07 | Discharge: 2019-04-07 | Disposition: A | Discharge status: Home / Self Care | Payer: BC Managed Care – PPO | Source: Ambulatory Visit | Attending: Family Medicine | Admitting: Family Medicine

## 2019-04-07 DIAGNOSIS — Z1231 Encounter for screening mammogram for malignant neoplasm of breast: Secondary | ICD-10-CM

## 2019-05-20 ENCOUNTER — Other Ambulatory Visit: Payer: Self-pay

## 2019-05-20 DIAGNOSIS — Z20822 Contact with and (suspected) exposure to covid-19: Secondary | ICD-10-CM

## 2019-05-21 LAB — NOVEL CORONAVIRUS, NAA: SARS-CoV-2, NAA: NOT DETECTED

## 2019-08-10 ENCOUNTER — Other Ambulatory Visit: Payer: Self-pay

## 2019-08-10 DIAGNOSIS — Z20822 Contact with and (suspected) exposure to covid-19: Secondary | ICD-10-CM

## 2019-08-11 LAB — SARS-COV-2, NAA 2 DAY TAT

## 2019-08-11 LAB — NOVEL CORONAVIRUS, NAA: SARS-CoV-2, NAA: NOT DETECTED

## 2019-12-25 ENCOUNTER — Other Ambulatory Visit: Payer: BC Managed Care – PPO

## 2019-12-25 DIAGNOSIS — Z20822 Contact with and (suspected) exposure to covid-19: Secondary | ICD-10-CM

## 2019-12-26 LAB — SARS-COV-2, NAA 2 DAY TAT

## 2019-12-26 LAB — NOVEL CORONAVIRUS, NAA: SARS-CoV-2, NAA: NOT DETECTED

## 2021-08-29 ENCOUNTER — Encounter: Payer: Self-pay | Admitting: *Deleted
